# Patient Record
Sex: Female | Born: 1935
Health system: Southern US, Community
[De-identification: ages and names within clinical notes are randomized; demographics above are authoritative.]

## PROBLEM LIST (undated history)

## (undated) DIAGNOSIS — E785 Hyperlipidemia, unspecified: Secondary | ICD-10-CM

## (undated) DIAGNOSIS — M1990 Unspecified osteoarthritis, unspecified site: Secondary | ICD-10-CM

## (undated) DIAGNOSIS — K5790 Diverticulosis of intestine, part unspecified, without perforation or abscess without bleeding: Secondary | ICD-10-CM

## (undated) DIAGNOSIS — K219 Gastro-esophageal reflux disease without esophagitis: Secondary | ICD-10-CM

## (undated) DIAGNOSIS — I10 Essential (primary) hypertension: Secondary | ICD-10-CM

## (undated) DIAGNOSIS — I2510 Atherosclerotic heart disease of native coronary artery without angina pectoris: Secondary | ICD-10-CM

## (undated) DIAGNOSIS — M549 Dorsalgia, unspecified: Secondary | ICD-10-CM

## (undated) DIAGNOSIS — K429 Umbilical hernia without obstruction or gangrene: Secondary | ICD-10-CM

## (undated) HISTORY — PX: APPENDECTOMY: SHX54

## (undated) HISTORY — PX: RHINOPLASTY: SUR1284

## (undated) HISTORY — PX: TONSILLECTOMY: SHX5217

## (undated) HISTORY — PX: TONSILLECTOMY AND ADENOIDECTOMY: SUR1326

## (undated) HISTORY — PX: ABDOMINOPLASTY: SUR9

## (undated) HISTORY — PX: DECOMPRESSIVE LUMBAR LAMINECTOMY LEVEL 1: SHX5791

## (undated) HISTORY — PX: REPLACEMENT TOTAL KNEE BILATERAL: SUR1225

## (undated) HISTORY — DX: Gastro-esophageal reflux disease without esophagitis: K21.9

## (undated) HISTORY — PX: ABDOMINAL HYSTERECTOMY: SHX81

## (undated) HISTORY — PX: JOINT REPLACEMENT: SHX530

## (undated) HISTORY — PX: DECOMPRESSIVE LUMBAR LAMINECTOMY LEVEL 4: SHX5794

## (undated) HISTORY — PX: HERNIA REPAIR: SHX51

---

## 1999-11-12 ENCOUNTER — Other Ambulatory Visit: Admission: RE | Admit: 1999-11-12 | Discharge: 1999-11-30 | Payer: Self-pay | Admitting: *Deleted

## 2000-12-13 ENCOUNTER — Encounter: Payer: Self-pay | Admitting: Internal Medicine

## 2000-12-13 ENCOUNTER — Emergency Department (HOSPITAL_COMMUNITY): Admission: EM | Admit: 2000-12-13 | Discharge: 2000-12-13 | Payer: Self-pay | Admitting: Internal Medicine

## 2002-09-09 ENCOUNTER — Encounter: Payer: Self-pay | Admitting: Internal Medicine

## 2002-09-09 ENCOUNTER — Encounter: Admission: RE | Admit: 2002-09-09 | Discharge: 2002-09-09 | Payer: Self-pay | Admitting: Internal Medicine

## 2004-03-24 ENCOUNTER — Ambulatory Visit (HOSPITAL_COMMUNITY): Admission: RE | Admit: 2004-03-24 | Discharge: 2004-03-24 | Payer: Self-pay | Admitting: Orthopedic Surgery

## 2004-08-25 ENCOUNTER — Inpatient Hospital Stay (HOSPITAL_COMMUNITY): Admission: RE | Admit: 2004-08-25 | Discharge: 2004-08-28 | Payer: Self-pay | Admitting: Orthopedic Surgery

## 2004-08-25 ENCOUNTER — Ambulatory Visit: Payer: Self-pay | Admitting: Physical Medicine & Rehabilitation

## 2004-08-28 ENCOUNTER — Inpatient Hospital Stay
Admission: RE | Admit: 2004-08-28 | Discharge: 2004-09-02 | Payer: Self-pay | Admitting: Physical Medicine & Rehabilitation

## 2006-09-26 ENCOUNTER — Other Ambulatory Visit: Admission: RE | Admit: 2006-09-26 | Discharge: 2006-09-26 | Payer: Self-pay | Admitting: Cardiology

## 2006-10-25 ENCOUNTER — Encounter: Admission: RE | Admit: 2006-10-25 | Discharge: 2006-10-25 | Payer: Self-pay | Admitting: Internal Medicine

## 2006-11-08 ENCOUNTER — Encounter: Admission: RE | Admit: 2006-11-08 | Discharge: 2006-11-08 | Payer: Self-pay | Admitting: Internal Medicine

## 2007-01-09 ENCOUNTER — Inpatient Hospital Stay (HOSPITAL_COMMUNITY): Admission: RE | Admit: 2007-01-09 | Discharge: 2007-01-11 | Payer: Self-pay | Admitting: Obstetrics and Gynecology

## 2007-01-09 ENCOUNTER — Encounter (INDEPENDENT_AMBULATORY_CARE_PROVIDER_SITE_OTHER): Payer: Self-pay | Admitting: Specialist

## 2009-02-18 ENCOUNTER — Encounter: Admission: RE | Admit: 2009-02-18 | Discharge: 2009-02-18 | Payer: Self-pay | Admitting: Internal Medicine

## 2010-12-02 ENCOUNTER — Other Ambulatory Visit: Payer: Self-pay | Admitting: Orthopedic Surgery

## 2010-12-02 ENCOUNTER — Ambulatory Visit (HOSPITAL_COMMUNITY)
Admission: RE | Admit: 2010-12-02 | Discharge: 2010-12-02 | Disposition: A | Discharge status: Home / Self Care | Payer: Medicare Other | Source: Ambulatory Visit | Attending: Orthopedic Surgery | Admitting: Orthopedic Surgery

## 2010-12-02 ENCOUNTER — Other Ambulatory Visit (HOSPITAL_COMMUNITY): Payer: Self-pay | Admitting: Orthopedic Surgery

## 2010-12-02 ENCOUNTER — Encounter (HOSPITAL_COMMUNITY): Payer: Medicare Other

## 2010-12-02 DIAGNOSIS — Z01818 Encounter for other preprocedural examination: Secondary | ICD-10-CM | POA: Insufficient documentation

## 2010-12-02 DIAGNOSIS — I10 Essential (primary) hypertension: Secondary | ICD-10-CM | POA: Insufficient documentation

## 2010-12-02 DIAGNOSIS — Z01812 Encounter for preprocedural laboratory examination: Secondary | ICD-10-CM | POA: Insufficient documentation

## 2010-12-02 DIAGNOSIS — Z87891 Personal history of nicotine dependence: Secondary | ICD-10-CM | POA: Insufficient documentation

## 2010-12-02 LAB — CBC
Hemoglobin: 12.8 g/dL (ref 12.0–15.0)
MCH: 30.3 pg (ref 26.0–34.0)
Platelets: 307 10*3/uL (ref 150–400)
RBC: 4.23 MIL/uL (ref 3.87–5.11)
WBC: 6 10*3/uL (ref 4.0–10.5)

## 2010-12-02 LAB — URINE MICROSCOPIC-ADD ON

## 2010-12-02 LAB — COMPREHENSIVE METABOLIC PANEL
ALT: 13 U/L (ref 0–35)
AST: 16 U/L (ref 0–37)
Albumin: 4.3 g/dL (ref 3.5–5.2)
CO2: 27 mEq/L (ref 19–32)
Calcium: 10.4 mg/dL (ref 8.4–10.5)
Creatinine, Ser: 0.96 mg/dL (ref 0.4–1.2)
GFR calc Af Amer: >60 mL/min (ref >60)
Sodium: 136 mEq/L (ref 135–145)
Total Protein: 7.3 g/dL (ref 6.0–8.3)

## 2010-12-02 LAB — APTT: aPTT: 28 seconds (ref 24–37)

## 2010-12-02 LAB — PROTIME-INR
INR: 0.95 (ref 0.00–1.49)
Prothrombin Time: 12.9 seconds (ref 11.6–15.2)

## 2010-12-02 LAB — URINALYSIS, ROUTINE W REFLEX MICROSCOPIC
Glucose, UA: NEGATIVE mg/dL
Hgb urine dipstick: NEGATIVE
Protein, ur: NEGATIVE mg/dL
pH: 5 (ref 5.0–8.0)

## 2010-12-13 ENCOUNTER — Inpatient Hospital Stay (HOSPITAL_COMMUNITY)
Admission: RE | Admit: 2010-12-13 | Discharge: 2010-12-16 | DRG: 470 | Disposition: A | Discharge status: Skilled Nursing Facility | Payer: Medicare Other | Source: Ambulatory Visit | Attending: Orthopedic Surgery | Admitting: Orthopedic Surgery

## 2010-12-13 DIAGNOSIS — E785 Hyperlipidemia, unspecified: Secondary | ICD-10-CM | POA: Diagnosis present

## 2010-12-13 DIAGNOSIS — Z01812 Encounter for preprocedural laboratory examination: Secondary | ICD-10-CM

## 2010-12-13 DIAGNOSIS — K219 Gastro-esophageal reflux disease without esophagitis: Secondary | ICD-10-CM | POA: Diagnosis present

## 2010-12-13 DIAGNOSIS — E876 Hypokalemia: Secondary | ICD-10-CM | POA: Diagnosis not present

## 2010-12-13 DIAGNOSIS — D62 Acute posthemorrhagic anemia: Secondary | ICD-10-CM | POA: Diagnosis not present

## 2010-12-13 DIAGNOSIS — E669 Obesity, unspecified: Secondary | ICD-10-CM | POA: Diagnosis present

## 2010-12-13 DIAGNOSIS — Z96659 Presence of unspecified artificial knee joint: Secondary | ICD-10-CM

## 2010-12-13 DIAGNOSIS — I10 Essential (primary) hypertension: Secondary | ICD-10-CM | POA: Diagnosis present

## 2010-12-13 DIAGNOSIS — M1710 Unilateral primary osteoarthritis, unspecified knee: Principal | ICD-10-CM | POA: Diagnosis present

## 2010-12-13 DIAGNOSIS — E871 Hypo-osmolality and hyponatremia: Secondary | ICD-10-CM | POA: Diagnosis not present

## 2010-12-13 LAB — ABO/RH: ABO/RH(D): O POS

## 2010-12-13 LAB — TYPE AND SCREEN
ABO/RH(D): O POS
Antibody Screen: NEGATIVE

## 2010-12-14 LAB — CBC
MCV: 90 fL (ref 78.0–100.0)
Platelets: 235 10*3/uL (ref 150–400)
RBC: 3.41 MIL/uL — ABNORMAL LOW (ref 3.87–5.11)
RDW: 13.1 % (ref 11.5–15.5)
WBC: 6.1 10*3/uL (ref 4.0–10.5)

## 2010-12-14 LAB — BASIC METABOLIC PANEL
BUN: 12 mg/dL (ref 6–23)
Chloride: 100 mEq/L (ref 96–112)
Creatinine, Ser: 0.71 mg/dL (ref 0.4–1.2)
GFR calc Af Amer: >60 mL/min (ref >60)
GFR calc non Af Amer: >60 mL/min (ref >60)
Potassium: 4.1 mEq/L (ref 3.5–5.1)

## 2010-12-15 DIAGNOSIS — M1710 Unilateral primary osteoarthritis, unspecified knee: Secondary | ICD-10-CM

## 2010-12-15 DIAGNOSIS — Z96659 Presence of unspecified artificial knee joint: Secondary | ICD-10-CM

## 2010-12-15 LAB — BASIC METABOLIC PANEL
Chloride: 96 mEq/L (ref 96–112)
GFR calc non Af Amer: >60 mL/min (ref >60)
Potassium: 3.3 mEq/L — ABNORMAL LOW (ref 3.5–5.1)
Sodium: 132 mEq/L — ABNORMAL LOW (ref 135–145)

## 2010-12-15 LAB — CBC
HCT: 27.3 % — ABNORMAL LOW (ref 36.0–46.0)
MCV: 89.5 fL (ref 78.0–100.0)
Platelets: 216 10*3/uL (ref 150–400)
RBC: 3.05 MIL/uL — ABNORMAL LOW (ref 3.87–5.11)
RDW: 12.8 % (ref 11.5–15.5)
WBC: 6.5 10*3/uL (ref 4.0–10.5)

## 2010-12-15 NOTE — H&P (Addendum)
[name], [name]               ACCOUNT NO.:  [id number]  MEDICAL RECORD NO.:  [id number]           PATIENT TYPE:  I  LOCATION:  1604                         FACILITY:  [facility]  PHYSICIAN:  Ollen Gross, M.D.    DATE OF BIRTH:  March 01, 1935  DATE OF ADMISSION:  12/13/2010 DATE OF DISCHARGE:                             HISTORY & PHYSICAL   CHIEF COMPLAINT:  Left knee pain.  BRIEF HISTORY:  Cindy Patterson has been followed by Dr. Lequita Halt for worsening pain in her left knee.  Cindy Patterson has previously had a right total knee arthroplasty several years ago and Cindy Patterson is doing great with that. Cindy Patterson has had multiple injections in the left knee by Dr. Penni Bombard.  They helped initially, but unfortunately they started to wear off and Cindy Patterson is no longer getting any relief from injections.  Cindy Patterson is having increased pain and this is limiting what Cindy Patterson is able to do.  Cindy Patterson now presents for a left total knee arthroplasty.  ALLERGIES:  CODEINE.  Cindy Patterson has sensitivity, this causes nausea and vomiting.  PRIMARY CARE PHYSICIAN:  Georgianne Fick, MD.  CURRENT MEDICATIONS: 1. Lisinopril hydrochlorothiazide. 2. Lipitor. 3. Vitamin D. 4. Calcium. 5. Aleve. 6. Advil. 7. Tramadol. 8. Omeprazole.  Please note Cindy Patterson only takes Aleve or Advil, does not     take them together.  PAST MEDICAL HISTORY: 1. End-stage arthritis of the left knee. 2. Hypertension. 3. Hyperlipidemia. 4. Reflux disease. 5. Arthritis.  PAST SURGICAL HISTORY: 1. Tonsillectomy and adenoidectomy. 2. Appendectomy. 3. Rhinoplasty. 4. Abdominoplasty. 5. Right knee arthroscopy x2. 6. Right total knee arthroplasty. 7. Bladder tack. 8. Lumbar decompression.  FAMILY HISTORY:  Father passed at the age of 64, he had a myocardial infarction.  Mother passed at the age of 13, Cindy Patterson had COPD.  SOCIAL HISTORY:  The patient is widowed.  Cindy Patterson works as a Engineer, civil (consulting).  Cindy Patterson admits to past use of tobacco products.  Cindy Patterson has 1-2 glasses of wine daily.  Cindy Patterson has 3  children, 2 living.  Cindy Patterson plans to go to [facility] following her hospital stay.  REVIEW OF SYSTEMS:  GENERAL:  Negative for fevers, chills or weight change.  HEENT:  NEURO:  Negative for headache, blackout spells or insomnia.  DERMATOLOGIC:  Negative for rash or lesion.  RESPIRATORY: Negative for shortness of breath at rest or with exertion. CARDIOVASCULAR:  Negative for chest pain or palpitations.  GI:  Negative for nausea, vomiting or diarrhea.  GU:  Negative hematuria or dysuria. MUSCULOSKELETAL:  Positive for joint pain and joint swelling.  Ms. Selke has been cleared for surgery by Dr. Nicholos Johns.  PHYSICAL EXAMINATION:  VITAL SIGNS:  Pulse 80, respirations 18, blood pressure 138/82 in the left arm. GENERAL:  [name] is alert and oriented x3.  Cindy Patterson is well developed and well nourished, in no apparent distress.  Cindy Patterson is a pleasant 76 year old female.  Cindy Patterson has a stated height of 5 feet 3-1/2 inches and a stated weight of 219 pounds. HEENT:  Normocephalic, atraumatic.  Extraocular movements intact. NECK:  Supple.  Full range of motion without lymphadenopathy. CHEST:  Lungs are clear to  auscultation bilaterally. HEART:  Regular rate and rhythm without murmur. ABDOMEN:  Bowel sounds present in all 4 quadrants. EXTREMITIES:  Left knee negative for effusion, varus deformity.  Range is 5-110 degrees.  There is marked crepitus throughout the range.  No instability is noted. SKIN:  Unremarkable. NEUROLOGIC:  Intact. PERIPHERAL VASCULAR:  Carotid pulses 2+ bilaterally without bruit.  RADIOGRAPHY:  AP and lateral views of the patient's left knee reveal advanced end-stage arthritis tricompartmentally.  IMPRESSION:  Advanced end-stage arthritis tricompartmentally.  PLAN:  Left total knee arthroplasty to be performed by Dr. Lequita Halt.     Rozell Searing, PAC   ______________________________ Ollen Gross, M.D.   LD/MEDQ  D:  12/15/2010  T:  12/15/2010  Job:   [id number]  Electronically Signed by Ollen Gross M.D. on 12/15/2010 09:53:40 AM Electronically Signed by Rozell Searing  on 12/16/2010 08:37:41 AM

## 2010-12-15 NOTE — Op Note (Signed)
Cindy Patterson, Cindy Patterson               ACCOUNT NO.:  [id number]  MEDICAL RECORD NO.:  [id number]           PATIENT TYPE:  I  LOCATION:  1604                         FACILITY:  [facility]  PHYSICIAN:  Ollen Gross, M.D.    DATE OF BIRTH:  1935/01/26  DATE OF PROCEDURE:  12/13/2010 DATE OF DISCHARGE:                              OPERATIVE REPORT   PREOPERATIVE DIAGNOSIS:  Osteoarthritis, left knee.  POSTOPERATIVE DIAGNOSIS:  Osteoarthritis, left knee.  PROCEDURE:  Left total knee arthroplasty.  SURGEON:  Ollen Gross, MD  ASSISTANT:  Alexzandrew L. Perkins, PA-C  ANESTHESIA:  Spinal.  ESTIMATED BLOOD LOSS:  Minimal.  DRAIN:  Hemovac x1.  TOURNIQUET TIME:  35 minutes at 300 mmHg.  COMPLICATIONS:  None.  CONDITION:  Stable to Recovery.  BRIEF CLINICAL NOTE:  Cindy Patterson is a 76 year old female with advanced end- stage arthritis of the left knee with progressively worsening pain and dysfunction.  She has had a previous successful right total knee arthroplasty and she presents now for left total knee arthroplasty.  PROCEDURE IN DETAIL:  After successful administration of spinal anesthetic, a tourniquet was placed high on her left thigh and her left lower extremity was prepped and draped in usual sterile fashion. Extremity was wrapped in an Esmarch, knee flexed, tourniquet inflated to 300 mmHg.  Midline incision was made with a 10 blade through a subcutaneous tissue to the level of the extensor mechanism.  A fresh blade was used to make a medial parapatellar arthrotomy.  Soft tissue on the proximal medial tibia was subperiosteally elevated to the joint line with a knife and into the semimembranosus bursa with a Cobb elevator. Soft tissue laterally was elevated with attention being paid to avoid any patellar tendon on tibial tubercle.  The patella was everted, knee flexed to 90 degrees, and ACL and PCL removed.  Drill was used to create a starting hole in the distal femur and the  canal was thoroughly irrigated.  The 5-degree left valgus alignment guide was placed.  Distal femoral cutting block was then pinned to remove 11 mm of the distal femur.  Resection was made with an oscillating saw.  Tibia subluxed forward and menisci removed.  Extramedullary tibial alignment guide was placed referencing proximally at the medial aspect of the tibial tubercle and distally along the second metatarsal axis and tibial crest.  Block was pinned to remove 2 mm off the more deficient medial side.  Tibial resection was made with an oscillating saw.  Size 3 was the most appropriate tibial component and proximal tibia prepared with a modular drill and keel punched for the size 3.  Femoral sizing guide was placed, size 4 narrow was most appropriate. The cutting block was placed with the rotation marked in the epicondylar axis and also by creating rectangular flexion gap at 90 degrees.  The block was pinned and the anterior and posterior chamfer cuts were made. Intercondylar block was placed and that cut was made.  Trial size 4 narrow posterior stabilized femur was placed.  A 10-mm posterior stabilized rotating platform insert trial was placed.  There was a tiny bit of varus-valgus play,  so I went to a 12.5 which was allowed for full extension with excellent varus-valgus and anterior-posterior stability, throughout full range of motion.  The patella was then everted, thickness measured to be 22 mm.  Freehand resection taken to 12 mm, 38 template was placed, lug holes were drilled, trial patella was placed and it tracked normally.  Osteophytes were removed off the posterior femur with the trial in place.  All trials were removed and the cut bone surfaces were prepared with pulsatile lavage.  Cement was mixed and once ready for implantation, the size 3 mobile-bearing tibial tray, size 4 narrow posterior stabilized femur, and 38 patella were cemented into place and patella was held with  a clamp.  Trial 12.5-mm insert was placed, knee held in full extension, all extruded cement removed.  When the cement was fully hardened, then the permanent 12.5-mm posterior stabilized rotating platform insert was placed into the tibial tray. Wound was copiously irrigated with saline solution and then the arthrotomy closed over Hemovac drain with interrupted #1 PDS.  Flexion against gravity was 135 degrees.  Patella tracked normally. Tourniquet was released at total time of 35 minutes.  Subcutaneous was closed with interrupted 2-0 Vicryl and subcuticular with running 4-0 Monocryl. Catheter for the Marcaine pain pump was placed and the pump was initiated.  Incision was cleaned and dried and Steri-Strips and a bulky sterile dressing were applied.  She was then placed into a knee immobilizer, awakened, and transported to Recovery in stable condition.     Ollen Gross, M.D.     FA/MEDQ  D:  12/13/2010  T:  12/14/2010  Job:  [id number]  Electronically Signed by Ollen Gross M.D. on 12/15/2010 09:53:37 AM

## 2010-12-16 LAB — CBC
HCT: 26.4 % — ABNORMAL LOW (ref 36.0–46.0)
Hemoglobin: 8.8 g/dL — ABNORMAL LOW (ref 12.0–15.0)
MCV: 89.5 fL (ref 78.0–100.0)
WBC: 6.3 10*3/uL (ref 4.0–10.5)

## 2010-12-16 LAB — BASIC METABOLIC PANEL
BUN: 7 mg/dL (ref 6–23)
CO2: 28 mEq/L (ref 19–32)
Chloride: 100 mEq/L (ref 96–112)
Glucose, Bld: 104 mg/dL — ABNORMAL HIGH (ref 70–99)
Potassium: 3.8 mEq/L (ref 3.5–5.1)
Sodium: 132 mEq/L — ABNORMAL LOW (ref 135–145)

## 2010-12-27 NOTE — Discharge Summary (Signed)
Cindy Patterson, Cindy Patterson               ACCOUNT NO.:  [id number]  MEDICAL RECORD NO.:  [id number]           PATIENT TYPE:  I  LOCATION:  1604                         FACILITY:  [facility]  PHYSICIAN:  Ollen Gross, M.D.    DATE OF BIRTH:  [birth year]  DATE OF ADMISSION:  12/13/2010 DATE OF DISCHARGE:  12/16/2010                        DISCHARGE SUMMARY - REFERRING   ADMITTING DIAGNOSES: 1. Osteoarthritis, left knee. 2. Hypertension. 3. Hyperlipidemia. 4. Reflux. 5. Osteoarthritis.  DISCHARGE DIAGNOSES: 1. Osteoarthritis, left knee status post left total knee replacement     arthroplasty. 2. Postop acute blood loss anemia did not require transfusion. 3. Mild postop hyponatremia. 4. Mild postop hypokalemia. 5. Osteoarthritis, left knee. 6. Hypertension. 7. Hyperlipidemia. 8. Reflux. 9. Osteoarthritis.  PROCEDURE:  On December 13, 2010, left total knee.  SURGEON:  Dr. Lequita Halt.  ASSISTANT:  Alexzandrew L. Perkins, P.A.C.  ANESTHESIA:  Spinal anesthesia.  CONSULTATIONS:  [facility].  BRIEF HISTORY:  Cindy Patterson is a 76 year old female with advanced end-stage arthritis of left knee, progressive worsening pain dysfunction, and successful right total knee now presents for a left total knee.  LABORATORY DATA:  Preop CBC showed a hemoglobin of 12.8, hematocrit of 38.7, white cell count of 6.0, and platelets of 307.  PT/INR 12.9 and 0.95 with a PTT of 28.  Chem panel on admission minimally elevated BUN of 28.  Remaining Chem panel all within normal limits.  Preop UA small leukocytes, rare squamous, and 0 to 2 white cells.  Blood group type O+. Nasal swabs were positive for Staphylococcus aureus, but negative for MRSA.  Serial CBCs were followed throughout the hospital course. Hemoglobin dropped down to 10.1 to 9.  Last noted hemoglobin and hematocrit was 8.8 and 26.4.  Serial BMET were followed.  Sodium dropped from 136 to 132 were stabilized that was last noted at  132, potassium dropped from 4.2 to 3.3 was back up to 3.8 with potassium supplements. Remaining electrolytes remained within normal limits.  EKG on the chart dated 2012, cannot make out the month, sinus rhythm, poor R wave progression probably normal variant, no major changes.  HOSPITAL COURSE:  The patient admitted to [facility] and taken to OR, underwent above-stated procedure without complication.  The patient tolerated the procedure well, later transferred to the recovery room on 5th floor, started on p.o. and IV analgesics, pain control following surgery, and doing fairly well on the morning of day one, had a good urinary output.  Sodium was a little low, felt to be more of a dilutional component.  She was on a fluid pill for blood pressure control also and her initial sodium was only 136 on the lower level. She wanted to look into a skilled facility versus a [facility] so we got a [facility].  The patient was seen on postop day #2 by rehab services and felt that she did not meet the medical necessity for the inpatient rehab and felt she would be a good skilled facility patient.  We had the social work also involved to assist with placement of the patient by day  2.  Her hemoglobin was down a little bit.  She was asymptomatic with this hemoglobin of 9.  She was started on iron supplementation.  It was also noted that the potassium was a little low and so we put her on some potassium supplements. Dressing was changed.  Incision looked good.  She did well, progressing, and by day 3 potassium was back up.  Her sodium was stable.  It was noted that a bed should be available over at [facility] on 12/16/2010. Arrangement should be made.  She will transfer by that time.  DISCHARGE PLAN: 1. The patient is transferred over to [facility] on 12/16/2010. 2. Discharge diagnoses please see above. 3. Discharge medications and current medications  at time of transfer     include:  MEDICATIONS: 1. Nu-Iron 150 mg p.o. daily for 3 weeks and discontinue the Nu-Iron. 2. Lipitor 10 mg at bedtime. 3. Hydrochlorothiazide 25 mg every morning. 4. Xarelto 10 mg daily for 2 weeks then discontinue the Xarelto. 5. Colace 100 mg p.o. b.i.d. 6. Omeprazole 20 mg every morning. 7. Tylenol 325 one or two every 4 to 6 hours as needed for mild pain,     temperature, or headache. 8. Laxative of choice. 9. Enema of choice. 10.Robaxin 500 mg p.o. q.6-8 h. p.r.n. spasm. 11.Restoril 15 mg 1 or 2 every hour or p.r.n., sleep insomnia. 12.Percocet 5 mg 1 or 2 every 4-6 hours as needed for moderate pain.  DIET:  Heart-healthy diet.  ACTIVITY:  She is a left total knee arthroplasty.  She is weightbearing as tolerated for total knee protocol.  PT and OT for gait training, ambulation, ADLs, range of motion, and strengthening exercises.  Please note the patient may start showering, however, do not submerge the incision under water, daily dressing changed to the knee.  FOLLOWUP:  She needs to follow up with Dr. Lequita Halt in the office approximately 2 weeks from the date of surgery either on May 1st on Tuesday or May 3rd on Thursday, please contact the office at [phone] and help arrange appointment followup care of this patient.  DISPOSITION:  [facility].  CONDITION ON DISCHARGE:  Improved.     Alexzandrew L. Julien Girt, P.A.C.   ______________________________ Ollen Gross, M.D.    ALP/MEDQ  D:  12/16/2010  T:  12/16/2010  Job:  [id number]  cc:   Georgianne Fick, M.D. Fax: [phone]  Electronically Signed by Patrica Duel P.A.C. on 12/16/2010 11:22:12 AM Electronically Signed by Ollen Gross M.D. on 12/27/2010 07:11:05 AM

## 2011-01-14 NOTE — Discharge Summary (Signed)
NAMEMARELLY, WEHRMAN NO.:  [id number]   MEDICAL RECORD NO.:  [id number]          PATIENT TYPE:  ORB   LOCATION:  4529                         FACILITY:  [facility]   PHYSICIAN:  Ranelle Oyster, M.D.DATE OF BIRTH:  [birth year]   DATE OF ADMISSION:  08/28/2004  DATE OF DISCHARGE:  09/02/2004                                 DISCHARGE SUMMARY   DISCHARGE DIAGNOSES:  1.  Right total knee replacement.  2.  Postoperative anemia.  3.  Hypertension.   HISTORY OF PRESENT ILLNESS:  Ms. Kriegel is a 76 year old female with history  of hypertension, GERD, OA of right knee with end-stage changes.  She elected  to undergo right total knee replacement on December 28, but Dr. Lequita Halt with  weightbearing as tolerated and was started on Coumadin for DVT prophylaxis.  Therapies initiated and the patient has been progressing along.  She is  currently moderate assist for bed mobility and transfer minimal assist  ambulating 3 feet with a rolling walker.  She requires assist with upper  body care, moderate to maximum assist for lower body care.  SACU was  consulted of progress.   PAST MEDICAL HISTORY:  1.  Hypertension.  2.  Right knee scope in 1966, and July 2005.  3.  Rhinoplasty.  4.  Appendectomy.  5.  Gastroesophageal reflux disease.  6.  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   ALLERGIES:  CODEINE.   SOCIAL HISTORY:  The patient lives with family in one-level home with two  steps at entry.  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  She does not have tobacco  abuse and alcohol occasionally.  She continues to work on a part-time basis.   HOSPITAL COURSE:  Ms. Gullickson was admitted to subacute on August 28, 2004,  for SACU level therapies to consist of PT/OT daily.  Past admission, subcu  Lovenox was added and INR was therapeutic at 1.3.  Blood pressures were  monitored as the patient was on hydrochlorothiazide and Prinivil.  BPs were  noted to be running on the low side with systolics 100-110  and her  lisinopril was cut down to 10 mg a day.  Labs done past admission revealed  sodium 131, potassium 3.9, chloride 97, CO2 26, BUN 19, creatinine 0.9,  glucose 123.  Check of CBC with hemoglobin 10.9, hematocrit 31.6, white  count 6.6, platelets 355.   The patient's right knee incision has been monitored along.  It has been  healing well without any signs or symptoms of infection.  It is intact  without any drainage.  The patient has tolerated in her therapies and has  progressed along well.  At the time of discharge, the patient is at modified  independent levels for transfers, modified independent levels for ambulating  200 feet with rolling walker.  She has supervision car transfers.  She is  modified independent for ADLs.  Further followup therapies to include home  health, PT/OT by [facility].  Home health R.N. has been  arranged for next protime draw on January 9, with Coumadin to continue to  September 25, 2004.  On September 02, 2004, the patient is discharged to home.   DISCHARGE MEDICATIONS:  1.  Coumadin 2.5 mg alternating with 5 mg every other day.  2.  Prinivil 10 mg a day.  3.  Lipitor 10 mg q.h.s.  4.  Hydrochlorothiazide 25 mg a day.  5.  Robaxin 500 mg q.i.d. p.r.n. spasms.  6.  Oxycodone 5-10 mg q.4-6h. p.r.n. pain.   ACTIVITY:  Use walker.   DIET:  Regular.   WOUND CARE:  Wash with soap and water.  Keep clean and dry.   SPECIAL INSTRUCTIONS:  No alcohol, no smoking and no driving.   FOLLOW UP:  The patient is to follow up with Dr. Lequita Halt in 2 weeks.  Follow  up with Dr. Nicholos Johns.  Follow up with Dr. Riley Kill as needed.      Pame   PP/MEDQ  D:  09/02/2004  T:  09/02/2004  Job:  [id number]   cc:   Ollen Gross, M.D.  [facility]  [address]  [address]  [place]  [place] [address]  Fax: [phone]   Georgianne Fick, M.D.  [address]  [place]  [place] [address]  Fax: [phone]

## 2011-01-14 NOTE — Op Note (Signed)
Cindy Patterson, Cindy Patterson NO.:  [id number]   MEDICAL RECORD NO.:  [id number]          PATIENT TYPE:  INP   LOCATION:  0005                         FACILITY:  [facility]   PHYSICIAN:  Ollen Gross, M.D.    DATE OF BIRTH:  August 10, 1935   DATE OF PROCEDURE:  08/25/2004  DATE OF DISCHARGE:                                 OPERATIVE REPORT   PREOPERATIVE DIAGNOSIS:  Osteoarthritis, right knee.   POSTOPERATIVE DIAGNOSIS:  Osteoarthritis, right knee.   PROCEDURE:  Right total knee arthroplasty.   SURGEON:  Ollen Gross, M.D.   ASSISTANT:  Ranee Gosselin, MD.   ANESTHESIA:  Spinal.   ESTIMATED BLOOD LOSS:  Minimal.   DRAINS:  Hemovac x1.   TOURNIQUET TIME:  Sixty minutes at 300 mmHg.   COMPLICATIONS:  None.   CONDITION:  Stable to the recovery room.   CLINICAL NOTE:  Cindy Patterson is a 76 year old female who has severe end-stage  arthritis of the right knee with intractable pain.  She presents now for  right total knee arthroplasty.   PROCEDURE IN DETAIL:  After successful administration of spinal anesthetic,  a tourniquet is placed high on her right thigh and her right lower extremity  prepped and draped in the usual sterile fashion.  The extremity is wrapped  in esmarch, the knee flexed, and the tourniquet inflated to 300 mmHg.  A  standard midline incision was made with a 10 blade through the subcutaneous  tissue to the level of the extensor mechanism.  A fresh blade is used to  make a medial parapatellar arthrotomy, and the soft tissue over the proximal  medial tibia is subperiosteally elevated to the joint line with a knife and  into the semimembranosus bursa with a Cobb elevator.  The soft tissue over  the proximal lateral tibia is also elevated with attention being paid to  avoid the patellar tendon on the tibial tubercle.  The patella is everted,  and the knee flexed to 90 degrees.  ACL and PCL were removed.  The drill is  used to create a starting hole, and the  distal femoral canal is irrigated.  A 5 degree right valgus alignment guide is placed and referencing off the  posterior condyle, rotation is marked and the knee blocked, pinned to remove  10 mm off the distal femur.  Distal femoral resection is made with an  oscillating saw.  A sizing block is placed, and size 4 is most appropriate  for the femur.  Rotation is marked off the epicondylar axis.  A size 4  cutting block is placed.  The anterior and posterior chamfer cuts are made.   The tibia is subluxed forward, and the menisci are removed.  An  extramedullary tibial alignment guide is placed, referencing proximally at  the medial aspect of the tibial tubercle and distally along the second  metatarsal axis and tibial crest.  A block is pinned to remove 10 mg off the  nondeficient lateral side.  Tibial resection is made with an oscillating  saw.  The size 3 is the most appropriate tibial component, and the  proximal  tibia is prepared with a modular drill and keel punch for a size 3.  Femoral  preparation is completed with the intercondylar cut and chamfer cuts.   A size 4 posterior stabilized femoral trial with a size 3 mobile-bearing  tibial trial and a 10 mm posterior stabilized rotating platform insert trial  was placed.  With the 10, full extension is achieved with excellent varus  and valgus balance throughout full range of motion.  The patella is then  everted, and thickness measured to be 23 mm.  Free-hand resection is taken  to 14 mm.  A 38 template is placed.  Lug holes are drilled.  The trial  patella is placed, and it tracks normally.  The osteophytes are then removed  off the posterior femur with a trial in place.  All trials were removed,  then the cut-bone surfaces are prepared with pulsatile lavage.  Cement is  mixed, and once ready for implantation, a size 3 mobile-bearing tibial  trial, size 4 posterior stabilized femur, and 38 patella are cemented into  place.  The patella  is held with a clamp.  A trial 10 mm insert is placed.  The knee held in full extension, and all extruded cement is removed.  Once  the cement is fully hardened, then the permanent 10 mm posterior stabilized  rotating platform insert is placed into the tibial tray.  The wound is  copiously irrigated with saline solution.  The extensor mechanism closed  over a Hemovac drain with interrupted #1 PDS.  The tourniquet is released  for a total time of 60 minutes.  Flexion against gravity is 125 degrees.  The subcu is closed with interrupted 2-0 Vicryl and the subcuticular with a  running 4-0 Monocryl.  The incision is clean and dry.  Steri-Strips and a  bulky sterile dressing applied.  The drain is hooked to suction.  She is  placed into a knee immobilizer, awakened and transported to recovery in  stable condition.     Drenda Freeze   FA/MEDQ  D:  08/25/2004  T:  08/25/2004  Job:  [id number]

## 2011-01-14 NOTE — H&P (Signed)
NAME:  Cindy Patterson, Cindy Patterson NO.:  [id number]   MEDICAL RECORD NO.:  [id number]          PATIENT TYPE:  AMB   LOCATION:  SDC                           FACILITY:  [facility]   PHYSICIAN:  Randye Lobo, M.D.   DATE OF BIRTH:  March 21, 1935   DATE OF ADMISSION:  DATE OF DISCHARGE:                              HISTORY & PHYSICAL   CHIEF COMPLAINT:  Bladder and uterine prolapse and urinary incontinence.   HISTORY OF PRESENT ILLNESS:  The patient is a 76 year old, gravida 36,  para 4-0-2-3 Caucasian female who presents with bladder and uterine  prolapse and leakage of urination.  The patient has noted progressive  worsening of uterine and bladder prolapse.  She is experiencing leakage  of urine with stressful maneuvers such as coughing or standing up. She  does have a history of some urinary urgency.  The patient has been  treated in the past with anticholinergic therapy.  Recent urodynamic  testing on December 04, 2006, documented the presence of genuine stress  incontinence with a leak point pressure of 91 cm of water.  The  cystometric studies showed no evidence of detrusor instability at that  time.  The patient's maximum detrusor pressure with her pressure flow  study was 27 cm of water.   The patient would like surgical treatment of her prolapse and  incontinence.   PAST OBSTETRIC AND GYNECOLOGIC HISTORY:  The patient is status post  spontaneous vaginal delivery x4.  The largest child weighed 7 pounds 8  ounces.  She has a history of two spontaneous abortions.  The patient is  not currently using hormone replacement therapy.  The last Pap smear was  performed in January 2008 and was  within normal limits.  Her last  mammogram was performed recently where she was noted to have a benign  finding on the left breast with plan for followup in 6 months.   PAST MEDICAL HISTORY:  1. Hypertension.  2. Gastroesophageal reflux disease.  3. Hyperlipidemia.   PAST SURGICAL HISTORY:  1.  Status post tonsillectomy.  2. Status post rhinoplasty in 1964.  3. Status post appendectomy in 1954.  4. Status post abdominoplasty in 1984  5. Status post blepharoplasty in 1985.  6. Status post right knee replacement in 2005.   MEDICATIONS:  1. Lisinopril 20 mg p.o. daily.  2. Hydrochlorothiazide 25 mg p.o. daily.  3. Lipitor 10 mg p.o. daily.  4. Prilosec over-the-counter 20 mg p.o. daily.  5. Zoloft 50 mg p.o. daily.  6. Ativan 1 mg p.o. nightly p.r.n.  7. Multivitamins p.o. daily.  8. Aleve 1 capsule p.o. q. 12 h.  9. Melatonin 3 mg p.o. nightly.   ALLERGIES:  The patient has an intolerance to CODEINE which causes  nausea and vomiting.   FAMILY HISTORY:  There is no family history of any breast, uterine,  colon, or ovarian cancer.   PHYSICAL EXAMINATION:  VITAL SIGNS: Height 5 feet 4 inches.  Blood  pressure 131/80.  LUNGS: Clear to auscultation bilaterally.  HEART:  S1, S2 with regular rate and rhythm.  ABDOMEN:  Soft  and nontender.  There is evidence of a periumbilical  circumferential incision and a low Pfannenstiel type incision which  extends from one iliac crest to the other.  There is no evidence of  hepatosplenomegaly or organomegaly.  PELVIC:  Normal external genitalia and urethra.  The vagina and cervix  demonstrate no lesions.  There is evidence of first-degree uterine  prolapse.  There is a second to third-degree cystocele and a first-  degree rectocele.  The uterus is small and nontender, and there are no  adnexal masses or tenderness appreciated.  There are no anal lesions.   IMPRESSION:  1. The patient is a 76 year old female with incomplete uterovaginal      prolapse and evidence of genuine stress incontinence with      urodynamic testing.  2. The patient is status post recent knee replacement in 2005.   PLAN:  The patient will undergo a total vaginal hysterectomy with  anterior and posterior colporrhaphy, tension-free vaginal tape,  suburethral  sling, and cystoscopy at the [facility].  Risks, benefits, and alternatives have been discussed with the patient  who wishes to proceed. The patient will be treated with ampicillin and  gentamycin IV for preoperative antibiotics.      Randye Lobo, M.D.  Electronically Signed     BES/MEDQ  D:  01/08/2007  T:  01/08/2007  Job:  [id number]  NAME:  Cindy Patterson, Cindy Patterson               ACCOUNT NO.:  [id number]   MEDICAL RECORD NO.:  [id number]          PATIENT TYPE:  AMB   LOCATION:  SDC                           [facility]   PHYSICIAN:  Randye Lobo, M.D.   DATE OF BIRTH:  [birth year]   DATE OF ADMISSION:  DATE OF DISCHARGE:                              HISTORY & PHYSICAL   CHIEF COMPLAINT:  Bladder and uterine prolapse and urinary incontinence.   HISTORY OF PRESENT ILLNESS:  The patient is a 76 year old, gravida 54,  para 4-0-2-3 Caucasian female who presents with bladder and uterine  prolapse and leakage of urination.  The patient has noted progressive  worsening of uterine and bladder prolapse.  She is experiencing leakage  of urine with stressful maneuvers such as coughing or standing up. She  does have a history of some urinary urgency.  The patient has been  treated in the past with anticholinergic therapy.  Recent urodynamic  testing on December 04, 2006, documented the presence of genuine stress  incontinence with a leak point pressure of 91 cm of water.  The  cystometric studies showed no evidence of detrusor instability at that  time.  The patient's maximum detrusor pressure with her pressure closed  study was 27 cm of water.   The patient would like surgical treatment of her prolapse and  incontinence.   PAST OBSTETRIC AND GYNECOLOGIC HISTORY:  The patient is status post  spontaneous vaginal delivery x4.  The largest child weighed 7 pounds 8  ounces.  She has a history of two spontaneous abortions.  The patient is  not currently using hormone replacement  therapy.  The last Pap smear was  performed in January  2008 and was  within normal limits.  Her last  mammogram was performed recently where she was noted to have a benign  finding on the left breast with plan for followup in 6 months.   PAST MEDICAL HISTORY:  1. Hypertension.  2. Gastroesophageal reflux disease.  3. Hyperlipidemia.   PAST SURGICAL HISTORY:  1. Status post tonsillectomy.  2. Status post rhinoplasty in 1964.  3. Status post appendectomy in 1954.  4. Status post abdominoplasty in 1984  5. Status post blepharoplasty in 1985.  6. Status post right knee replacement in 2005.   MEDICATIONS:  1. Lisinopril 20 mg p.o. daily.  2. Hydrochlorothiazide 25 mg p.o. daily.  3. Lipitor 10 mg p.o. daily.  4. Prilosec over-the-counter 20 mg p.o. daily.  5. Zoloft 50 mg p.o. daily.  6. Ativan 1 mg p.o. nightly p.r.n.  7. Multivitamins p.o. daily.

## 2011-01-14 NOTE — Op Note (Signed)
Cindy Patterson, HACKERT NO.:  [id number]   MEDICAL RECORD NO.:  [id number]          PATIENT TYPE:  AMB   LOCATION:  SDC                           FACILITY:  [facility]   PHYSICIAN:  Randye Lobo, M.D.   DATE OF BIRTH:  March 15, 1935   DATE OF PROCEDURE:  01/09/2007  DATE OF DISCHARGE:                               OPERATIVE REPORT   PREOPERATIVE DIAGNOSES:  1. Incomplete uterovaginal prolapse.  2. Genuine stress incontinence.   POSTOPERATIVE DIAGNOSES:  1. Incomplete uterovaginal prolapse.  2. Genuine stress incontinence.   PROCEDURE:  Total vaginal hysterectomy, McCall culdoplasty, anterior and  posterior colporrhaphy, tension-free vaginal tape suburethral sling,  cystoscopy.   SURGEON:  Conley Simmonds, M.D.   ASSISTANT:  Lodema Hong, M.D.   ANESTHESIA:  General endotracheal, local with 0.5% lidocaine with  epinephrine 1:200,000.   IV FLUIDS:  Ringer's Lactate 1700 cc.   ESTIMATED BLOOD LOSS:  150 cc.   URINE OUTPUT:  Quantity sufficient.   COMPLICATIONS:  None.   INDICATIONS FOR PROCEDURE:  The patient is a 76 year old gravida 69, para  4-0-2-3 Caucasian female who presents with bladder and uterine prolapse  along with urinary incontinence.  The patient has had progression of her  symptoms and seeks surgical treatment.  The patient reports urinary  leakage with stressful maneuvers such as coughing and standing up.  The  patient has been treated in the past with anticholinergic for her  urinary urgency.  Urodynamic testing performed on December 04, 2006  documented the presence of genuine stress incontinence with a leak point  pressure of 91 cm of water.  On pelvic examination, the patient is noted  to have first-degree uterine prolapse, a second to third degree  cystocele and a first-degree rectocele.   The plan is made now to proceed with a total vaginal hysterectomy with a  vaginal vault suspension, anterior and posterior colporrhaphy and  tension-free  vaginal tape suburethral sling along with cystoscopy.  Risks, benefits, and alternatives have been reviewed with the patient  who wishes to proceed.   FINDINGS:  Exam under anesthesia revealed a second-degree cystocele and  first degree uterine prolapse.  There was a first-degree rectocele.  The  cervix demonstrated no lesions.  The uterus was noted to be small with a  long cervix.  The fallopian tubes and the ovaries were normal.   Cystoscopy during placement of the sling demonstrated the absence of a  foreign body in the urethra or the bladder.  The bladder was visualized  throughout 360 degrees and had a normal bladder dome and trigone.  The  ureters were noted to be patent bilaterally after the injection of  indigo carmine dye IV.   PROCEDURE:  The patient was reidentified in the preoperative holding  area.  She did receive ampicillin and gentamicin antibiotic prophylaxis  due to her recent knee replacement.  The patient received both TED hose  and PAS stockings for DVT prophylaxis.   In the operating room, the patient was placed in the supine position and  underwent general endotracheal anesthesia.  She was then placed in  the  dorsal lithotomy position.  The abdomen and vagina were then sterilely  prepped and draped.  A Foley catheter was left to gravity drainage.   The procedure began by placing a weighted speculum in the vagina and  placing a single-tooth tenaculum on the anterior and posterior cervical  lips.  The cervix was circumferentially injected with 0.5% lidocaine  with 1:200,000 of epinephrine.  The cervix was then circumscribed with a  scalpel.  An initial attempt was made to enter into the posterior cul-de-  sac although the cervix was quite long and this was not possible  initially.  The uterosacral ligaments were then sequentially clamped,  sharply divided, and suture ligated with transfixing sutures of 0  Vicryl.  A second clamp was placed along the uterosacral  ligaments on  either side.  Each of them was further divided and then suture ligated  with transfixing sutures of 0 Vicryl which were held for the culdoplasty  procedure.   The posterior cul-de-sac was entered sharply at this time, and digital  exam confirmed proper entry into this location.  A long weighted  speculum was then placed in the posterior cul-de-sac.  The bladder was  dissected off of the cervix in the midline anteriorly.  Each of the  bladder pillars were then clamped, sharply divided, and suture ligated  with 0 Vicryl.  The inferior aspects of the cardinal ligaments were then  clamped, sharply divided, and suture ligated with 0 Vicryl.  The bladder  was further dissected off of the cervix so that the remainder of the  cardinal ligaments could be clamped, sharply divided, and suture ligated  with 0 Vicryl.  There was good hemostasis.   The anterior cul-de-sac was then entered sharply with Metzenbaum  scissors and again digital exam confirmed proper entry into this  location.  The round ligaments were then clamped, sharply divided, and  suture ligated with 0 Vicryl bilaterally.  A tenaculum was then placed  on the uterine fundus and the uterus was inverted.  The upper pedicles  were then clamped, and sharply divided.  Each of the upper pedicles were  tied first with a free tie of 0 Vicryl followed by suture ligature of  the same.  Hemostasis was good.   The uterine specimen was sent to pathology.   The posterior vaginal cuff was then whip stitched with a running locked  suture of 0 Vicryl to provide good hemostasis.  There was a small amount  of bleeding along the peritoneum just above the cuff closure bilaterally  medial to the uterosacral ligaments.  Monopolar cautery created good  hemostasis.   The McCall culdoplasty was performed next.  The suture of 0 Vicryl was  brought through the vaginal cuff and into the posterior cul-de-sac at the 6 o'clock position, through  the distal left uterosacral ligament,  across the posterior cul-de-sac in a pursestring fashion, and then down  through the distal right uterosacral ligament before coming out of the  vagina again at the 6 o'clock position.  The suture was held until the  end of the case at which time it was tied to provide excellent support  and elevation of the vaginal cuff.   The pedicle sites from the hysterectomy were noted to be hemostatic at  this time.   The anterior colporrhaphy and sling were performed next.  Allis clamps  were used to mark the midline of the anterior vaginal mucosa.  The  mucosa was then injected with 0.5%  lidocaine with 1:200,000 of  epinephrine.  The vaginal mucosa was incised sharply in the midline  using Metzenbaum scissors.  The subvaginal tissue was dissected off of  the vaginal mucosa bilaterally.  The dissection was carried back to the  pubic rami with a combination of sharp and blunt dissection.  Hemostasis  was created with monopolar cautery.   The sling was performed next.  1 cm suprapubic incisions were created 3  cm to the right and the left of the midline.  The sling was performed in  a top-down fashion.  The abdominal needle passer was placed through the  right suprapubic incision and out through the vagina at the level of the  mid urethra and lateral to this.  The same procedure that was performed  on the right-hand side was then repeated on the left-hand side.  The  Foley catheter was removed and cystoscopy was performed at this time.  There was no evidence of a foreign body in either the urethra or the  bladder.  The abdominal needle passer on the patient's left-hand side  was noted to be slightly more medial than desirable although there was  no evidence of any entry of the abdominal needle passer into the bladder  or urethra.  The abdominal needle passer was therefore removed after the  cystoscopy fluid was drained from the bladder and the Foley  catheter  replaced.  The abdominal needle passer on the patient's left-hand side  was once again placed in a top-down fashion.  The Foley catheter was  removed and repeat cystoscopy was performed and the findings are as  noted above.  The sling was noted to be in satisfactory position.  The  bladder was once again drained of all cystoscopy fluid and the Foley  catheter was replaced.  The plastic sheaths were separated from the  surrounding mesh and a Kelly clamp was placed between the sling and the  urethra.  The plastic sheaths were then removed and the sling was noted  to be in good final position.  Excess sling was then trimmed  suprapubically.   The anterior colporrhaphy was performed with vertical mattress sutures  of 2-0 Vicryl for excellent reduction of the cystocele.  A simple  through-and-through suture of 2-0 Vicryl was placed at the exit site of  the patient's sling on the right-hand side along the vaginal wall.  This provided good hemostasis.  Excess anterior vaginal wall mucosa was then  excised and the anterior vaginal wall and the vaginal cuff were closed  with a running locked suture of 2-0 Vicryl.   The posterior colporrhaphy was performed finally.  Allis clamps were  used to mark the posterior vaginal wall in the midline.  The perineal  body and the posterior vaginal mucosa were then injected with 0.5%  lidocaine with 1:200,000 of epinephrine.  A triangular wedge of  epithelium was removed from the perineal body and the posterior vaginal  wall was incised vertically in the midline with a Metzenbaum scissors.  The perirectal fascia was dissected off of the vaginal mucosa  bilaterally.  The dissection was carried up to the top of the small  rectocele.  The posterior colporrhaphy was then performed with vertical  mattress sutures of a combination of 2-0 Vicryl and 0 Vicryl closer to  the perineal body.  Excess vaginal mucosa was then trimmed and the  posterior vaginal  wall was closed with a running lock suture of 2-0  Vicryl which continued along the  perineal body in a subcuticular fashion  as for an episiotomy.  Rectal exam was performed at this time and there  was no evidence of any sutures in the rectum.   The McCall culdoplasty suture was tied.   The suprapubic incisions were closed with subcuticular sutures of 3-0  Vicryl.  Dermabond was placed over these incisions.   A vaginal packing with Estrace cream was placed inside the vagina.   This concluded the patient's procedure.  There were no complications.  All needle, instrument, sponge counts were correct.  The patient is  escorted to the recovery room in stable and awake condition.      Randye Lobo, M.D.  Electronically Signed     BES/MEDQ  D:  01/09/2007  T:  01/09/2007  Job:  [id number]

## 2011-01-14 NOTE — Discharge Summary (Signed)
Cindy Patterson, Cindy Patterson NO.:  [id number]   MEDICAL RECORD NO.:  [id number]          PATIENT TYPE:  INP   LOCATION:  0467                         FACILITY:  [facility]   PHYSICIAN:  Ollen Gross, M.D.    DATE OF BIRTH:  06-30-1935   DATE OF ADMISSION:  08/25/2004  DATE OF DISCHARGE:  08/28/2004                                 DISCHARGE SUMMARY   ADMISSION DIAGNOSES:  1.  Osteoarthritis, right knee.  2.  Hypertension.  3.  Gastroesophageal reflux disease.  4.  Hyperlipidemia.   DISCHARGE DIAGNOSES:  1.  Osteoarthritis, right knee, status post right total knee arthroplasty.  2.  Hypertension.  3.  Gastroesophageal reflux disease.  4.  Hyperlipidemia.  5.  Mild postoperative blood-loss anemia.  Did not require transfusion.   PROCEDURE:  On August 17, 2004:  Right total knee arthroplasty.   SURGEON:  Ollen Gross, M.D.   ASSISTANT:  Georges Lynch. Darrelyn Hillock, M.D.   ANESTHESIA:  Spinal.   BLOOD LOSS:  Minimal.   DRAINS:  Hemovac x1.   TOURNIQUET TIME:  Sixty minutes at 300 mmHg.   CONSULTS:  Rehab services.   BRIEF HISTORY:  Cindy Patterson is a 76 year old female well known to Dr. Despina Hick.  She has known severe end-stage arthritis of the right knee.  The pain has  been refractory to nonoperative management.  Now presents for a total knee  arthroplasty.   LABORATORY DATA:  CBC preop:  Hemoglobin 13.2, hematocrit 38.7, white cell  count 5, differential within normal limits.  Postop hemoglobin 10.1.  Last  noted H&H 10.5 and 31.5.  PT/PTT preop:  12.7 and 24, respectively with an  INR of 0.9.  Serial pro times followed.  PT/INR 15.7 and 1.4.  Chem panel on  admission:  Elevated BUN of 32.  Remaining chem panel within normal limits.  Serial BMETs are followed.  Sodium did drop from 138 to 134.  Glucose went  up from 97 to 135, back down to 129.  BUN came down to within normal limits,  down to 9.  Last noted at 5.  Urinalysis:  Large leukocyte esterase, many  epithelial  cells, only 0-2 white and 0-2 red cells.  A few bacteria.  Blood  group type O+.   HOSPITAL COURSE:  Patient was admitted to [facility] , taken to  the OR, and underwent the above procedure without difficulty.  Tolerated the  procedure well.  Later was transferred to the floor for postop care.  She  actually did very good on the morning of day #1.  Had a little bit of pain  but was tolerating quite well.  Hemovac drain was pulled.  Blood pressure  was a little bit on the lower side; however, she was asymptomatic with this.  Started to get up with physical therapy.  Rehab consult was called.  There  was some question of whether she may or may not need inpatient rehab but  possibly would need [facility] rehab stay.  Therefore, they followed along.   By day #2, her pain was under excellent control.  She started  ambulating  more with physical therapy.  Dressing was changed.  IV PCA fluids were  discontinued.  She had a little bit of temp on day #2, and it went up on the  evening of day #2 to 101.6, but it was back down to afebrile by the morning  of day #3.  She was actually starting to get up and ambulate much better  with physical therapy.  She was ambulating 40 and 40 for a total of 80 feet  during the day.  Incision was healing well.  Was noted later that afternoon  that a bed had become available in the [facility] unit.  Patient was in agreement.  Patient was transferred over to [facility] for continued total knee protocol.   DISCHARGE PLAN:  1.  Patient was discharged to [facility] on August 28, 2004.  2.  Discharge diagnoses:  Please see above.  3.  Discharge meds:  Patient is to continue current medications as per the      [facility] that will be sent over with patient.  4.  Diet:  Continue previous home diet.  5.  Activity:  Total knee protocol.  Continue with gait-training ambulation      and ADLs, as per PT/OT.  May start showering.  6.  Follow up in two weeks from surgery following  discharge from the [facility]      unit.   DISPOSITION:  [facility].   CONDITION ON DISCHARGE:  Improved.      ALP/MEDQ  D:  10/20/2004  T:  10/21/2004  Job:  [id number]   cc:   Georgianne Fick, M.D.  [address]  [place]  [place] [address]  Fax: [phone]

## 2011-01-14 NOTE — H&P (Signed)
Cindy Patterson, ARTIST               ACCOUNT NO.:  [id number]   MEDICAL RECORD NO.:  [id number]          PATIENT TYPE:  INP   LOCATION:  NA                           FACILITY:  [facility]   PHYSICIAN:  Ollen Gross, M.D.    DATE OF BIRTH:  1935-02-14   DATE OF ADMISSION:  08/25/2004  DATE OF DISCHARGE:                                HISTORY & PHYSICAL   CHIEF COMPLAINT:  Right knee pain.   HISTORY OF PRESENT ILLNESS:  Patient is a 76 year old female seen by Dr.  Lequita Halt for ongoing right knee pain.  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  She has originally been followed by Dr. Worthy Rancher for ongoing  right knee arthritis.  She was referred over to Dr. Ollen Gross for  consideration of a rotating platform prosthesis.  She has been hurting quite  some time now.  She did have an arthroscopy over the summer because she had  several loose bodies.  He noted severe arthritis at that time.  She has  undergone a few cortisone injections that would only help temporarily.  X-  rays are reviewed when she came in the office and she is found to have end-  stage arthritis in the right knee, bone on bone medial, with a varus  deformity and large lateral marginal osteophyte as well as significant  patellofemoral disease bone on bone.  It is felt she would benefit from  undergoing a total knee replacement, risks and benefits discussed, patient  subsequently admitted to the hospital.   ALLERGIES:  No known drug allergies.   INTOLERANCES:  CODEINE causes nausea and vomiting.   CURRENT MEDICATIONS:  1.  Lisinopril 20 mg daily.  2.  Hydrochlorothiazide 25 mg daily.  3.  Lipitor 10 mg daily.  4.  Prilosec 20 mg daily.  5.  Darvocet one p.o. every 6 hours p.r.n.  6.  Multivitamin daily.  7.  Omega-3 fish oil daily.  8.  Ester-C 500 mg daily.  9.  Cosamin DS two capsules daily.  10. Aspirin 81 mg daily.  11. Folic acid one tablet daily.  12. Vitamin E one tablet daily.  13. Aleve two tabs  two to three times a day.  14. Calcium supplement.   PAST MEDICAL HISTORY:  1.  Hypertension.  2.  Gastroesophageal reflux disease.  3.  Osteoarthritis.  4.  Hyperlipidemia.   PAST SURGICAL HISTORY:  1.  Tonsillectomy/adenoidectomy in 1943.  2.  Appendectomy in 1954.  3.  Rhinoplasty in 1962.  4.  Abdominal lipectomy 1983.   SOCIAL HISTORY:  Widowed.  Nurse.  Nonsmoker.  Occasional intake of alcohol.  Has three children.  Mother and daughter will be assisting with care after  surgery.   FAMILY HISTORY:  Mother living age 93 with history of osteoporosis and  hypertension.  Father deceased age 23 with a history of melanoma.  Brother  with a history of diabetes.  She also has three other brothers with history  of hypertension.   REVIEW OF SYSTEMS:  GENERAL:  No fevers, chills, night sweats.  NEURO:  No  seizure, syncope, paralysis.  RESPIRATORY:  No shortness of breath,  productive cough, or hemoptysis.  CARDIOVASCULAR:  No chest pain, orthopnea.  GI:  No nausea, vomiting, diarrhea, constipation.  GU:  No dysuria,  hematuria, frequency.  MUSCULOSKELETAL:  Pertinent to the right knee found  in history of present illness.   PHYSICAL EXAMINATION:  VITAL SIGNS:  Pulse 68, respirations 12, blood  pressure 148/78.  GENERAL:  [age]-year-old white female well nourished, well developed,  no acute distress.  She is alert, oriented, cooperative, very pleasant at  time of exam.  She appears to be an excellent historian.  HEENT:  Normocephalic, atraumatic.  Pupils round and reactive.  EOMs are  intact.  NECK:  Supple.  No carotid bruits are appreciated.  CHEST:  Clear anterior-posterior chest walls.  No rhonchi, rales, or  wheezing.  HEART:  Regular rate and rhythm.  No murmurs.  S1, S2 noted.  ABDOMEN:  Soft, nontender, bowel sounds are present.  RECTAL/BREASTS/GENITALIA:  Not done not pertinent to present illness.  EXTREMITIES SIGNIFICANT TO THAT OF THE RIGHT KNEE:  Right lower  extremity  right knee does not show any effusion, range of motion of 5 to 105 degrees  with passive range of motion, she does have a varus malalignment deformity  noted, no instability.   IMPRESSION:  1.  Osteoarthritis right knee.  2.  Hypertension.  3.  Gastroesophageal reflux disease.  4.  Hyperlipidemia.   PLAN:  Patient admitted to [facility] to undergo a right total  knee arthroplasty.  Surgery will be performed by Dr. Ollen Gross.  Patient's medical doctor is Dr. Nicholos Johns.  Dr. Nicholos Johns will be  notified of the room number on admission and be consulted if needed for any  medical assistance with the patient throughout the hospital course.     Alex   ALP/MEDQ  D:  08/24/2004  T:  08/24/2004  Job:  [id number]   cc:   Georgianne Fick, M.D.  [address]  [place]  [place] [address]  Fax: [phone]

## 2011-01-14 NOTE — Op Note (Signed)
NAME:  Cindy Patterson, Cindy Patterson                         ACCOUNT NO.:  [id number]   MEDICAL RECORD NO.:  [id number]                   PATIENT TYPE:  AMB   LOCATION:  DAY                                  FACILITY:  [facility]   PHYSICIAN:  Georges Lynch. Gioffre, M.D.             DATE OF BIRTH:  25-Apr-1935   DATE OF PROCEDURE:  03/24/2004  DATE OF DISCHARGE:                                 OPERATIVE REPORT   PREOPERATIVE DIAGNOSES:  1. Severe degenerative arthritis, right knee.  2. Severe tear of the medial meniscus, right knee.   POSTOPERATIVE DIAGNOSES:  1. Severe degenerative arthritis, right knee.  2. Severe tear of the medial meniscus, right knee.   SURGEON:  Georges Lynch. Darrelyn Hillock, M.D.   ASSISTANT:  Nurse.   OPERATION:  1. Diagnostic arthroscopy,  right knee.  2. Abrasion chondroplasty of the medial femoral condyle, right knee.  3. Medial meniscectomy, right knee.  4. Synovectomy, right knee.   DESCRIPTION OF PROCEDURE:  Under general anesthesia, routine orthopedic prep  and draping of the right lower extremity was carried out.  The patient was  given 1 g of IV Ancef. A small punctate incision made in the suprapatellar  pouch, inflow cannula was inserted and her knee was distended with saline.  Following this, another small punctate incision was made in the lateral  joint space. The arthroscope was entered and complete diagnostic arthroscopy  was carried out. She had severe degenerative arthritis in her knee, moderate  on the lateral side with some mild fraying of the periphery of the lateral  meniscus but no meniscectomy necessary.  I went over to the medial joint,  the cruciate's were intact. She had a complete degenerated tear of the  medial meniscus. I introduced the shaver suction device, the medial approach  to the medial meniscectomy. She had severe degenerative arthritis, the  cartilage was absent from the distal femur and proximal tibial plateau. I  introduced the shaver suction device  and did abrasion chondroplasty of the  medial femoral condyle. She had large pieces just literally hanging off of  the condyle like orange peel. I thoroughly cleaned out all the loose  fragments, irrigated the knee out, closed all three punctate incisions with  3-0 nylon suture. I then injected with 20 mL of 0.5% Marcaine with  epinephrine into the knee joint. I dressed all the wounds with Neosporin  dressings and a sterile bundle dressing then was applied. The patient left  the operating room in satisfactory condition.                                               Ronald A. Darrelyn Hillock, M.D.    RAG/MEDQ  D:  03/24/2004  T:  03/24/2004  Job:  [id number]

## 2011-01-14 NOTE — Discharge Summary (Signed)
Cindy Patterson, Cindy Patterson               ACCOUNT NO.:  [id number]   MEDICAL RECORD NO.:  [id number]          PATIENT TYPE:  INP   LOCATION:  9308                          FACILITY:  [facility]   PHYSICIAN:  Randye Lobo, M.D.   DATE OF BIRTH:  09/23/1934   DATE OF ADMISSION:  01/09/2007  DATE OF DISCHARGE:  01/11/2007                               DISCHARGE SUMMARY   ADMISSION DIAGNOSES:  1. Incomplete uterovaginal prolapse.  2. Genuine stress incontinence.   DISCHARGE DIAGNOSES:  1. Incomplete uterovaginal prolapse.  2. Genuine stress incontinence.  3. Status post total vaginal hysterectomy with McCall culdoplasty,      anterior and posterior colporrhaphy, tension-free vaginal tape      sling and cystoscopy.   SIGNIFICANT OPERATIONS AND PROCEDURES:  The patient underwent a total  vaginal hysterectomy with Rogelio Seen culdoplasty, anterior and posterior  colporrhaphy, tension-free vaginal tape sling, and cystoscopy on Jan 09, 2007 at the [facility] under the direction of Dr. Conley Simmonds and  with the assistance of Dr. Lodema Hong.   ADMISSION HISTORY AND PHYSICAL EXAMINATION:  The patient is a 76-year-  old, gravida 64, para 4-0-2-3, Caucasian female who presented with  bladder and uterine prolapse and urinary incontinence.  The patient's  incontinence occurred with stressful maneuvers such as coughing and  standing.  Urodynamic testing did confirm the presence of genuine stress  incontinence.   The patient's past surgical history included a recent right knee  replacement in 2005.  The patient's medical history is significant for  gastroesophageal reflux disease, hyperlipidemia, and hypertension.   On physical examination, blood pressure 131/80.  The abdomen is soft and nontender.  There is evidence of a periumbilical  circumferential incision and a low Pfannenstiel type incision consistent  with a prior abdominoplasty procedure.  There was no evidence of any  hepatosplenomegaly or  organomegaly.  On pelvic examination there was first-degree uterine prolapse and a  second- to third-degree cystocele along with a first-degree rectocele.  The uterus was small and nontender, and there were no adnexal masses  appreciated.   The plan is made for the patient to undergo a total vaginal hysterectomy  with anterior and posterior colporrhaphy and tension-free vaginal tape  along with suburethral sling and cystoscopy.   The patient was admitted on Jan 09, 2007 at which time she underwent her  planned surgical procedure.  The surgery was uncomplicated and had an  estimated blood loss of 150 mL.  The patient was treated with ampicillin  and gentamicin IV for preoperative antibiotics due to her fairly recent  knee replacement procedure.   The patient's postoperative course was unremarkable.  She did have some  mild atelectasis on examination, which improved with incentive  spirometry and ambulation.  She did have good O2 saturation of 98% on  room air on postoperative day #1.   The patient had good control of her pain with morphine PCA and Toradol.  She was converted over to oral Percocet and ibuprofen which controlled  her pain well by postoperative day #1.  Her diet was slowly advanced to  normal,  and she was tolerating this at the time of her discharge.  The  patient was able to ambulate independently during the hospitalization,  and she did receive both PAS stockings and TED hose for DVT prophylaxis.   The patient's Foley catheter was removed on postoperative day #1, and  she was able to void well with residuals between 10 and 20 mL.   Her suprapubic incisions did demonstrate some ecchymotic areas without  evidence of induration or hematoma.   The patient's discharge hemoglobin was 10.4, and she was tolerating this  well.   The patient did have a sodium level of 130 on postoperative day #1.  This did resolve when the patient began taking a regular diet, and her   sodium level was noted to be 135 at the time of her discharge.  The  patient's final pathology report is pending at the time of her  discharge.   The patient was noted to be in good condition and ready for discharge on  postoperative day #2.   DISCHARGE INSTRUCTIONS:  1. Discharged to home.  2. The patient will take the following medications:  Percocet 5 mg/325      mg 1 to 2 p.o. q.4-6h. p.r.n. pain, ibuprofen 600 mg p.o. q.6h.      p.r.n. pain.  The patient will resume her usual medications and her      usual dosages with the exception of the Aleve and the Darvocet, and      she will not take these 2 medications while she is taking ibuprofen      and Percocet.  3. The patient will follow a regular diet.  4. The patient will have decreased activity for the next 6 weeks.  She      will not lift anything heavier than 10 pounds for the next 3      months.  5. The patient will follow up in the office for a recheck in 10 days.  6. The patient will call if she experiences problems with fever,      nausea and vomiting, pain uncontrolled by medication, active      vaginal bleeding, difficulty voiding, or any other concern.      Randye Lobo, M.D.  Electronically Signed     BES/MEDQ  D:  02/24/2007  T:  02/25/2007  Job:  [id number]

## 2011-09-05 DIAGNOSIS — M5380 Other specified dorsopathies, site unspecified: Secondary | ICD-10-CM | POA: Diagnosis not present

## 2011-09-05 DIAGNOSIS — IMO0002 Reserved for concepts with insufficient information to code with codable children: Secondary | ICD-10-CM | POA: Diagnosis not present

## 2011-09-06 DIAGNOSIS — I10 Essential (primary) hypertension: Secondary | ICD-10-CM | POA: Diagnosis not present

## 2011-09-08 DIAGNOSIS — E782 Mixed hyperlipidemia: Secondary | ICD-10-CM | POA: Diagnosis not present

## 2011-09-08 DIAGNOSIS — R5383 Other fatigue: Secondary | ICD-10-CM | POA: Diagnosis not present

## 2011-09-08 DIAGNOSIS — I10 Essential (primary) hypertension: Secondary | ICD-10-CM | POA: Diagnosis not present

## 2011-09-08 DIAGNOSIS — M159 Polyosteoarthritis, unspecified: Secondary | ICD-10-CM | POA: Diagnosis not present

## 2011-09-08 DIAGNOSIS — R5381 Other malaise: Secondary | ICD-10-CM | POA: Diagnosis not present

## 2011-10-06 DIAGNOSIS — M519 Unspecified thoracic, thoracolumbar and lumbosacral intervertebral disc disorder: Secondary | ICD-10-CM | POA: Diagnosis not present

## 2011-10-06 DIAGNOSIS — M4120 Other idiopathic scoliosis, site unspecified: Secondary | ICD-10-CM | POA: Diagnosis not present

## 2011-10-06 DIAGNOSIS — M4180 Other forms of scoliosis, site unspecified: Secondary | ICD-10-CM | POA: Diagnosis not present

## 2012-01-05 DIAGNOSIS — M47817 Spondylosis without myelopathy or radiculopathy, lumbosacral region: Secondary | ICD-10-CM | POA: Diagnosis not present

## 2012-01-05 DIAGNOSIS — M4120 Other idiopathic scoliosis, site unspecified: Secondary | ICD-10-CM | POA: Diagnosis not present

## 2012-01-05 DIAGNOSIS — IMO0002 Reserved for concepts with insufficient information to code with codable children: Secondary | ICD-10-CM | POA: Diagnosis not present

## 2012-01-05 DIAGNOSIS — Z8269 Family history of other diseases of the musculoskeletal system and connective tissue: Secondary | ICD-10-CM | POA: Diagnosis not present

## 2012-01-05 DIAGNOSIS — M19019 Primary osteoarthritis, unspecified shoulder: Secondary | ICD-10-CM | POA: Diagnosis not present

## 2012-01-05 DIAGNOSIS — M5137 Other intervertebral disc degeneration, lumbosacral region: Secondary | ICD-10-CM | POA: Diagnosis not present

## 2012-02-06 DIAGNOSIS — E782 Mixed hyperlipidemia: Secondary | ICD-10-CM | POA: Diagnosis not present

## 2012-02-06 DIAGNOSIS — I10 Essential (primary) hypertension: Secondary | ICD-10-CM | POA: Diagnosis not present

## 2012-02-06 DIAGNOSIS — B029 Zoster without complications: Secondary | ICD-10-CM | POA: Diagnosis not present

## 2012-02-13 DIAGNOSIS — B029 Zoster without complications: Secondary | ICD-10-CM | POA: Diagnosis not present

## 2012-03-15 DIAGNOSIS — I10 Essential (primary) hypertension: Secondary | ICD-10-CM | POA: Diagnosis not present

## 2012-03-15 DIAGNOSIS — R5381 Other malaise: Secondary | ICD-10-CM | POA: Diagnosis not present

## 2012-03-15 DIAGNOSIS — N390 Urinary tract infection, site not specified: Secondary | ICD-10-CM | POA: Diagnosis not present

## 2012-03-15 DIAGNOSIS — R5383 Other fatigue: Secondary | ICD-10-CM | POA: Diagnosis not present

## 2012-03-15 DIAGNOSIS — M159 Polyosteoarthritis, unspecified: Secondary | ICD-10-CM | POA: Diagnosis not present

## 2012-03-15 DIAGNOSIS — Z79899 Other long term (current) drug therapy: Secondary | ICD-10-CM | POA: Diagnosis not present

## 2012-03-15 DIAGNOSIS — E782 Mixed hyperlipidemia: Secondary | ICD-10-CM | POA: Diagnosis not present

## 2012-03-22 DIAGNOSIS — E2839 Other primary ovarian failure: Secondary | ICD-10-CM | POA: Diagnosis not present

## 2012-03-22 DIAGNOSIS — M159 Polyosteoarthritis, unspecified: Secondary | ICD-10-CM | POA: Diagnosis not present

## 2012-03-22 DIAGNOSIS — E782 Mixed hyperlipidemia: Secondary | ICD-10-CM | POA: Diagnosis not present

## 2012-03-22 DIAGNOSIS — K2100 Gastro-esophageal reflux disease with esophagitis, without bleeding: Secondary | ICD-10-CM | POA: Diagnosis not present

## 2012-03-22 DIAGNOSIS — I10 Essential (primary) hypertension: Secondary | ICD-10-CM | POA: Diagnosis not present

## 2012-03-30 DIAGNOSIS — G5600 Carpal tunnel syndrome, unspecified upper limb: Secondary | ICD-10-CM | POA: Diagnosis not present

## 2012-04-26 DIAGNOSIS — E7800 Pure hypercholesterolemia, unspecified: Secondary | ICD-10-CM | POA: Diagnosis not present

## 2012-04-26 DIAGNOSIS — M545 Low back pain, unspecified: Secondary | ICD-10-CM | POA: Diagnosis not present

## 2012-04-26 DIAGNOSIS — I10 Essential (primary) hypertension: Secondary | ICD-10-CM | POA: Diagnosis not present

## 2012-04-26 DIAGNOSIS — M47817 Spondylosis without myelopathy or radiculopathy, lumbosacral region: Secondary | ICD-10-CM | POA: Diagnosis not present

## 2012-04-26 DIAGNOSIS — M5137 Other intervertebral disc degeneration, lumbosacral region: Secondary | ICD-10-CM | POA: Diagnosis not present

## 2012-04-26 DIAGNOSIS — G894 Chronic pain syndrome: Secondary | ICD-10-CM | POA: Diagnosis not present

## 2012-04-26 DIAGNOSIS — IMO0002 Reserved for concepts with insufficient information to code with codable children: Secondary | ICD-10-CM | POA: Diagnosis not present

## 2012-05-01 DIAGNOSIS — M1710 Unilateral primary osteoarthritis, unspecified knee: Secondary | ICD-10-CM | POA: Diagnosis not present

## 2012-05-01 DIAGNOSIS — IMO0002 Reserved for concepts with insufficient information to code with codable children: Secondary | ICD-10-CM | POA: Diagnosis not present

## 2012-06-28 DIAGNOSIS — G5600 Carpal tunnel syndrome, unspecified upper limb: Secondary | ICD-10-CM | POA: Diagnosis not present

## 2012-06-28 DIAGNOSIS — R209 Unspecified disturbances of skin sensation: Secondary | ICD-10-CM | POA: Diagnosis not present

## 2012-07-05 DIAGNOSIS — Z23 Encounter for immunization: Secondary | ICD-10-CM | POA: Diagnosis not present

## 2012-07-12 DIAGNOSIS — M47812 Spondylosis without myelopathy or radiculopathy, cervical region: Secondary | ICD-10-CM | POA: Diagnosis not present

## 2012-07-12 DIAGNOSIS — M4310 Spondylolisthesis, site unspecified: Secondary | ICD-10-CM | POA: Diagnosis not present

## 2012-07-12 DIAGNOSIS — M4120 Other idiopathic scoliosis, site unspecified: Secondary | ICD-10-CM | POA: Diagnosis not present

## 2012-07-12 DIAGNOSIS — I708 Atherosclerosis of other arteries: Secondary | ICD-10-CM | POA: Diagnosis not present

## 2012-07-12 DIAGNOSIS — I700 Atherosclerosis of aorta: Secondary | ICD-10-CM | POA: Diagnosis not present

## 2012-07-24 DIAGNOSIS — G5600 Carpal tunnel syndrome, unspecified upper limb: Secondary | ICD-10-CM | POA: Diagnosis not present

## 2012-08-07 DIAGNOSIS — Z4789 Encounter for other orthopedic aftercare: Secondary | ICD-10-CM | POA: Diagnosis not present

## 2012-08-07 DIAGNOSIS — G5600 Carpal tunnel syndrome, unspecified upper limb: Secondary | ICD-10-CM | POA: Diagnosis not present

## 2012-08-23 DIAGNOSIS — Z4789 Encounter for other orthopedic aftercare: Secondary | ICD-10-CM | POA: Diagnosis not present

## 2012-08-23 DIAGNOSIS — G5600 Carpal tunnel syndrome, unspecified upper limb: Secondary | ICD-10-CM | POA: Diagnosis not present

## 2012-09-13 DIAGNOSIS — M159 Polyosteoarthritis, unspecified: Secondary | ICD-10-CM | POA: Diagnosis not present

## 2012-09-13 DIAGNOSIS — K2100 Gastro-esophageal reflux disease with esophagitis, without bleeding: Secondary | ICD-10-CM | POA: Diagnosis not present

## 2012-09-13 DIAGNOSIS — E782 Mixed hyperlipidemia: Secondary | ICD-10-CM | POA: Diagnosis not present

## 2012-09-13 DIAGNOSIS — I10 Essential (primary) hypertension: Secondary | ICD-10-CM | POA: Diagnosis not present

## 2012-09-18 DIAGNOSIS — N390 Urinary tract infection, site not specified: Secondary | ICD-10-CM | POA: Diagnosis not present

## 2012-09-18 DIAGNOSIS — R300 Dysuria: Secondary | ICD-10-CM | POA: Diagnosis not present

## 2012-09-20 DIAGNOSIS — E782 Mixed hyperlipidemia: Secondary | ICD-10-CM | POA: Diagnosis not present

## 2012-09-20 DIAGNOSIS — I10 Essential (primary) hypertension: Secondary | ICD-10-CM | POA: Diagnosis not present

## 2012-09-20 DIAGNOSIS — M159 Polyosteoarthritis, unspecified: Secondary | ICD-10-CM | POA: Diagnosis not present

## 2012-09-20 DIAGNOSIS — N390 Urinary tract infection, site not specified: Secondary | ICD-10-CM | POA: Diagnosis not present

## 2013-01-02 DIAGNOSIS — Z9889 Other specified postprocedural states: Secondary | ICD-10-CM | POA: Diagnosis not present

## 2013-01-02 DIAGNOSIS — M48061 Spinal stenosis, lumbar region without neurogenic claudication: Secondary | ICD-10-CM | POA: Diagnosis not present

## 2013-01-02 DIAGNOSIS — M5137 Other intervertebral disc degeneration, lumbosacral region: Secondary | ICD-10-CM | POA: Diagnosis not present

## 2013-01-02 DIAGNOSIS — M47817 Spondylosis without myelopathy or radiculopathy, lumbosacral region: Secondary | ICD-10-CM | POA: Diagnosis not present

## 2013-01-02 DIAGNOSIS — M4120 Other idiopathic scoliosis, site unspecified: Secondary | ICD-10-CM | POA: Diagnosis not present

## 2013-01-02 DIAGNOSIS — I700 Atherosclerosis of aorta: Secondary | ICD-10-CM | POA: Diagnosis not present

## 2013-02-22 DIAGNOSIS — G5600 Carpal tunnel syndrome, unspecified upper limb: Secondary | ICD-10-CM | POA: Diagnosis not present

## 2013-02-28 DIAGNOSIS — G5600 Carpal tunnel syndrome, unspecified upper limb: Secondary | ICD-10-CM | POA: Diagnosis not present

## 2013-03-07 DIAGNOSIS — G5600 Carpal tunnel syndrome, unspecified upper limb: Secondary | ICD-10-CM | POA: Diagnosis not present

## 2013-03-21 DIAGNOSIS — G5600 Carpal tunnel syndrome, unspecified upper limb: Secondary | ICD-10-CM | POA: Diagnosis not present

## 2013-03-27 DIAGNOSIS — H43819 Vitreous degeneration, unspecified eye: Secondary | ICD-10-CM | POA: Diagnosis not present

## 2013-03-27 DIAGNOSIS — H524 Presbyopia: Secondary | ICD-10-CM | POA: Diagnosis not present

## 2013-03-27 DIAGNOSIS — H35039 Hypertensive retinopathy, unspecified eye: Secondary | ICD-10-CM | POA: Diagnosis not present

## 2013-03-27 DIAGNOSIS — H353190 Nonexudative age-related macular degeneration, unspecified eye, stage unspecified: Secondary | ICD-10-CM | POA: Diagnosis not present

## 2013-03-27 DIAGNOSIS — H2510 Age-related nuclear cataract, unspecified eye: Secondary | ICD-10-CM | POA: Diagnosis not present

## 2013-03-27 DIAGNOSIS — H538 Other visual disturbances: Secondary | ICD-10-CM | POA: Diagnosis not present

## 2013-03-27 DIAGNOSIS — H35369 Drusen (degenerative) of macula, unspecified eye: Secondary | ICD-10-CM | POA: Diagnosis not present

## 2013-03-28 DIAGNOSIS — E782 Mixed hyperlipidemia: Secondary | ICD-10-CM | POA: Diagnosis not present

## 2013-03-28 DIAGNOSIS — I10 Essential (primary) hypertension: Secondary | ICD-10-CM | POA: Diagnosis not present

## 2013-03-28 DIAGNOSIS — Z0000 Encounter for general adult medical examination without abnormal findings: Secondary | ICD-10-CM | POA: Diagnosis not present

## 2013-03-28 DIAGNOSIS — Z1331 Encounter for screening for depression: Secondary | ICD-10-CM | POA: Diagnosis not present

## 2013-03-28 DIAGNOSIS — N390 Urinary tract infection, site not specified: Secondary | ICD-10-CM | POA: Diagnosis not present

## 2013-03-28 DIAGNOSIS — M159 Polyosteoarthritis, unspecified: Secondary | ICD-10-CM | POA: Diagnosis not present

## 2013-04-04 DIAGNOSIS — K2100 Gastro-esophageal reflux disease with esophagitis, without bleeding: Secondary | ICD-10-CM | POA: Diagnosis not present

## 2013-04-04 DIAGNOSIS — E782 Mixed hyperlipidemia: Secondary | ICD-10-CM | POA: Diagnosis not present

## 2013-04-04 DIAGNOSIS — M159 Polyosteoarthritis, unspecified: Secondary | ICD-10-CM | POA: Diagnosis not present

## 2013-04-04 DIAGNOSIS — I10 Essential (primary) hypertension: Secondary | ICD-10-CM | POA: Diagnosis not present

## 2013-04-04 DIAGNOSIS — Z23 Encounter for immunization: Secondary | ICD-10-CM | POA: Diagnosis not present

## 2013-05-09 DIAGNOSIS — Z23 Encounter for immunization: Secondary | ICD-10-CM | POA: Diagnosis not present

## 2013-07-11 DIAGNOSIS — M4120 Other idiopathic scoliosis, site unspecified: Secondary | ICD-10-CM | POA: Diagnosis not present

## 2013-10-17 DIAGNOSIS — I10 Essential (primary) hypertension: Secondary | ICD-10-CM | POA: Diagnosis not present

## 2013-10-29 DIAGNOSIS — M159 Polyosteoarthritis, unspecified: Secondary | ICD-10-CM | POA: Diagnosis not present

## 2013-10-29 DIAGNOSIS — I10 Essential (primary) hypertension: Secondary | ICD-10-CM | POA: Diagnosis not present

## 2013-10-29 DIAGNOSIS — H6120 Impacted cerumen, unspecified ear: Secondary | ICD-10-CM | POA: Diagnosis not present

## 2013-10-29 DIAGNOSIS — E782 Mixed hyperlipidemia: Secondary | ICD-10-CM | POA: Diagnosis not present

## 2013-11-05 DIAGNOSIS — K5289 Other specified noninfective gastroenteritis and colitis: Secondary | ICD-10-CM | POA: Diagnosis not present

## 2013-11-05 DIAGNOSIS — R112 Nausea with vomiting, unspecified: Secondary | ICD-10-CM | POA: Diagnosis not present

## 2013-11-05 DIAGNOSIS — R197 Diarrhea, unspecified: Secondary | ICD-10-CM | POA: Diagnosis not present

## 2013-11-05 DIAGNOSIS — H6120 Impacted cerumen, unspecified ear: Secondary | ICD-10-CM | POA: Diagnosis not present

## 2014-02-24 ENCOUNTER — Encounter (HOSPITAL_COMMUNITY): Payer: Self-pay | Admitting: Emergency Medicine

## 2014-02-24 ENCOUNTER — Ambulatory Visit
Admission: RE | Admit: 2014-02-24 | Discharge: 2014-02-24 | Disposition: A | Discharge status: Home / Self Care | Payer: Medicare Other | Source: Ambulatory Visit | Attending: Internal Medicine | Admitting: Internal Medicine

## 2014-02-24 ENCOUNTER — Other Ambulatory Visit: Payer: Self-pay | Admitting: Internal Medicine

## 2014-02-24 ENCOUNTER — Emergency Department (HOSPITAL_COMMUNITY)
Admission: EM | Admit: 2014-02-24 | Discharge: 2014-02-24 | Disposition: A | Discharge status: Home / Self Care | Payer: Medicare Other | Attending: Emergency Medicine | Admitting: Emergency Medicine

## 2014-02-24 DIAGNOSIS — I10 Essential (primary) hypertension: Secondary | ICD-10-CM | POA: Diagnosis not present

## 2014-02-24 DIAGNOSIS — K420 Umbilical hernia with obstruction, without gangrene: Secondary | ICD-10-CM | POA: Diagnosis not present

## 2014-02-24 DIAGNOSIS — R52 Pain, unspecified: Secondary | ICD-10-CM

## 2014-02-24 DIAGNOSIS — K429 Umbilical hernia without obstruction or gangrene: Secondary | ICD-10-CM | POA: Insufficient documentation

## 2014-02-24 DIAGNOSIS — I2510 Atherosclerotic heart disease of native coronary artery without angina pectoris: Secondary | ICD-10-CM | POA: Diagnosis not present

## 2014-02-24 DIAGNOSIS — R109 Unspecified abdominal pain: Secondary | ICD-10-CM | POA: Diagnosis not present

## 2014-02-24 DIAGNOSIS — R110 Nausea: Secondary | ICD-10-CM

## 2014-02-24 HISTORY — DX: Atherosclerotic heart disease of native coronary artery without angina pectoris: I25.10

## 2014-02-24 HISTORY — DX: Essential (primary) hypertension: I10

## 2014-02-24 HISTORY — DX: Dorsalgia, unspecified: M54.9

## 2014-02-24 HISTORY — DX: Umbilical hernia without obstruction or gangrene: K42.9

## 2014-02-24 LAB — CBC WITH DIFFERENTIAL/PLATELET
BASOS PCT: 0 % (ref 0–1)
Basophils Absolute: 0 10*3/uL (ref 0.0–0.1)
EOS ABS: 0.1 10*3/uL (ref 0.0–0.7)
EOS PCT: 1 % (ref 0–5)
HCT: 39.5 % (ref 36.0–46.0)
Hemoglobin: 13.5 g/dL (ref 12.0–15.0)
Lymphocytes Relative: 21 % (ref 12–46)
Lymphs Abs: 1.8 10*3/uL (ref 0.7–4.0)
MCH: 30.8 pg (ref 26.0–34.0)
MCHC: 34.2 g/dL (ref 30.0–36.0)
MCV: 90 fL (ref 78.0–100.0)
MONO ABS: 0.6 10*3/uL (ref 0.1–1.0)
MONOS PCT: 7 % (ref 3–12)
NEUTROS ABS: 6 10*3/uL (ref 1.7–7.7)
Neutrophils Relative %: 71 % (ref 43–77)
Platelets: 349 10*3/uL (ref 150–400)
RBC: 4.39 MIL/uL (ref 3.87–5.11)
RDW: 13.1 % (ref 11.5–15.5)
WBC: 8.4 10*3/uL (ref 4.0–10.5)

## 2014-02-24 LAB — COMPREHENSIVE METABOLIC PANEL
ALBUMIN: 4.4 g/dL (ref 3.5–5.2)
ALT: 12 U/L (ref 0–35)
AST: 20 U/L (ref 0–37)
Alkaline Phosphatase: 70 U/L (ref 39–117)
BILIRUBIN TOTAL: 0.6 mg/dL (ref 0.3–1.2)
BUN: 22 mg/dL (ref 6–23)
CALCIUM: 10.1 mg/dL (ref 8.4–10.5)
CHLORIDE: 91 meq/L — AB (ref 96–112)
CO2: 24 mEq/L (ref 19–32)
CREATININE: 0.87 mg/dL (ref 0.50–1.10)
GFR calc Af Amer: 73 mL/min — ABNORMAL LOW (ref >90)
GFR calc non Af Amer: 63 mL/min — ABNORMAL LOW (ref >90)
Glucose, Bld: 112 mg/dL — ABNORMAL HIGH (ref 70–99)
Potassium: 4.1 mEq/L (ref 3.7–5.3)
Sodium: 129 mEq/L — ABNORMAL LOW (ref 137–147)
Total Protein: 7.5 g/dL (ref 6.0–8.3)

## 2014-02-24 LAB — I-STAT CG4 LACTIC ACID, ED: LACTIC ACID, VENOUS: 0.92 mmol/L (ref 0.5–2.2)

## 2014-02-24 MED ORDER — IOHEXOL 300 MG/ML  SOLN
125.0000 mL | Freq: Once | INTRAMUSCULAR | Status: AC | PRN
  Administered 2014-02-24: 125 mL via INTRAVENOUS

## 2014-02-24 MED ORDER — SODIUM CHLORIDE 0.9 % IV BOLUS (SEPSIS)
1000.0000 mL | Freq: Once | INTRAVENOUS | Status: AC
  Administered 2014-02-24: 1000 mL via INTRAVENOUS

## 2014-02-24 MED ORDER — FENTANYL CITRATE 0.05 MG/ML IJ SOLN
50.0000 ug | Freq: Once | INTRAMUSCULAR | Status: AC
  Administered 2014-02-24: 50 ug via INTRAVENOUS
  Filled 2014-02-24: qty 2

## 2014-02-24 NOTE — Discharge Instructions (Signed)
Today your CT scan shows an incarcerated hernia. We were able to reduce the hernia successfully here.  Your blood work is reassuring. If your hernia comes out and cannot be easily pushed back in please return to the ED immediately.   Hernia A hernia occurs when an internal organ pushes out through a weak spot in the abdominal wall. Hernias most commonly occur in the groin and around the navel. Hernias often can be pushed back into place (reduced). Most hernias tend to get worse over time. Some abdominal hernias can get stuck in the opening (irreducible or incarcerated hernia) and cannot be reduced. An irreducible abdominal hernia which is tightly squeezed into the opening is at risk for impaired blood supply (strangulated hernia). A strangulated hernia is a medical emergency. Because of the risk for an irreducible or strangulated hernia, surgery may be recommended to repair a hernia. CAUSES   Heavy lifting.  Prolonged coughing.  Straining to have a bowel movement.  A cut (incision) made during an abdominal surgery. HOME CARE INSTRUCTIONS   Bed rest is not required. You may continue your normal activities.  Avoid lifting more than 10 pounds (4.5 kg) or straining.  Cough gently. If you are a smoker it is best to stop. Even the best hernia repair can break down with the continual strain of coughing. Even if you do not have your hernia repaired, a cough will continue to aggravate the problem.  Do not wear anything tight over your hernia. Do not try to keep it in with an outside bandage or truss. These can damage abdominal contents if they are trapped within the hernia sac.  Eat a normal diet.  Avoid constipation. Straining over long periods of time will increase hernia size and encourage breakdown of repairs. If you cannot do this with diet alone, stool softeners may be used. SEEK IMMEDIATE MEDICAL CARE IF:   You have a fever.  You develop increasing abdominal pain.  You feel nauseous or  vomit.  Your hernia is stuck outside the abdomen, looks discolored, feels hard, or is tender.  You have any changes in your bowel habits or in the hernia that are unusual for you.  You have increased pain or swelling around the hernia.  You cannot push the hernia back in place by applying gentle pressure while lying down. MAKE SURE YOU:   Understand these instructions.  Will watch your condition.  Will get help right away if you are not doing well or get worse. Document Released: 08/15/2005 Document Revised: 11/07/2011 Document Reviewed: 04/03/2008 [facility] Patient Information 2015 [place], [place]. This information is not intended to replace advice given to you by your health care provider. Make sure you discuss any questions you have with your health care provider.

## 2014-02-24 NOTE — ED Provider Notes (Signed)
CSN: [id number]     Arrival date & time 02/24/14  1544 History   First MD Initiated Contact with Patient 02/24/14 1700     Chief Complaint  Patient presents with  . Hernia     (Consider location/radiation/quality/duration/timing/severity/associated sxs/prior Treatment) HPI Comments: Patient is a 79 year old female with history of hypertension, coronary artery disease who presents today with incarcerated umbilical hernia as seen on CT scan. She has noticed the hernia for the past 5 months, but has always been able to reduce the area. She is having pain at the site of her hernia. She went to see her primary care physician who ordered a CT abdomen which shows incarcerated loop of small bowel at the level of an umbilical hernia causing a degree of obstruction. She has associated dry heaving. Last BM was today which she feels was softer than normal, but otherwise unremarkable. She had some apple juice and nabs after the CT scan, but nothing else to eat or drink today. No fevers, chills, shortness of breath, chest pain. She takes a daily aspirin.   The history is provided by the patient. No language interpreter was used.    Past Medical History  Diagnosis Date  . Umbilical hernia   . Hypertension   . Coronary artery disease   . Back pain    Past Surgical History  Procedure Laterality Date  . Joint replacement     History reviewed. No pertinent family history. History  Substance Use Topics  . Smoking status: Never Smoker   . Smokeless tobacco: Not on file  . Alcohol Use: Not on file   OB History   Grav Para Term Preterm Abortions TAB SAB Ect Mult Living                 Review of Systems  Constitutional: Negative for fever and chills.  Respiratory: Negative for shortness of breath.   Cardiovascular: Negative for chest pain.  Gastrointestinal: Positive for nausea, vomiting and abdominal pain.  All other systems reviewed and are negative.     Allergies  Codeine  Home  Medications   Prior to Admission medications   Not on File   BP 148/99  Pulse 78  Temp(Src) 98.3 F (36.8 C) (Oral)  Resp 15  SpO2 99% Physical Exam  Nursing note and vitals reviewed. Constitutional: She is oriented to person, place, and time. She appears well-developed and well-nourished. No distress.  HENT:  Head: Normocephalic and atraumatic.  Right Ear: External ear normal.  Left Ear: External ear normal.  Nose: Nose normal.  Mouth/Throat: Oropharynx is clear and moist.  Eyes: Conjunctivae are normal.  Neck: Normal range of motion.  Cardiovascular: Normal rate, regular rhythm and normal heart sounds.   Pulmonary/Chest: Effort normal and breath sounds normal. No stridor. No respiratory distress. She has no wheezes. She has no rales.  Abdominal: Soft. She exhibits no distension. There is tenderness in the periumbilical area. There is no rigidity and no guarding.    umbilical hernia which was reduced. No surrounding erythema.   Musculoskeletal: Normal range of motion.  Neurological: She is alert and oriented to person, place, and time. She has normal strength.  Skin: Skin is warm and dry. She is not diaphoretic. No erythema.  Psychiatric: She has a normal mood and affect. Her behavior is normal.    ED Course  Procedures (including critical care time) Labs Review Labs Reviewed  COMPREHENSIVE METABOLIC PANEL - Abnormal; Notable for the following:    Sodium 129 (*)  Chloride 91 (*)    Glucose, Bld 112 (*)    GFR calc non Af Amer 63 (*)    GFR calc Af Amer 73 (*)    All other components within normal limits  CBC WITH DIFFERENTIAL  I-STAT CG4 LACTIC ACID, ED    Imaging Review Ct Abdomen Pelvis W Contrast  02/24/2014   CLINICAL DATA:  Abdominal pain with nausea  EXAM: CT ABDOMEN AND PELVIS WITH CONTRAST  TECHNIQUE: Multidetector CT imaging of the abdomen and pelvis was performed using the standard protocol following bolus administration of intravenous contrast. Oral  contrast was also administered.  CONTRAST:  170mL OMNIPAQUE IOHEXOL 300 MG/ML  SOLN  COMPARISON:  None.  FINDINGS: There is mild atelectatic change in the left lung base.  No focal liver lesions are identified. There is no appreciable biliary duct dilatation. Gallbladder wall is not thickened.  Spleen, pancreas, and adrenals appear normal. Kidneys bilaterally show no mass or hydronephrosis on either side. There is no renal or ureteral calculus on either side.  In the pelvis, the urinary bladder is midline with normal wall thickness. There is fat in each inguinal ring. There are sigmoid diverticula without diverticulitis. There is no pelvic mass or fluid collection. Uterus is absent.  There is an umbilical hernia which contains a loop of small bowel which appears incarcerated. There is bowel obstruction at the level of this apparently incarcerated loop of small bowel. There is no free air or portal venous air. Periappendiceal region appears normal ; patient is status post appendectomy.  There is no ascites, adenopathy, or abscess in the abdomen or pelvis. There is atherosclerotic change in the aorta but no aneurysm. There is extensive arthropathy in the lumbar spine. There is mild spondylolisthesis at L4-5 due to spondylosis. There are no blastic or lytic bone lesions.  IMPRESSION: There is an incarcerated loop of small bowel at the level of an umbilical hernia causing a degree of bowel obstruction focally in this area. No other bowel obstruction seen. No abscess. No free air.  Critical Value/emergent results were called by telephone at the time of interpretation on 02/24/2014 at 3:33 PM to Dr. Merrilee Seashore , who verbally acknowledged these results.  There is fat in both inguinal rings, more pronounced on the right than on the left.   Electronically Signed   By: Lowella Grip M.D.   On: 02/24/2014 15:33     EKG Interpretation   Date/Time:  Monday February 24 2014 17:14:34 EDT Ventricular Rate:  86 PR  Interval:  166 QRS Duration: 82 QT Interval:  352 QTC Calculation: 421 R Axis:   -3 Text Interpretation:  Sinus rhythm with marked sinus arrhythmia Cannot  rule out Anterior infarct , age undetermined Abnormal ECG No old tracing  to compare Confirmed by WARD,  DO, KRISTEN ([id number]) on 02/24/2014 6:16:54 PM      MDM   Final diagnoses:  Umbilical hernia without obstruction and without gangrene    Patient presents to ED with CT finding of incarcerated loop of small bowel at the level of an umbilical hernia. Hernia was easily reduced in ED by Dr. Leonides Schanz. No surrounding erythema. Normal lactic acid. Patient without leukocytosis. Patient was hyponatremic and hypochloremic likely due to being unable to tolerate fluids today. She was given fluid in the ED and feels improved. Patient tolerated ginger ale in ED. Patient's abdomen is soft and non surgical prior to discharge. Patient was given strict return instructions and outpatient surgery follow up. Vital signs stable  for discharge. Dr. Leonides Schanz evaluated patient and agrees with plan. Patient / Family / Caregiver informed of clinical course, understand medical decision-making process, and agree with plan.   Elwyn Lade, PA-C 02/25/14 1144

## 2014-02-24 NOTE — ED Notes (Signed)
Pt in stating she was told to come to ED due to strangulated umbilical hernia, pt has already had a CT scan completed which confirmed this dx, pt c/o abd pain at this time, c/o nausea with this- states she has had the hernia for months but today was unable to reduce it at home.

## 2014-02-24 NOTE — ED Provider Notes (Signed)
Medical screening examination/treatment/procedure(s) were conducted as a shared visit with non-physician practitioner(s) and myself.  I personally evaluated the patient during the encounter.   EKG Interpretation   Date/Time:  Monday February 24 2014 17:14:34 EDT Ventricular Rate:  86 PR Interval:  166 QRS Duration: 82 QT Interval:  352 QTC Calculation: 421 R Axis:   -3 Text Interpretation:  Sinus rhythm with marked sinus arrhythmia Cannot  rule out Anterior infarct , age undetermined Abnormal ECG No old tracing  to compare Confirmed by WARD,  DO, KRISTEN ([id number]) on 02/24/2014 6:16:54 PM      Pt is a 79 y.o. F with history of hypertension, coronary artery disease an umbilical hernia who presents to the emergency department with concerns for a stranger at umbilical hernia. Patient states that she normally is able to reduce her hernia but could not today. She states it has been out since this morning. She had a CT scan ordered by her primary care physician today which showed stranding in hernia and small bowel instruction secondary to this hernia. She denies any fevers but has had dry heaving. She has had some soft stool. On exam, patient is hemodynamically stable, nontoxic appearing, no apparent distress. I am able to reduce her hernia when putting the patient in Trendelenburg. Her repeat exam is unremarkable. Labs including lactate are negative. She's been able to tolerate by mouth. We'll discharge her with surgery outpatient followup.  Tajique, DO 02/24/14 1846

## 2014-02-24 NOTE — ED Notes (Signed)
Ward, MD at bedside. 

## 2014-02-26 ENCOUNTER — Ambulatory Visit (INDEPENDENT_AMBULATORY_CARE_PROVIDER_SITE_OTHER): Payer: Medicare Other | Admitting: General Surgery

## 2014-02-26 ENCOUNTER — Encounter (INDEPENDENT_AMBULATORY_CARE_PROVIDER_SITE_OTHER): Payer: Self-pay | Admitting: General Surgery

## 2014-02-26 VITALS — BP 128/78 | HR 90 | Temp 97.5°F | Ht 62.0 in | Wt 210.0 lb

## 2014-02-26 DIAGNOSIS — K420 Umbilical hernia with obstruction, without gangrene: Secondary | ICD-10-CM | POA: Diagnosis not present

## 2014-02-26 NOTE — Progress Notes (Signed)
Patient ID: Cindy Patterson, female   DOB: 09/15/1934, 79 y.o.   MRN: [id number]  Chief Complaint  Patient presents with  . eval hernia    HPI Cindy Patterson is a 79 y.o. female.  The patient is a 79 year old female who is referred secondary to an incarcerated umbilical hernia. Patient was recently seen in the ER and was seen to have an incarcerated the hernial CT scan this was reduced manually and on physical exam. Patient was also referred to surgery for operative repair.  Discussed with the patient it sounds as she's had several episodes in the past with incarcerated umbilical hernia. She managed to reduce these in the past by herself.  HPI  Past Medical History  Diagnosis Date  . Umbilical hernia   . Hypertension   . Coronary artery disease   . Back pain   . GERD (gastroesophageal reflux disease)     Past Surgical History  Procedure Laterality Date  . Joint replacement      Family History  Problem Relation Age of Onset  . COPD Mother   . Heart disease Father     Social History History  Substance Use Topics  . Smoking status: Former Smoker    Quit date: 02/26/1989  . Smokeless tobacco: Not on file  . Alcohol Use: Yes    Allergies  Allergen Reactions  . Codeine     Nausea/vomiting     Current Outpatient Prescriptions  Medication Sig Dispense Refill  . aspirin 325 MG EC tablet Take 325 mg by mouth daily.      Marland Kitchen atorvastatin (LIPITOR) 10 MG tablet Take 10 mg by mouth daily.      . Cholecalciferol (VITAMIN D PO) Take 1 tablet by mouth daily.      . hydrochlorothiazide (HYDRODIURIL) 25 MG tablet Take 25 mg by mouth daily.      Marland Kitchen ibuprofen (ADVIL,MOTRIN) 200 MG tablet Take 200 mg by mouth every 6 (six) hours as needed for moderate pain.      Marland Kitchen lisinopril (PRINIVIL,ZESTRIL) 40 MG tablet Take 40 mg by mouth daily.      . methocarbamol (ROBAXIN) 500 MG tablet Take 500 mg by mouth daily.      . Multiple Vitamin (MULTIVITAMIN WITH MINERALS) TABS tablet Take 1 tablet by  mouth daily.      . naproxen sodium (ANAPROX) 220 MG tablet Take 220 mg by mouth 2 (two) times daily with a meal.      . omeprazole (PRILOSEC OTC) 20 MG tablet Take 20 mg by mouth daily.      . vitamin C (ASCORBIC ACID) 500 MG tablet Take 500 mg by mouth daily.       No current facility-administered medications for this visit.    Review of Systems Review of Systems  Constitutional: Negative.   HENT: Negative.   Respiratory: Negative.   Cardiovascular: Negative.   Gastrointestinal: Negative.   Neurological: Negative.   All other systems reviewed and are negative.   Blood pressure 128/78, pulse 90, temperature 97.5 F (36.4 C), height 5\' 2"  (1.575 m), weight 210 lb (95.255 kg).  Physical Exam Physical Exam  Constitutional: She is oriented to person, place, and time. She appears well-developed and well-nourished.  HENT:  Head: Normocephalic and atraumatic.  Eyes: Conjunctivae and EOM are normal. Pupils are equal, round, and reactive to light.  Neck: Normal range of motion. Neck supple.  Cardiovascular: Normal rate, regular rhythm and normal heart sounds.   Pulmonary/Chest: Effort normal and breath  sounds normal.  Abdominal: A hernia is present. Hernia confirmed positive in the ventral area.    Musculoskeletal: Normal range of motion.  Neurological: She is alert and oriented to person, place, and time.  Skin: Skin is warm and dry.  Psychiatric: She has a normal mood and affect.    Data Reviewed CT scan reveals an incarcerated umbilical hernia her last ED visit  Assessment    79 year old female with an umbilical hernia, and rectus diastases     Plan    1 we'll schedule the patient for urgent laparoscopic umbilical hernia repair with Mesh 2.All risks and benefits were discussed with the patient, to generally include infection, bleeding, damage to surrounding structures, acute and chronic nerve pain, and recurrence. Alternatives were offered and described.  All questions  were answered and the patient voiced understanding of the procedure and wishes to proceed at this point.         Rosario Jacks., Kaye Luoma 02/26/2014, 4:12 PM

## 2014-03-03 ENCOUNTER — Other Ambulatory Visit (INDEPENDENT_AMBULATORY_CARE_PROVIDER_SITE_OTHER): Payer: Self-pay

## 2014-03-03 DIAGNOSIS — K420 Umbilical hernia with obstruction, without gangrene: Secondary | ICD-10-CM | POA: Diagnosis not present

## 2014-03-03 MED ORDER — OXYCODONE-ACETAMINOPHEN 5-325 MG PO TABS: 1.0000 | ORAL_TABLET | ORAL | Status: DC | PRN

## 2014-03-14 ENCOUNTER — Ambulatory Visit (INDEPENDENT_AMBULATORY_CARE_PROVIDER_SITE_OTHER): Payer: Medicare Other | Admitting: General Surgery

## 2014-03-14 ENCOUNTER — Encounter (INDEPENDENT_AMBULATORY_CARE_PROVIDER_SITE_OTHER): Payer: Self-pay | Admitting: General Surgery

## 2014-03-14 VITALS — BP 180/100 | HR 72 | Temp 98.4°F | Resp 14 | Ht 61.5 in | Wt 209.0 lb

## 2014-03-14 DIAGNOSIS — Z9889 Other specified postprocedural states: Secondary | ICD-10-CM

## 2014-03-14 NOTE — Progress Notes (Signed)
Patient ID: YEE GANGI, female   DOB: 11-15-1934, 79 y.o.   MRN: [id number] Post op course The patient is a 79 year old female status post laparoscopic umbilical hernia repair with mesh. Patient has been doing well postoperatively. She had minimal pain.  On Exam: Wounds are clean dry and intact, and there is no hernia on palpation   Assessment and Plan 79 year old female status post laparoscopic umbilical hernia repair the mesh 1. We discussed no heavy lifting for 4 weeks 2. Patient follow up as needed   Ralene Ok, MD [facility] General & Minimally Invasive Surgery Trauma & Emergency Surgery

## 2014-05-13 DIAGNOSIS — Z0000 Encounter for general adult medical examination without abnormal findings: Secondary | ICD-10-CM | POA: Diagnosis not present

## 2014-05-13 DIAGNOSIS — N390 Urinary tract infection, site not specified: Secondary | ICD-10-CM | POA: Diagnosis not present

## 2014-05-13 DIAGNOSIS — Z1331 Encounter for screening for depression: Secondary | ICD-10-CM | POA: Diagnosis not present

## 2014-05-13 DIAGNOSIS — E782 Mixed hyperlipidemia: Secondary | ICD-10-CM | POA: Diagnosis not present

## 2014-05-13 DIAGNOSIS — I10 Essential (primary) hypertension: Secondary | ICD-10-CM | POA: Diagnosis not present

## 2014-05-13 DIAGNOSIS — R5381 Other malaise: Secondary | ICD-10-CM | POA: Diagnosis not present

## 2014-05-20 DIAGNOSIS — I10 Essential (primary) hypertension: Secondary | ICD-10-CM | POA: Diagnosis not present

## 2014-05-20 DIAGNOSIS — M19019 Primary osteoarthritis, unspecified shoulder: Secondary | ICD-10-CM | POA: Diagnosis not present

## 2014-05-20 DIAGNOSIS — E782 Mixed hyperlipidemia: Secondary | ICD-10-CM | POA: Diagnosis not present

## 2014-05-20 DIAGNOSIS — M159 Polyosteoarthritis, unspecified: Secondary | ICD-10-CM | POA: Diagnosis not present

## 2014-05-20 DIAGNOSIS — M25519 Pain in unspecified shoulder: Secondary | ICD-10-CM | POA: Diagnosis not present

## 2014-05-21 DIAGNOSIS — M810 Age-related osteoporosis without current pathological fracture: Secondary | ICD-10-CM | POA: Diagnosis not present

## 2014-05-27 DIAGNOSIS — I10 Essential (primary) hypertension: Secondary | ICD-10-CM | POA: Diagnosis not present

## 2014-05-27 DIAGNOSIS — H6120 Impacted cerumen, unspecified ear: Secondary | ICD-10-CM | POA: Diagnosis not present

## 2014-05-27 DIAGNOSIS — M25519 Pain in unspecified shoulder: Secondary | ICD-10-CM | POA: Diagnosis not present

## 2014-05-27 DIAGNOSIS — M159 Polyosteoarthritis, unspecified: Secondary | ICD-10-CM | POA: Diagnosis not present

## 2014-05-27 DIAGNOSIS — E782 Mixed hyperlipidemia: Secondary | ICD-10-CM | POA: Diagnosis not present

## 2014-05-30 DIAGNOSIS — M19012 Primary osteoarthritis, left shoulder: Secondary | ICD-10-CM | POA: Diagnosis not present

## 2014-05-30 DIAGNOSIS — M19011 Primary osteoarthritis, right shoulder: Secondary | ICD-10-CM | POA: Diagnosis not present

## 2014-06-03 DIAGNOSIS — M19012 Primary osteoarthritis, left shoulder: Secondary | ICD-10-CM | POA: Diagnosis not present

## 2014-06-03 DIAGNOSIS — M19011 Primary osteoarthritis, right shoulder: Secondary | ICD-10-CM | POA: Diagnosis not present

## 2014-06-04 DIAGNOSIS — M25511 Pain in right shoulder: Secondary | ICD-10-CM | POA: Diagnosis not present

## 2014-06-04 DIAGNOSIS — M25512 Pain in left shoulder: Secondary | ICD-10-CM | POA: Diagnosis not present

## 2014-06-04 DIAGNOSIS — M19012 Primary osteoarthritis, left shoulder: Secondary | ICD-10-CM | POA: Diagnosis not present

## 2014-06-04 DIAGNOSIS — M7531 Calcific tendinitis of right shoulder: Secondary | ICD-10-CM | POA: Diagnosis not present

## 2014-06-05 DIAGNOSIS — M19012 Primary osteoarthritis, left shoulder: Secondary | ICD-10-CM | POA: Diagnosis not present

## 2014-06-05 DIAGNOSIS — M19011 Primary osteoarthritis, right shoulder: Secondary | ICD-10-CM | POA: Diagnosis not present

## 2014-06-12 DIAGNOSIS — M19011 Primary osteoarthritis, right shoulder: Secondary | ICD-10-CM | POA: Diagnosis not present

## 2014-06-12 DIAGNOSIS — M19012 Primary osteoarthritis, left shoulder: Secondary | ICD-10-CM | POA: Diagnosis not present

## 2014-06-17 DIAGNOSIS — M19011 Primary osteoarthritis, right shoulder: Secondary | ICD-10-CM | POA: Diagnosis not present

## 2014-06-17 DIAGNOSIS — M19012 Primary osteoarthritis, left shoulder: Secondary | ICD-10-CM | POA: Diagnosis not present

## 2014-06-19 DIAGNOSIS — M19012 Primary osteoarthritis, left shoulder: Secondary | ICD-10-CM | POA: Diagnosis not present

## 2014-06-19 DIAGNOSIS — M19011 Primary osteoarthritis, right shoulder: Secondary | ICD-10-CM | POA: Diagnosis not present

## 2014-06-20 DIAGNOSIS — M25512 Pain in left shoulder: Secondary | ICD-10-CM | POA: Diagnosis not present

## 2014-06-20 DIAGNOSIS — M25511 Pain in right shoulder: Secondary | ICD-10-CM | POA: Diagnosis not present

## 2014-06-20 DIAGNOSIS — M7531 Calcific tendinitis of right shoulder: Secondary | ICD-10-CM | POA: Diagnosis not present

## 2014-06-20 DIAGNOSIS — M19012 Primary osteoarthritis, left shoulder: Secondary | ICD-10-CM | POA: Diagnosis not present

## 2014-09-12 IMAGING — CT CT ABD-PELV W/ CM
2 of 5 series · 16 of 46 positions shown, 18 images · IV contrast (READICAT/WATER & [ID] OMNI 300)
Comparison: None.

CLINICAL DATA: Abdominal pain with nausea

EXAM:
CT ABDOMEN AND PELVIS WITH CONTRAST
TECHNIQUE: Multidetector CT imaging of the abdomen and pelvis was performed
using the standard protocol following bolus administration of
intravenous contrast. Oral contrast was also administered.
CONTRAST:  125mL OMNIPAQUE IOHEXOL 300 MG/ML  SOLN

[Series 2: abd/pelvis with · axial · 0.86mm/px · z∈[-432,-22]mm · 13 of 94 slices shown, 15 images]
[im 6/94  soft-tissue]
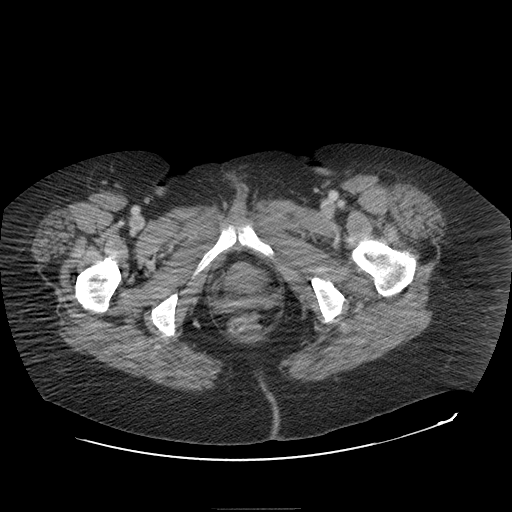
[im 6/94  bone]
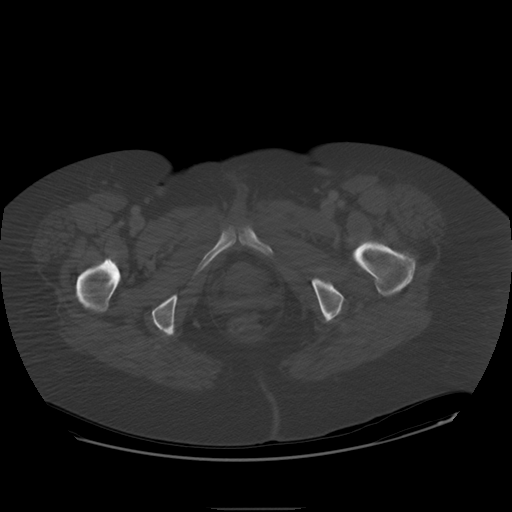
[im 11/94  soft-tissue]
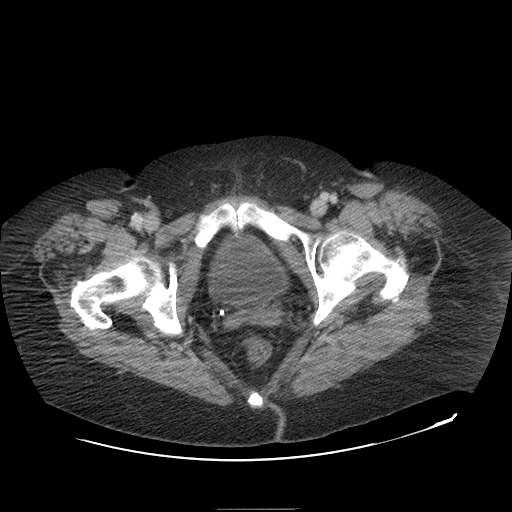
[im 22/94  soft-tissue]
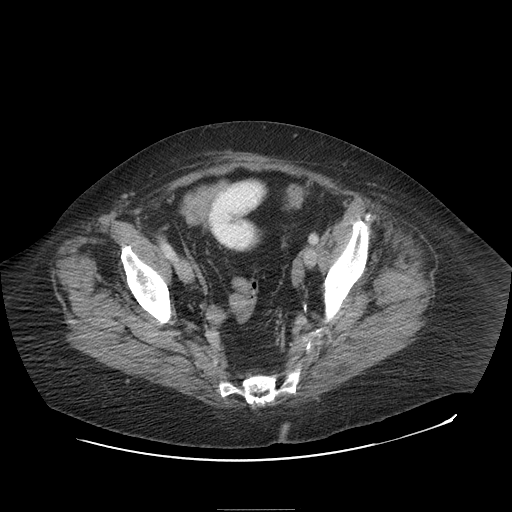
[im 28/94  soft-tissue]
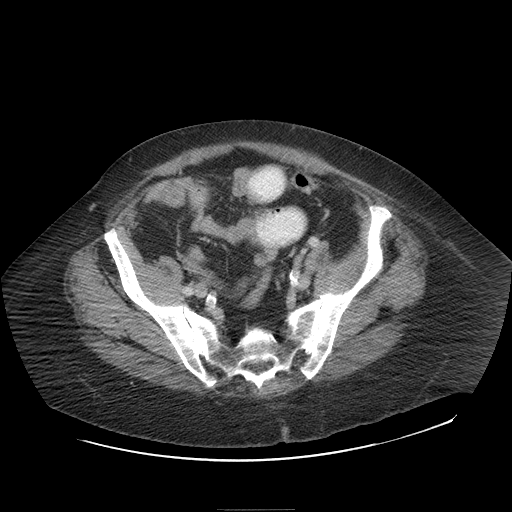
[im 33/94  soft-tissue]
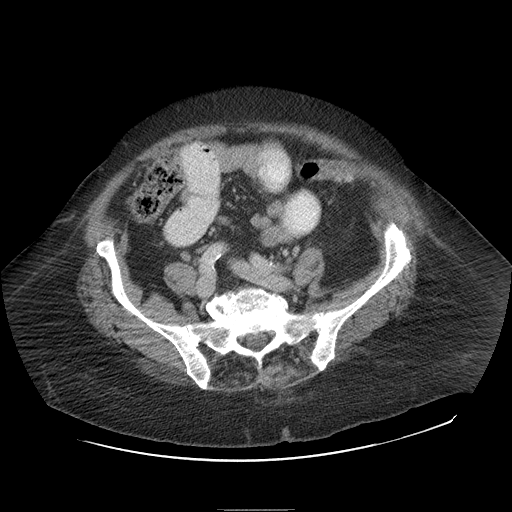
[im 39/94  soft-tissue]
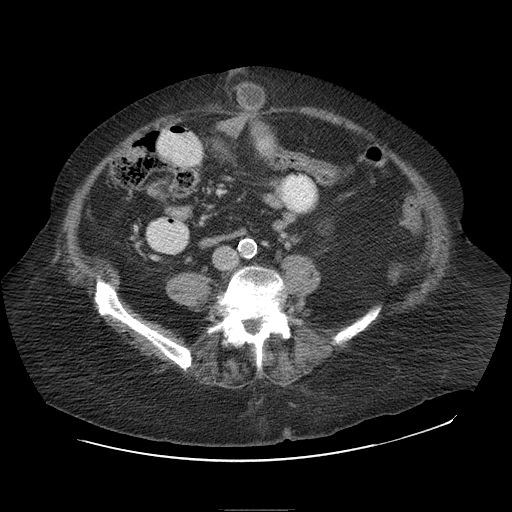
[im 50/94  soft-tissue]
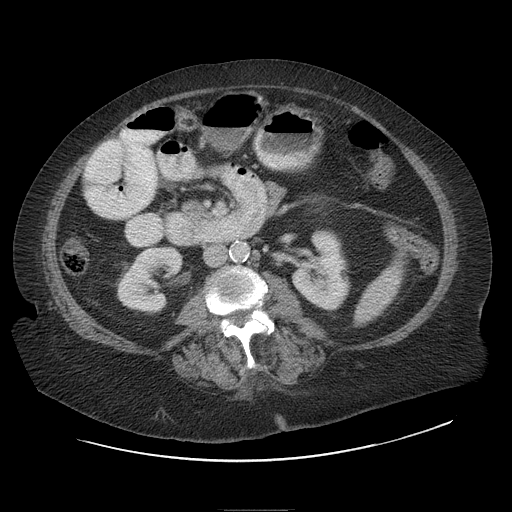
[im 55/94  soft-tissue]
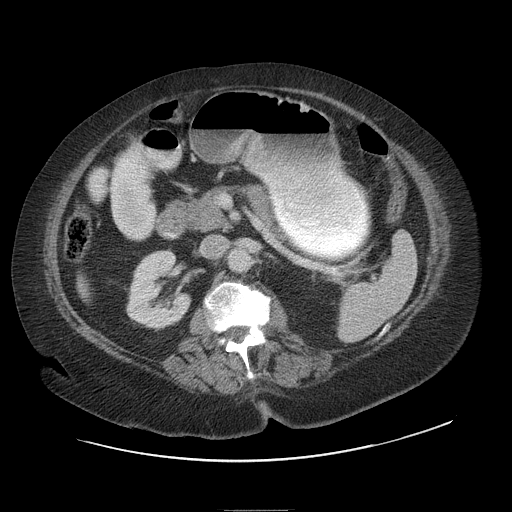
[im 61/94  soft-tissue]
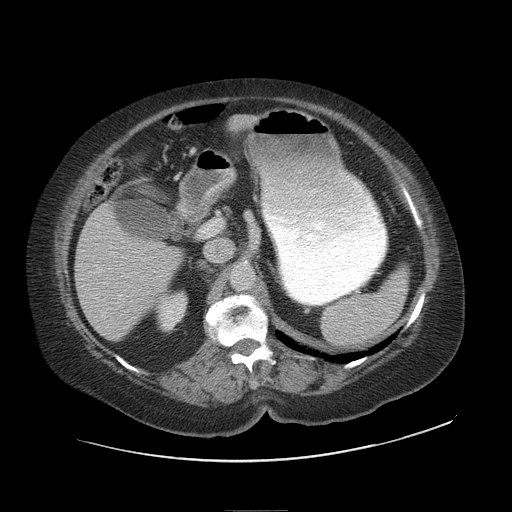
[im 61/94  bone]
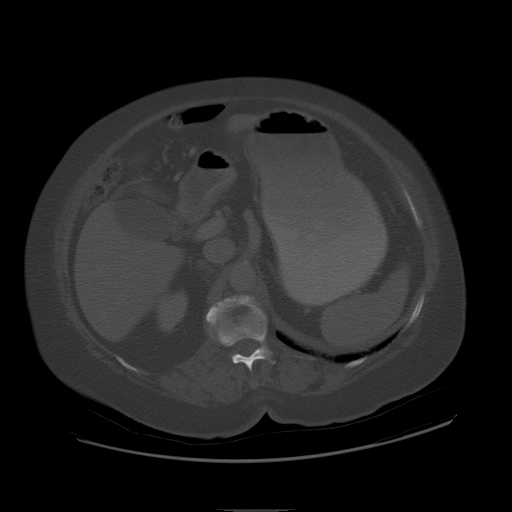
[im 66/94  soft-tissue]
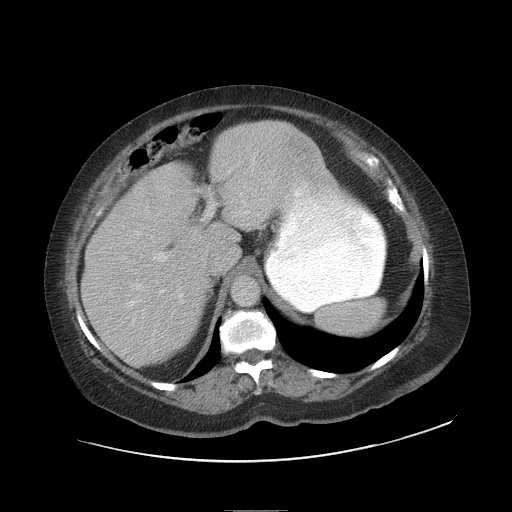
[im 72/94  soft-tissue]
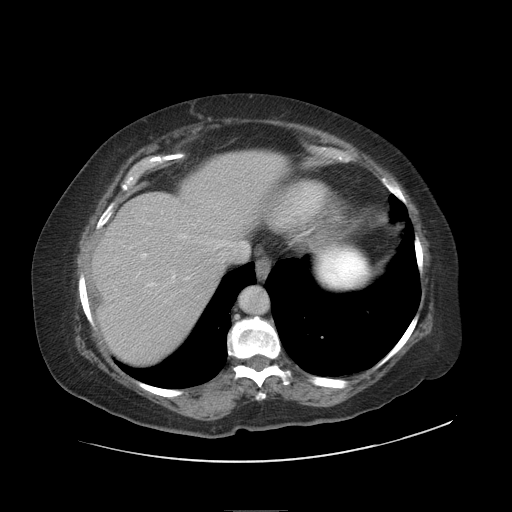
[im 83/94  soft-tissue]
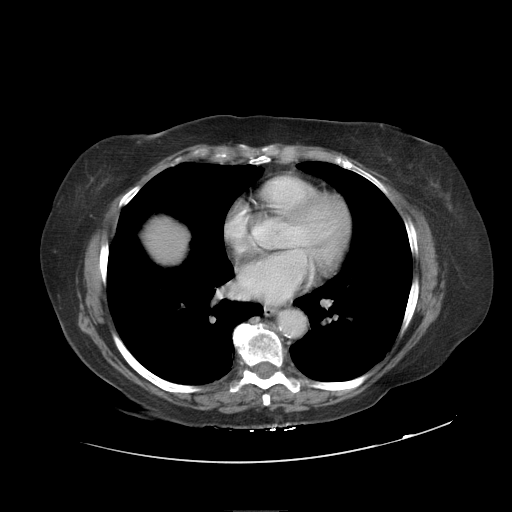
[im 88/94  soft-tissue]
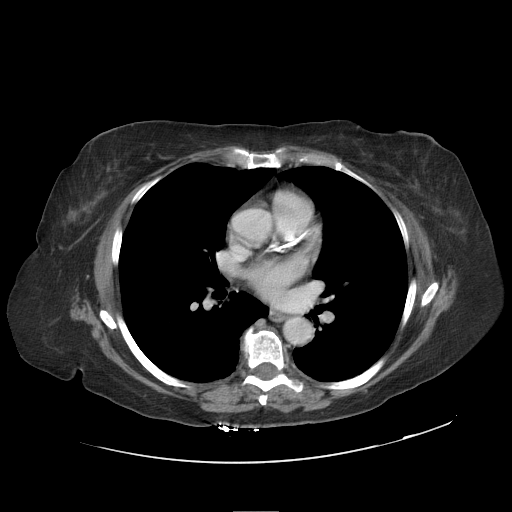

[Series 400: cor · coronal · 0.97mm/px · 3 of 157 slices shown]
[im 53/157  soft-tissue]
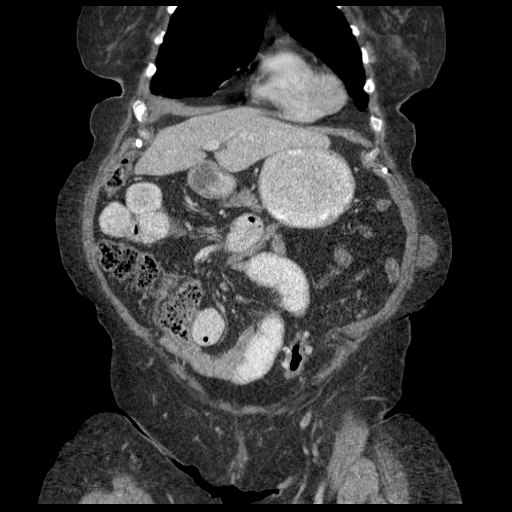
[im 70/157  soft-tissue]
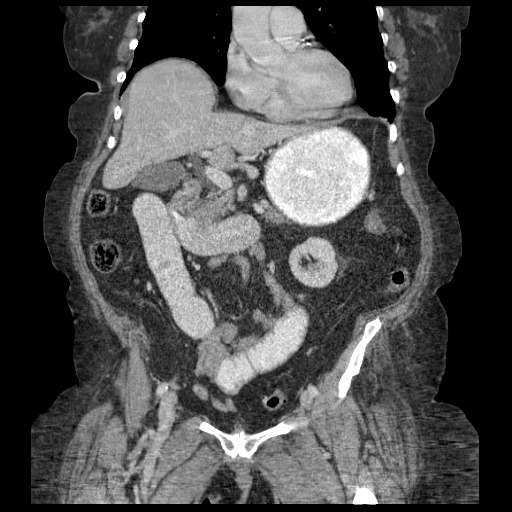
[im 87/157  soft-tissue]
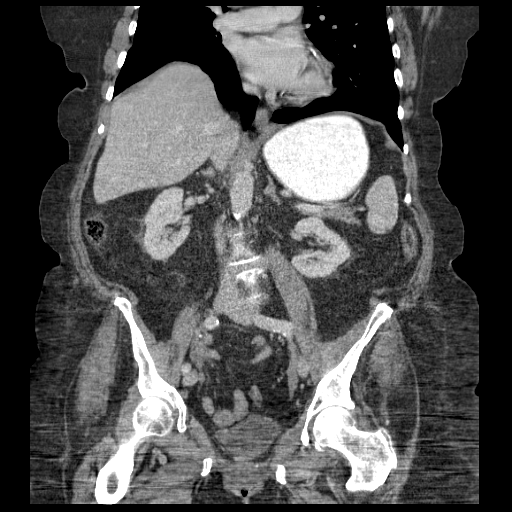

[16 of 46 positions shown; findings below may reference images not displayed]

FINDINGS: There is mild atelectatic change in the left lung base.

No focal liver lesions are identified. There is no appreciable
biliary duct dilatation. Gallbladder wall is not thickened.

Spleen, pancreas, and adrenals appear normal. Kidneys bilaterally
show no mass or hydronephrosis on either side. There is no renal or
ureteral calculus on either side.

In the pelvis, the urinary bladder is midline with normal wall
thickness. There is fat in each inguinal ring. There are sigmoid
diverticula without diverticulitis. There is no pelvic mass or fluid
collection. Uterus is absent.

There is an umbilical hernia which contains a loop of small bowel
which appears incarcerated. There is bowel obstruction at the level
of this apparently incarcerated loop of small bowel. There is no
free air or portal venous air. Periappendiceal region appears normal
; patient is status post appendectomy.

There is no ascites, adenopathy, or abscess in the abdomen or
pelvis. There is atherosclerotic change in the aorta but no
aneurysm. There is extensive arthropathy in the lumbar spine. There
is mild spondylolisthesis at L4-5 due to spondylosis. There are no
blastic or lytic bone lesions.
IMPRESSION: There is an incarcerated loop of small bowel at the level of an
umbilical hernia causing a degree of bowel obstruction focally in
this area. No other bowel obstruction seen. No abscess. No free air.

Critical Value/emergent results were called by telephone at the time
of interpretation on 02/24/2014 at [DATE] to Dr. HERMINIO HAYWARD
, who verbally acknowledged these results.

There is fat in both inguinal rings, more pronounced on the right
than on the left.

## 2014-10-07 DIAGNOSIS — E782 Mixed hyperlipidemia: Secondary | ICD-10-CM | POA: Diagnosis not present

## 2014-10-07 DIAGNOSIS — I10 Essential (primary) hypertension: Secondary | ICD-10-CM | POA: Diagnosis not present

## 2014-10-14 DIAGNOSIS — M19012 Primary osteoarthritis, left shoulder: Secondary | ICD-10-CM | POA: Diagnosis not present

## 2014-10-14 DIAGNOSIS — E782 Mixed hyperlipidemia: Secondary | ICD-10-CM | POA: Diagnosis not present

## 2014-10-14 DIAGNOSIS — I10 Essential (primary) hypertension: Secondary | ICD-10-CM | POA: Diagnosis not present

## 2014-10-14 DIAGNOSIS — Z23 Encounter for immunization: Secondary | ICD-10-CM | POA: Diagnosis not present

## 2015-03-19 DIAGNOSIS — M549 Dorsalgia, unspecified: Secondary | ICD-10-CM | POA: Diagnosis not present

## 2015-03-19 DIAGNOSIS — M419 Scoliosis, unspecified: Secondary | ICD-10-CM | POA: Diagnosis not present

## 2015-03-25 DIAGNOSIS — M419 Scoliosis, unspecified: Secondary | ICD-10-CM | POA: Diagnosis not present

## 2015-03-25 DIAGNOSIS — M545 Low back pain: Secondary | ICD-10-CM | POA: Diagnosis not present

## 2015-03-25 DIAGNOSIS — R29898 Other symptoms and signs involving the musculoskeletal system: Secondary | ICD-10-CM | POA: Diagnosis not present

## 2015-04-08 DIAGNOSIS — M5127 Other intervertebral disc displacement, lumbosacral region: Secondary | ICD-10-CM | POA: Diagnosis not present

## 2015-04-08 DIAGNOSIS — E049 Nontoxic goiter, unspecified: Secondary | ICD-10-CM | POA: Diagnosis not present

## 2015-04-08 DIAGNOSIS — M419 Scoliosis, unspecified: Secondary | ICD-10-CM | POA: Diagnosis not present

## 2015-04-08 DIAGNOSIS — M545 Low back pain: Secondary | ICD-10-CM | POA: Diagnosis not present

## 2015-04-08 DIAGNOSIS — M6281 Muscle weakness (generalized): Secondary | ICD-10-CM | POA: Diagnosis not present

## 2015-04-08 DIAGNOSIS — M2578 Osteophyte, vertebrae: Secondary | ICD-10-CM | POA: Diagnosis not present

## 2015-04-08 DIAGNOSIS — M5124 Other intervertebral disc displacement, thoracic region: Secondary | ICD-10-CM | POA: Diagnosis not present

## 2015-04-08 DIAGNOSIS — M4802 Spinal stenosis, cervical region: Secondary | ICD-10-CM | POA: Diagnosis not present

## 2015-04-08 DIAGNOSIS — M47816 Spondylosis without myelopathy or radiculopathy, lumbar region: Secondary | ICD-10-CM | POA: Diagnosis not present

## 2015-04-08 DIAGNOSIS — M4806 Spinal stenosis, lumbar region: Secondary | ICD-10-CM | POA: Diagnosis not present

## 2015-04-30 DIAGNOSIS — N390 Urinary tract infection, site not specified: Secondary | ICD-10-CM | POA: Diagnosis not present

## 2015-04-30 DIAGNOSIS — M25511 Pain in right shoulder: Secondary | ICD-10-CM | POA: Diagnosis not present

## 2015-05-05 DIAGNOSIS — H2511 Age-related nuclear cataract, right eye: Secondary | ICD-10-CM | POA: Diagnosis not present

## 2015-05-05 DIAGNOSIS — H2512 Age-related nuclear cataract, left eye: Secondary | ICD-10-CM | POA: Diagnosis not present

## 2015-05-05 DIAGNOSIS — H3531 Nonexudative age-related macular degeneration: Secondary | ICD-10-CM | POA: Diagnosis not present

## 2015-05-05 DIAGNOSIS — H25012 Cortical age-related cataract, left eye: Secondary | ICD-10-CM | POA: Diagnosis not present

## 2015-05-05 DIAGNOSIS — H25011 Cortical age-related cataract, right eye: Secondary | ICD-10-CM | POA: Diagnosis not present

## 2015-05-18 DIAGNOSIS — M19012 Primary osteoarthritis, left shoulder: Secondary | ICD-10-CM | POA: Diagnosis not present

## 2015-05-18 DIAGNOSIS — M19011 Primary osteoarthritis, right shoulder: Secondary | ICD-10-CM | POA: Diagnosis not present

## 2015-05-26 DIAGNOSIS — M4712 Other spondylosis with myelopathy, cervical region: Secondary | ICD-10-CM | POA: Diagnosis not present

## 2015-06-02 DIAGNOSIS — I10 Essential (primary) hypertension: Secondary | ICD-10-CM | POA: Diagnosis not present

## 2015-06-02 DIAGNOSIS — Z780 Asymptomatic menopausal state: Secondary | ICD-10-CM | POA: Diagnosis not present

## 2015-06-02 DIAGNOSIS — E782 Mixed hyperlipidemia: Secondary | ICD-10-CM | POA: Diagnosis not present

## 2015-06-02 DIAGNOSIS — Z1389 Encounter for screening for other disorder: Secondary | ICD-10-CM | POA: Diagnosis not present

## 2015-06-02 DIAGNOSIS — E559 Vitamin D deficiency, unspecified: Secondary | ICD-10-CM | POA: Diagnosis not present

## 2015-06-02 DIAGNOSIS — M19012 Primary osteoarthritis, left shoulder: Secondary | ICD-10-CM | POA: Diagnosis not present

## 2015-06-02 DIAGNOSIS — Z23 Encounter for immunization: Secondary | ICD-10-CM | POA: Diagnosis not present

## 2015-06-02 DIAGNOSIS — N390 Urinary tract infection, site not specified: Secondary | ICD-10-CM | POA: Diagnosis not present

## 2015-06-02 DIAGNOSIS — M5136 Other intervertebral disc degeneration, lumbar region: Secondary | ICD-10-CM | POA: Diagnosis not present

## 2015-06-02 DIAGNOSIS — M5030 Other cervical disc degeneration, unspecified cervical region: Secondary | ICD-10-CM | POA: Diagnosis not present

## 2015-06-04 ENCOUNTER — Other Ambulatory Visit: Payer: Self-pay | Admitting: Internal Medicine

## 2015-06-04 DIAGNOSIS — E041 Nontoxic single thyroid nodule: Secondary | ICD-10-CM

## 2015-06-09 DIAGNOSIS — I10 Essential (primary) hypertension: Secondary | ICD-10-CM | POA: Diagnosis not present

## 2015-06-09 DIAGNOSIS — H6123 Impacted cerumen, bilateral: Secondary | ICD-10-CM | POA: Diagnosis not present

## 2015-06-09 DIAGNOSIS — M5136 Other intervertebral disc degeneration, lumbar region: Secondary | ICD-10-CM | POA: Diagnosis not present

## 2015-06-09 DIAGNOSIS — E782 Mixed hyperlipidemia: Secondary | ICD-10-CM | POA: Diagnosis not present

## 2015-06-09 DIAGNOSIS — M5030 Other cervical disc degeneration, unspecified cervical region: Secondary | ICD-10-CM | POA: Diagnosis not present

## 2015-06-11 ENCOUNTER — Ambulatory Visit
Admission: RE | Admit: 2015-06-11 | Discharge: 2015-06-11 | Disposition: A | Discharge status: Home / Self Care | Payer: Medicare Other | Source: Ambulatory Visit | Attending: Internal Medicine | Admitting: Internal Medicine

## 2015-06-11 DIAGNOSIS — E041 Nontoxic single thyroid nodule: Secondary | ICD-10-CM

## 2015-06-11 DIAGNOSIS — E042 Nontoxic multinodular goiter: Secondary | ICD-10-CM | POA: Diagnosis not present

## 2015-06-15 DIAGNOSIS — E041 Nontoxic single thyroid nodule: Secondary | ICD-10-CM | POA: Diagnosis not present

## 2015-06-18 ENCOUNTER — Other Ambulatory Visit: Payer: Self-pay | Admitting: Internal Medicine

## 2015-06-18 DIAGNOSIS — E041 Nontoxic single thyroid nodule: Secondary | ICD-10-CM

## 2015-06-23 ENCOUNTER — Other Ambulatory Visit (HOSPITAL_COMMUNITY)
Admission: RE | Admit: 2015-06-23 | Discharge: 2015-06-23 | Disposition: A | Discharge status: Home / Self Care | Payer: Medicare Other | Source: Ambulatory Visit | Attending: Physician Assistant | Admitting: Physician Assistant

## 2015-06-23 ENCOUNTER — Ambulatory Visit
Admission: RE | Admit: 2015-06-23 | Discharge: 2015-06-23 | Disposition: A | Discharge status: Home / Self Care | Payer: Medicare Other | Source: Ambulatory Visit | Attending: Internal Medicine | Admitting: Internal Medicine

## 2015-06-23 DIAGNOSIS — E041 Nontoxic single thyroid nodule: Secondary | ICD-10-CM | POA: Diagnosis not present

## 2015-06-23 NOTE — Procedures (Signed)
Using direct ultrasound guidance, 4 passes were made using needles into the nodule within the left lobe of the thyroid.   Ultrasound was used to confirm needle placements on all occasions.   Specimens were sent to Pathology for analysis.   Smita Lesh S Santiana Glidden PA-C 06/23/2015 1:43 PM   

## 2015-08-04 DIAGNOSIS — E049 Nontoxic goiter, unspecified: Secondary | ICD-10-CM | POA: Diagnosis not present

## 2015-08-07 ENCOUNTER — Other Ambulatory Visit (HOSPITAL_COMMUNITY): Payer: Self-pay | Admitting: Endocrinology

## 2015-08-07 DIAGNOSIS — E041 Nontoxic single thyroid nodule: Secondary | ICD-10-CM

## 2015-08-14 ENCOUNTER — Encounter (HOSPITAL_COMMUNITY)
Admission: RE | Admit: 2015-08-14 | Discharge: 2015-08-14 | Disposition: A | Discharge status: Home / Self Care | Payer: Medicare Other | Source: Ambulatory Visit | Attending: Endocrinology | Admitting: Endocrinology

## 2015-08-14 DIAGNOSIS — R946 Abnormal results of thyroid function studies: Secondary | ICD-10-CM | POA: Diagnosis not present

## 2015-08-14 DIAGNOSIS — E041 Nontoxic single thyroid nodule: Secondary | ICD-10-CM | POA: Diagnosis not present

## 2015-08-14 MED ORDER — SODIUM PERTECHNETATE TC 99M INJECTION
10.0000 | Freq: Once | INTRAVENOUS | Status: AC | PRN
  Administered 2015-08-14: 10 via INTRAVENOUS

## 2015-08-17 ENCOUNTER — Other Ambulatory Visit: Payer: Self-pay | Admitting: Internal Medicine

## 2015-08-17 DIAGNOSIS — E049 Nontoxic goiter, unspecified: Secondary | ICD-10-CM

## 2015-12-28 IMAGING — US US SOFT TISSUE HEAD/NECK
1 series · 14 of 25 positions shown · non-contrast
Comparison: None.

CLINICAL DATA: Thyroid nodule

EXAM:
THYROID ULTRASOUND
TECHNIQUE: Ultrasound examination of the thyroid gland and adjacent soft
tissues was performed.

[Series 1: us soft tissue head/neck · 0.08mm/px · 14 of 47 slices shown]
[im 1/47]
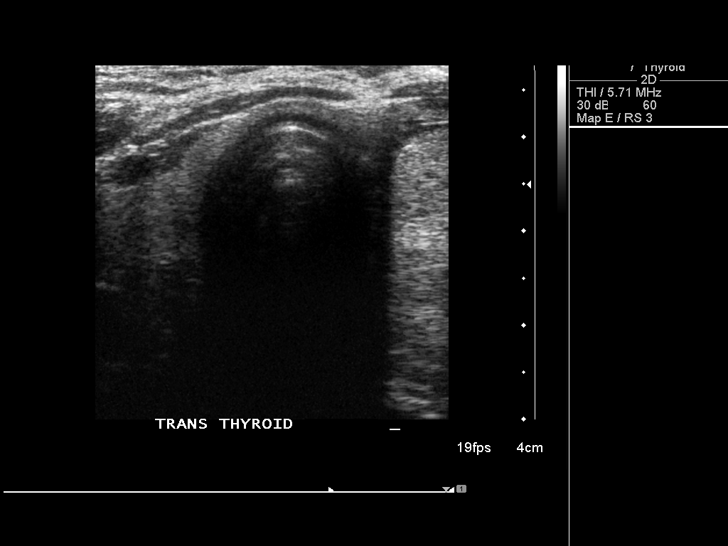
[im 4/47]
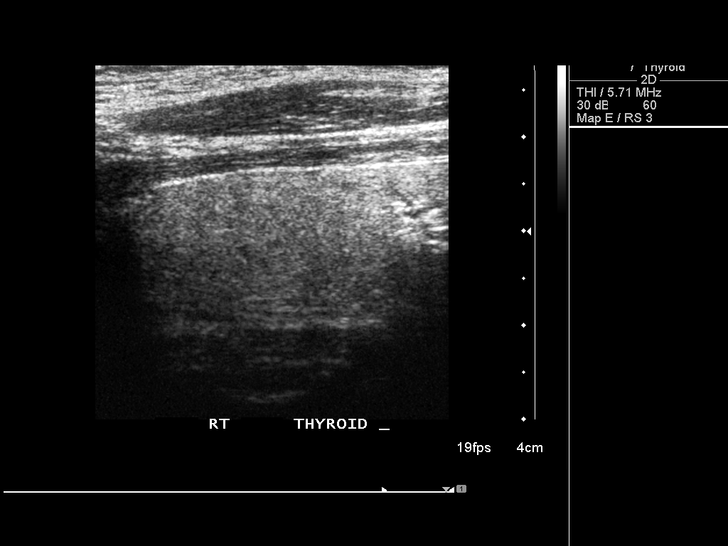
[im 8/47]
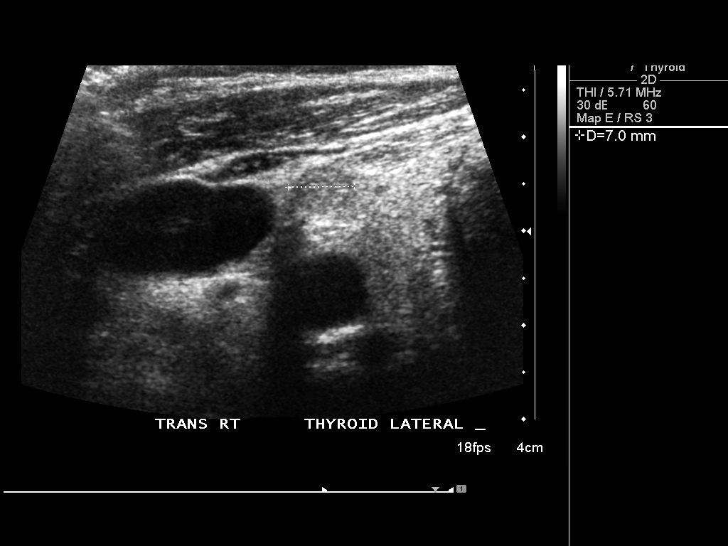
[im 12/47]
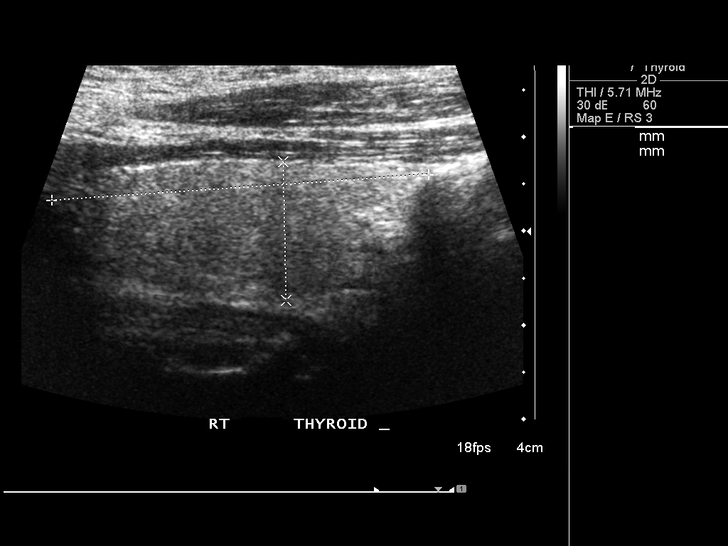
[im 16/47]
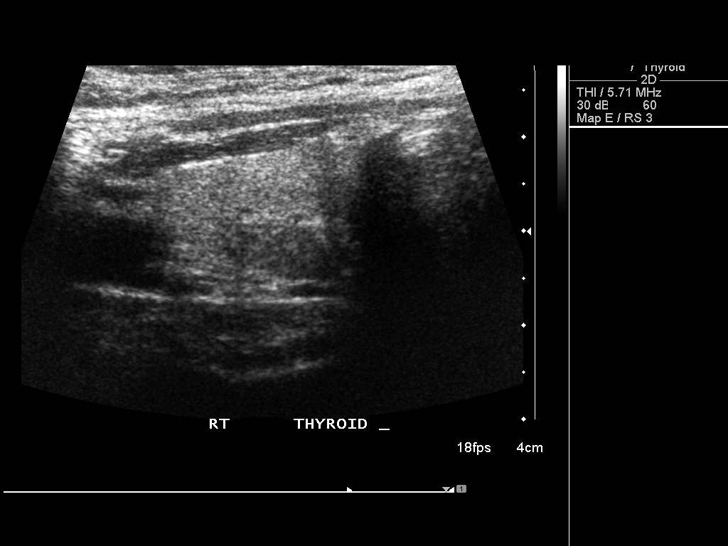
[im 18/47]
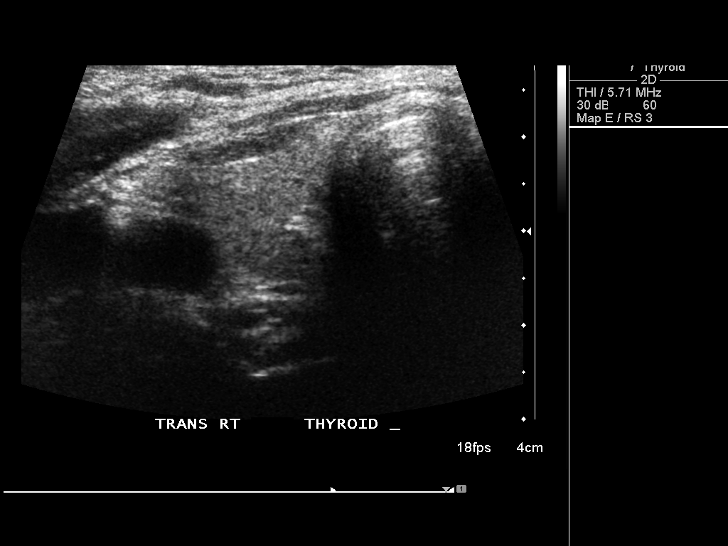
[im 22/47]
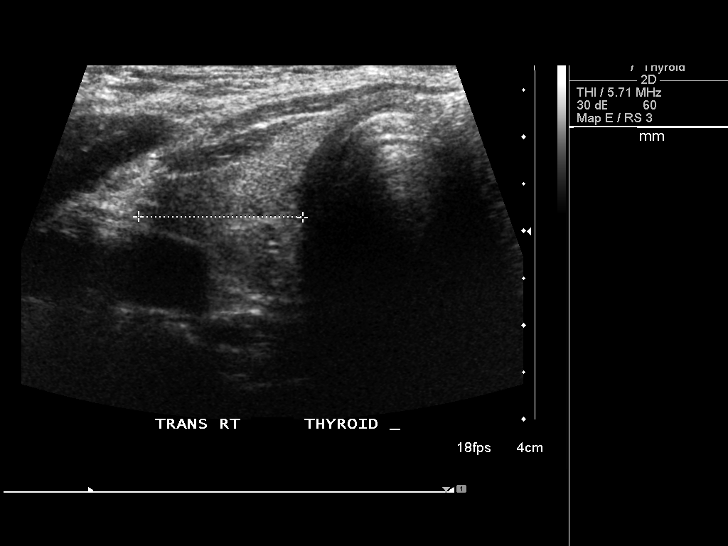
[im 25/47]
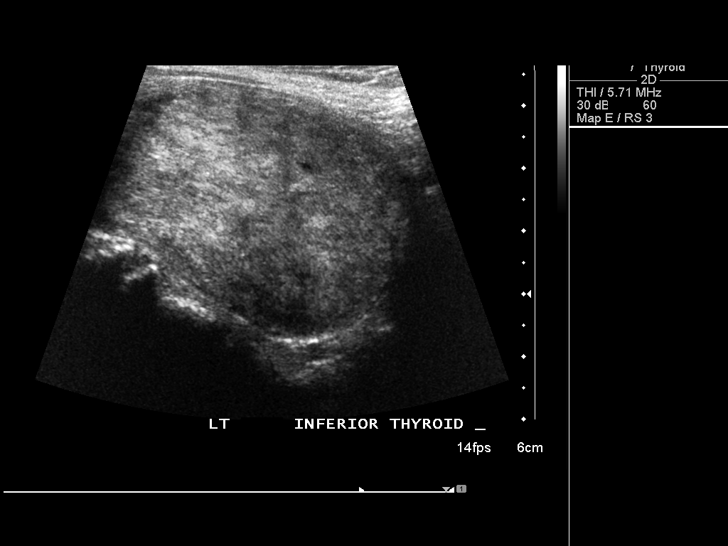
[im 29/47]
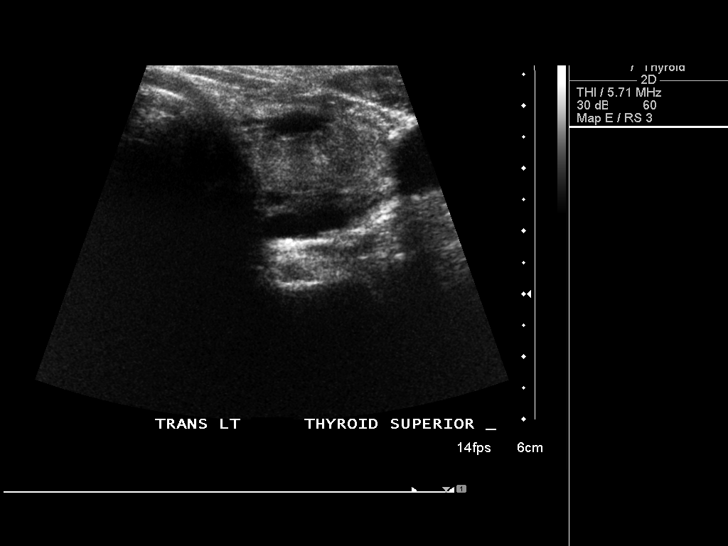
[im 31/47]
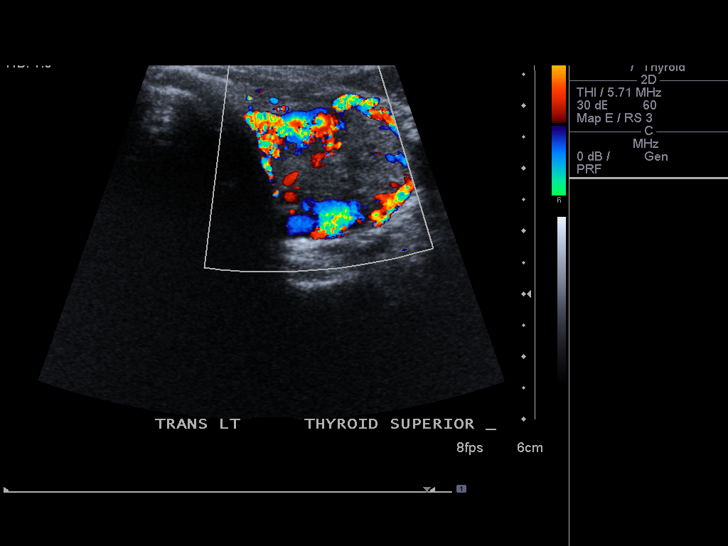
[im 35/47]
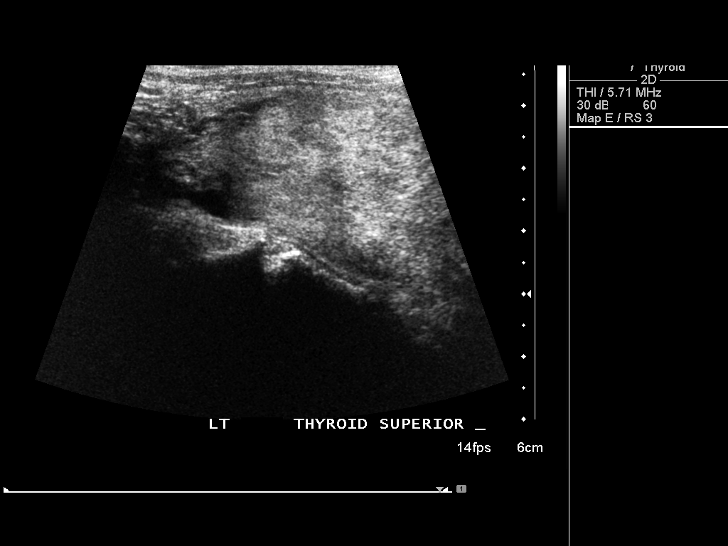
[im 39/47]
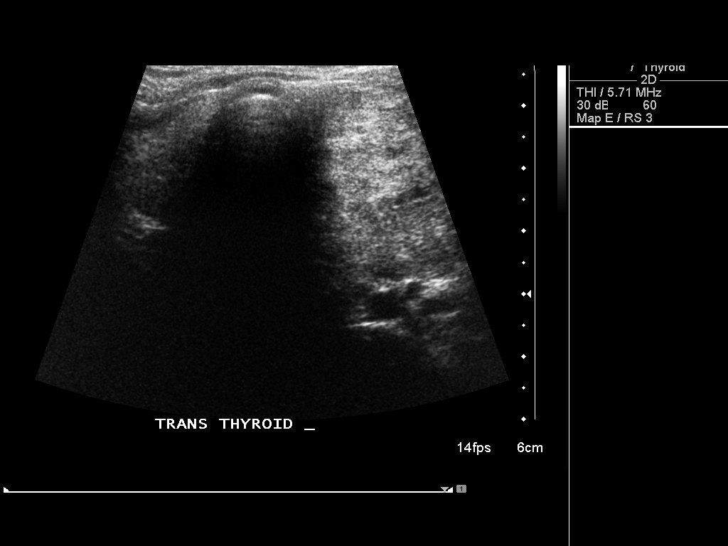
[im 43/47]
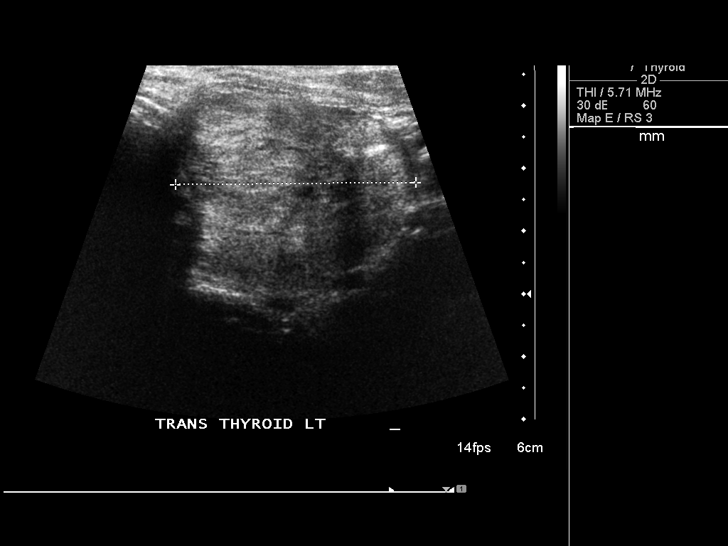
[im 47/47]
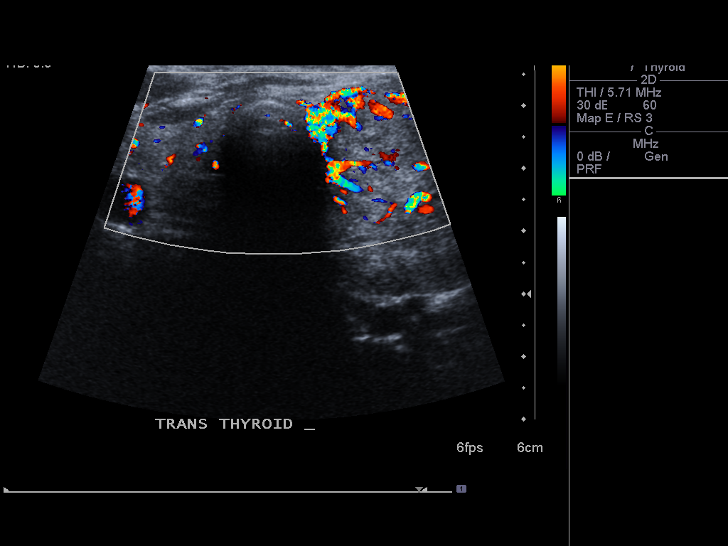

[14 of 25 positions shown; findings below may reference images not displayed]

FINDINGS: Right thyroid lobe

Measurements: 4.0 x 1.5 x 1.7 cm. 11 x 8 x 7 mm upper pole nodule
containing calcifications.

Left thyroid lobe

Measurements: 6.8 x 4.2 x 3.8 cm. Large nodule occupies most of the
left lobe and measures 6.1 x 3.7 x 3.8 cm. There are central
calcifications. There is increased vascularity on color flow
Doppler.

Isthmus

Thickness: 3 mm.  No nodules visualized.

Lymphadenopathy

None visualized.
IMPRESSION: Bilateral nodules. Dominant left lobe nodule measures 6.1 cm.
Findings meet consensus criteria for biopsy. Ultrasound-guided fine
needle aspiration should be considered, as per the consensus
statement: Management of Thyroid Nodules Detected at US: Society of
Radiologists in Ultrasound Consensus Conference Statement. Radiology

## 2016-01-09 IMAGING — US US THYROID BIOPSY
1 series · 13 of 17 positions shown · non-contrast
Comparison: Ultrasound done 06/11/2015.

CLINICAL DATA: Large thyroid nodule which occupies most of the left
lobe and measures 6.1 x 3.7 x 3.8 cm containing central
calcifications. Also there is increased vascularity on color flow
Doppler. Request for ultrasound guided fine needle aspirate biopsy

EXAM:
ULTRASOUND GUIDED NEEDLE ASPIRATE BIOPSY OF THE THYROID GLAND

[Series 1: us thyroid biopsy · 0.09mm/px · 17 acquisitions, 13 frames shown]
[im 1/17]
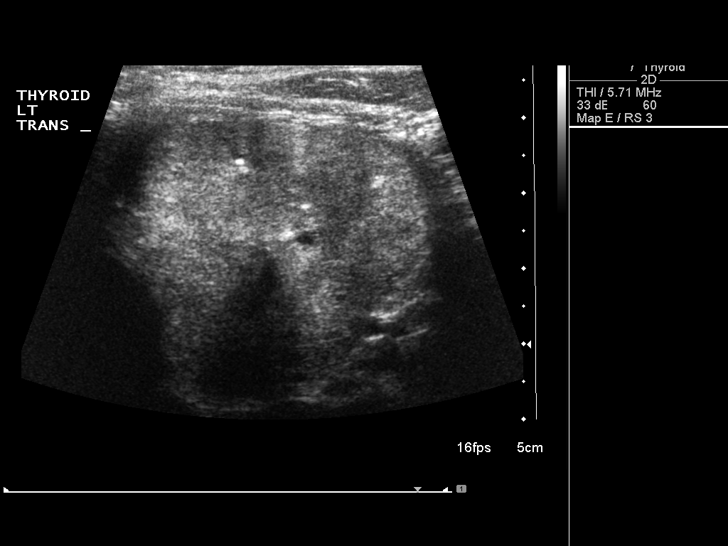
[im 2/17]
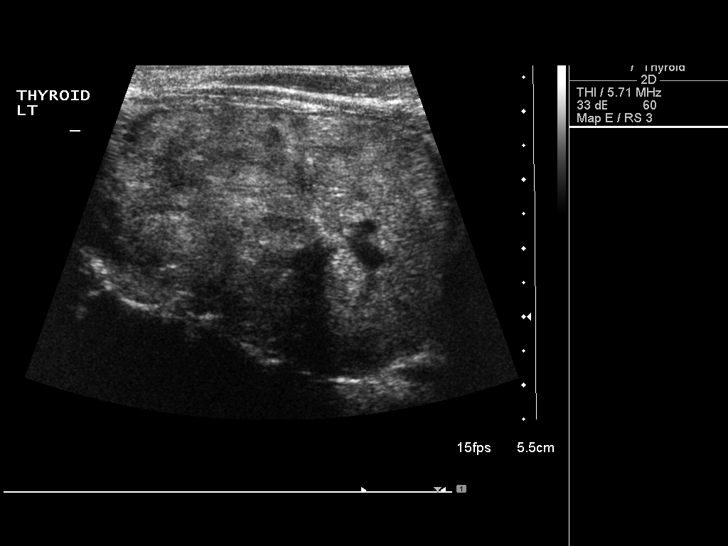
[im 4/17]
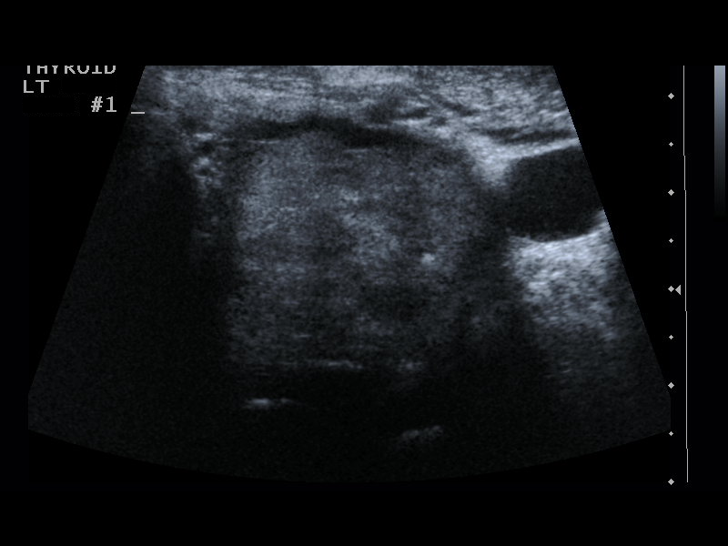
[im 5/17]
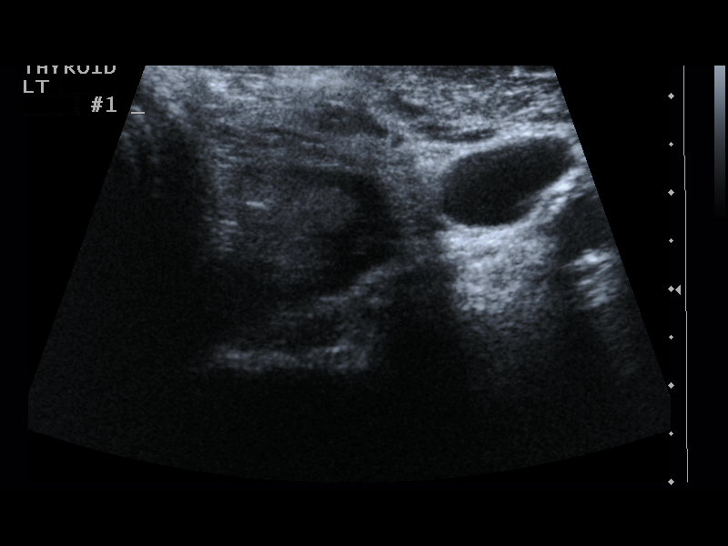
[im 6/17]
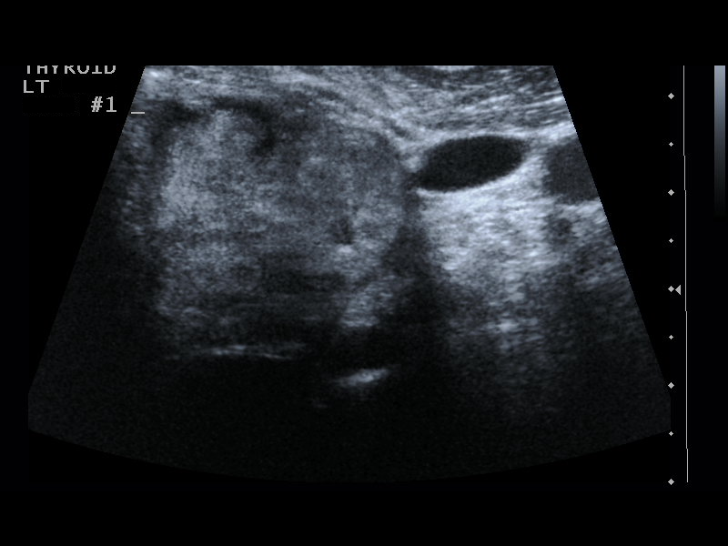
[im 8/17]
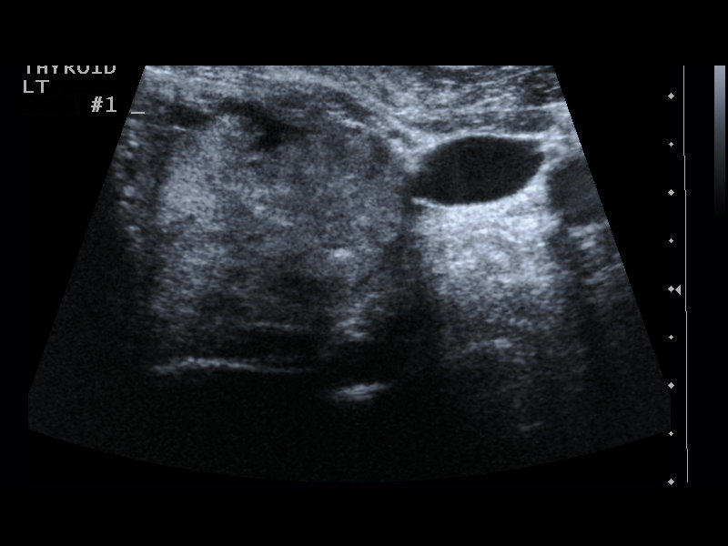
[im 9/17]
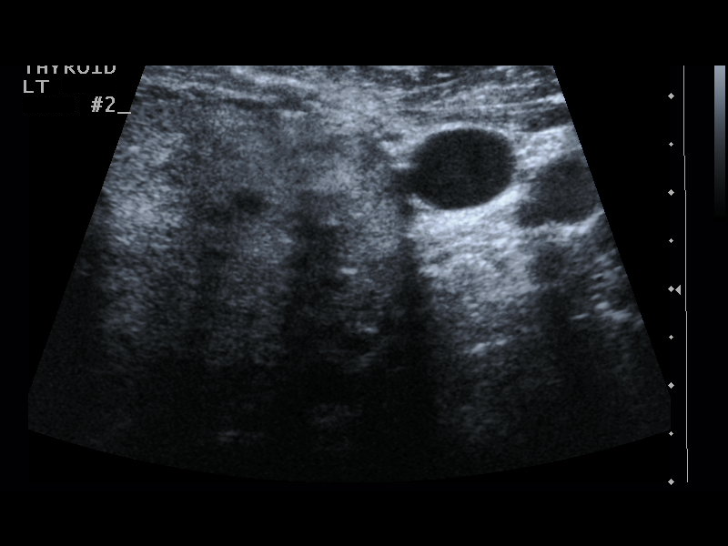
[im 10/17]
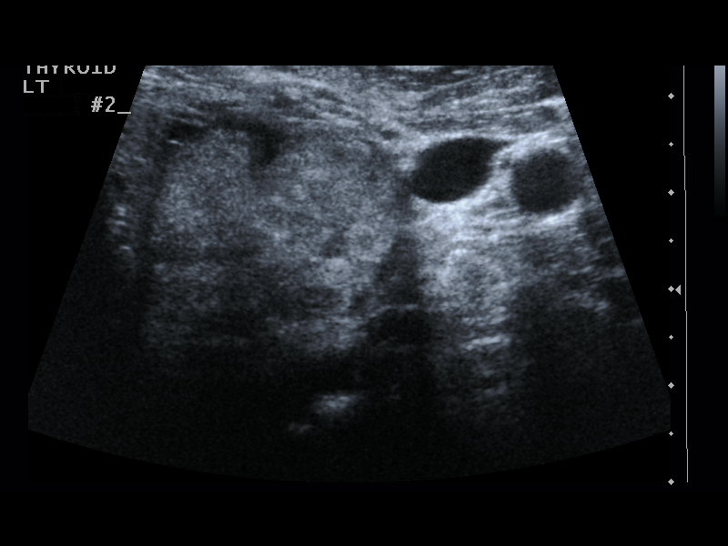
[im 12/17]
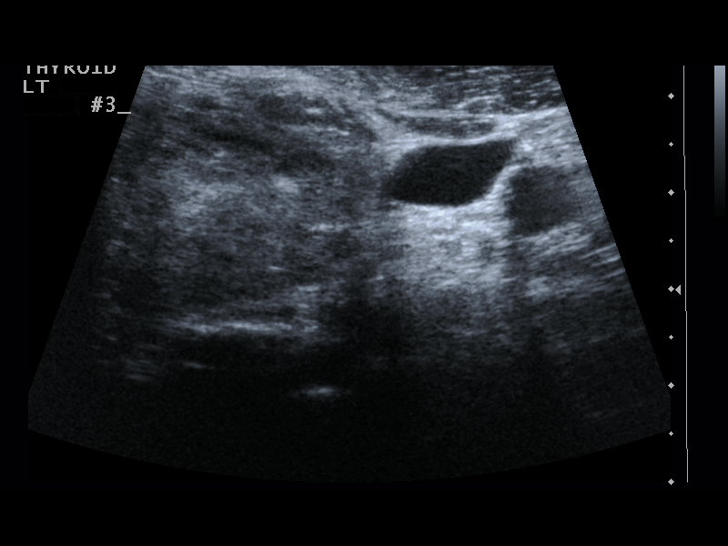
[im 13/17]
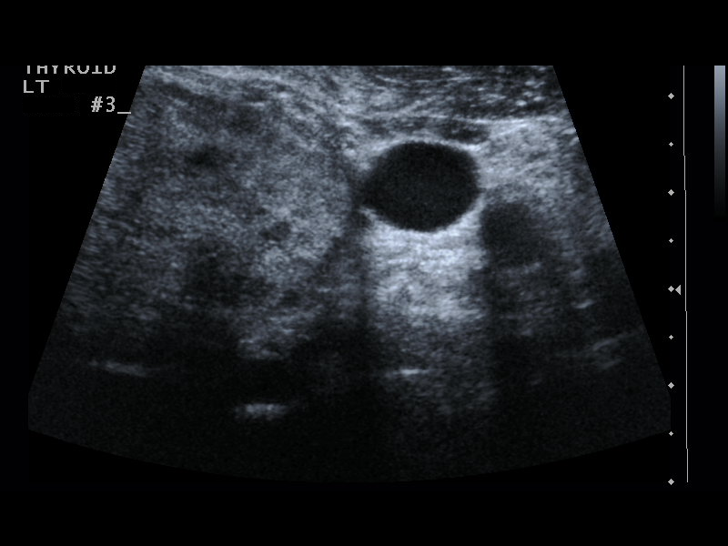
[im 14/17]
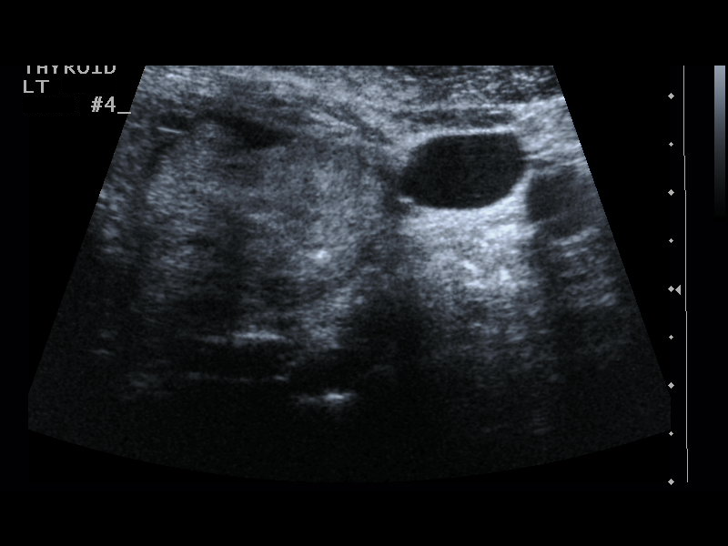
[im 16/17]
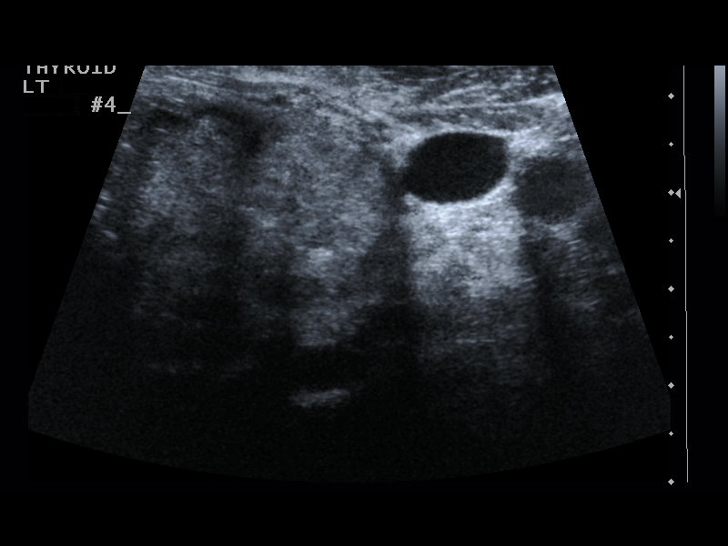
[im 17/17]
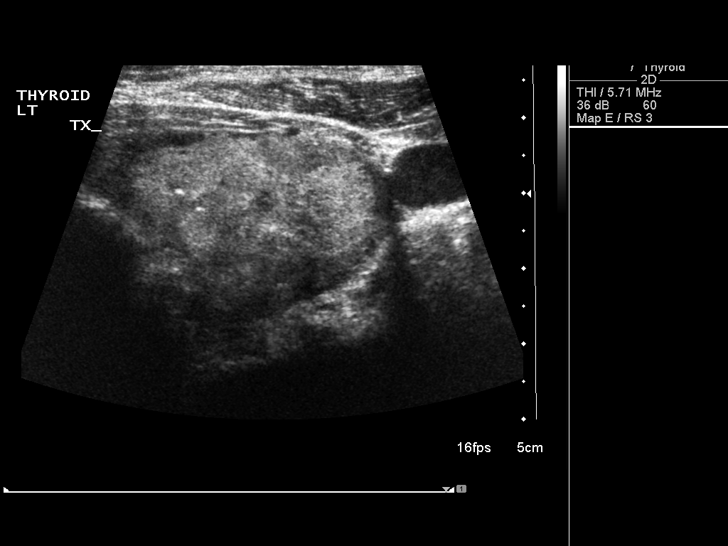

[13 of 17 positions shown; findings below may reference images not displayed]

PROCEDURE:
Thyroid biopsy was thoroughly discussed with the patient and
questions were answered. The benefits, risks, alternatives, and
complications were also discussed. The patient understands and
wishes to proceed with the procedure. Written consent was obtained.



Complications:  None immediate
FINDINGS: Imaging confirms needle placement into the large left thyroid nodule
IMPRESSION: Ultrasound guided needle aspirate biopsy performed of the left
thyroid nodule.

## 2016-02-04 DIAGNOSIS — E049 Nontoxic goiter, unspecified: Secondary | ICD-10-CM | POA: Diagnosis not present

## 2016-02-10 ENCOUNTER — Inpatient Hospital Stay: Admission: RE | Admit: 2016-02-10 | Payer: Medicare Other | Source: Ambulatory Visit

## 2016-06-15 ENCOUNTER — Other Ambulatory Visit: Payer: Self-pay | Admitting: Endocrinology

## 2016-06-15 DIAGNOSIS — E049 Nontoxic goiter, unspecified: Secondary | ICD-10-CM

## 2016-06-21 ENCOUNTER — Ambulatory Visit
Admission: RE | Admit: 2016-06-21 | Discharge: 2016-06-21 | Disposition: A | Discharge status: Home / Self Care | Payer: Medicare Other | Source: Ambulatory Visit | Attending: Endocrinology | Admitting: Endocrinology

## 2016-06-21 DIAGNOSIS — E042 Nontoxic multinodular goiter: Secondary | ICD-10-CM | POA: Diagnosis not present

## 2016-06-21 DIAGNOSIS — E049 Nontoxic goiter, unspecified: Secondary | ICD-10-CM

## 2016-11-01 DIAGNOSIS — E782 Mixed hyperlipidemia: Secondary | ICD-10-CM | POA: Diagnosis not present

## 2016-11-01 DIAGNOSIS — N183 Chronic kidney disease, stage 3 (moderate): Secondary | ICD-10-CM | POA: Diagnosis not present

## 2016-11-01 DIAGNOSIS — E049 Nontoxic goiter, unspecified: Secondary | ICD-10-CM | POA: Diagnosis not present

## 2016-11-08 DIAGNOSIS — N183 Chronic kidney disease, stage 3 (moderate): Secondary | ICD-10-CM | POA: Diagnosis not present

## 2016-11-08 DIAGNOSIS — E782 Mixed hyperlipidemia: Secondary | ICD-10-CM | POA: Diagnosis not present

## 2016-11-08 DIAGNOSIS — N390 Urinary tract infection, site not specified: Secondary | ICD-10-CM | POA: Diagnosis not present

## 2016-11-08 DIAGNOSIS — M19012 Primary osteoarthritis, left shoulder: Secondary | ICD-10-CM | POA: Diagnosis not present

## 2017-01-26 DIAGNOSIS — N390 Urinary tract infection, site not specified: Secondary | ICD-10-CM | POA: Diagnosis not present

## 2017-01-26 DIAGNOSIS — R300 Dysuria: Secondary | ICD-10-CM | POA: Diagnosis not present

## 2017-05-03 DIAGNOSIS — M4186 Other forms of scoliosis, lumbar region: Secondary | ICD-10-CM | POA: Diagnosis not present

## 2017-05-03 DIAGNOSIS — M47814 Spondylosis without myelopathy or radiculopathy, thoracic region: Secondary | ICD-10-CM | POA: Diagnosis not present

## 2017-05-03 DIAGNOSIS — M48061 Spinal stenosis, lumbar region without neurogenic claudication: Secondary | ICD-10-CM | POA: Diagnosis not present

## 2017-05-03 DIAGNOSIS — M5412 Radiculopathy, cervical region: Secondary | ICD-10-CM | POA: Diagnosis not present

## 2017-05-03 DIAGNOSIS — Z981 Arthrodesis status: Secondary | ICD-10-CM | POA: Diagnosis not present

## 2017-05-16 DIAGNOSIS — G9619 Other disorders of meninges, not elsewhere classified: Secondary | ICD-10-CM | POA: Diagnosis not present

## 2017-05-16 DIAGNOSIS — M9970 Connective tissue and disc stenosis of intervertebral foramina of head region: Secondary | ICD-10-CM | POA: Diagnosis not present

## 2017-05-16 DIAGNOSIS — Z981 Arthrodesis status: Secondary | ICD-10-CM | POA: Diagnosis not present

## 2017-05-16 DIAGNOSIS — M47812 Spondylosis without myelopathy or radiculopathy, cervical region: Secondary | ICD-10-CM | POA: Diagnosis not present

## 2017-05-16 DIAGNOSIS — E0789 Other specified disorders of thyroid: Secondary | ICD-10-CM | POA: Diagnosis not present

## 2017-05-22 DIAGNOSIS — H25013 Cortical age-related cataract, bilateral: Secondary | ICD-10-CM | POA: Diagnosis not present

## 2017-05-22 DIAGNOSIS — H353132 Nonexudative age-related macular degeneration, bilateral, intermediate dry stage: Secondary | ICD-10-CM | POA: Diagnosis not present

## 2017-05-22 DIAGNOSIS — H25012 Cortical age-related cataract, left eye: Secondary | ICD-10-CM | POA: Diagnosis not present

## 2017-05-22 DIAGNOSIS — H2512 Age-related nuclear cataract, left eye: Secondary | ICD-10-CM | POA: Diagnosis not present

## 2017-05-22 DIAGNOSIS — H35033 Hypertensive retinopathy, bilateral: Secondary | ICD-10-CM | POA: Diagnosis not present

## 2017-05-22 DIAGNOSIS — H2513 Age-related nuclear cataract, bilateral: Secondary | ICD-10-CM | POA: Diagnosis not present

## 2017-05-31 ENCOUNTER — Other Ambulatory Visit: Payer: Self-pay | Admitting: Dermatology

## 2017-05-31 DIAGNOSIS — D229 Melanocytic nevi, unspecified: Secondary | ICD-10-CM | POA: Diagnosis not present

## 2017-05-31 DIAGNOSIS — L820 Inflamed seborrheic keratosis: Secondary | ICD-10-CM | POA: Diagnosis not present

## 2017-05-31 DIAGNOSIS — L720 Epidermal cyst: Secondary | ICD-10-CM | POA: Diagnosis not present

## 2017-05-31 DIAGNOSIS — D492 Neoplasm of unspecified behavior of bone, soft tissue, and skin: Secondary | ICD-10-CM | POA: Diagnosis not present

## 2017-06-08 DIAGNOSIS — I10 Essential (primary) hypertension: Secondary | ICD-10-CM | POA: Diagnosis not present

## 2017-06-08 DIAGNOSIS — E782 Mixed hyperlipidemia: Secondary | ICD-10-CM | POA: Diagnosis not present

## 2017-06-08 DIAGNOSIS — N183 Chronic kidney disease, stage 3 (moderate): Secondary | ICD-10-CM | POA: Diagnosis not present

## 2017-06-15 DIAGNOSIS — N390 Urinary tract infection, site not specified: Secondary | ICD-10-CM | POA: Diagnosis not present

## 2017-06-15 DIAGNOSIS — Z23 Encounter for immunization: Secondary | ICD-10-CM | POA: Diagnosis not present

## 2017-06-15 DIAGNOSIS — E049 Nontoxic goiter, unspecified: Secondary | ICD-10-CM | POA: Diagnosis not present

## 2017-07-04 DIAGNOSIS — Z0000 Encounter for general adult medical examination without abnormal findings: Secondary | ICD-10-CM | POA: Diagnosis not present

## 2017-07-11 DIAGNOSIS — I10 Essential (primary) hypertension: Secondary | ICD-10-CM | POA: Diagnosis not present

## 2017-07-11 DIAGNOSIS — E559 Vitamin D deficiency, unspecified: Secondary | ICD-10-CM | POA: Diagnosis not present

## 2017-07-11 DIAGNOSIS — N183 Chronic kidney disease, stage 3 (moderate): Secondary | ICD-10-CM | POA: Diagnosis not present

## 2017-07-11 DIAGNOSIS — E049 Nontoxic goiter, unspecified: Secondary | ICD-10-CM | POA: Diagnosis not present

## 2017-07-11 DIAGNOSIS — E782 Mixed hyperlipidemia: Secondary | ICD-10-CM | POA: Diagnosis not present

## 2017-07-11 DIAGNOSIS — M5136 Other intervertebral disc degeneration, lumbar region: Secondary | ICD-10-CM | POA: Diagnosis not present

## 2017-07-11 DIAGNOSIS — K219 Gastro-esophageal reflux disease without esophagitis: Secondary | ICD-10-CM | POA: Diagnosis not present

## 2017-07-11 DIAGNOSIS — M19012 Primary osteoarthritis, left shoulder: Secondary | ICD-10-CM | POA: Diagnosis not present

## 2017-07-25 DIAGNOSIS — H2512 Age-related nuclear cataract, left eye: Secondary | ICD-10-CM | POA: Diagnosis not present

## 2017-07-25 DIAGNOSIS — H25812 Combined forms of age-related cataract, left eye: Secondary | ICD-10-CM | POA: Diagnosis not present

## 2017-08-03 DIAGNOSIS — H2511 Age-related nuclear cataract, right eye: Secondary | ICD-10-CM | POA: Diagnosis not present

## 2017-08-03 DIAGNOSIS — H25011 Cortical age-related cataract, right eye: Secondary | ICD-10-CM | POA: Diagnosis not present

## 2017-08-15 DIAGNOSIS — H25811 Combined forms of age-related cataract, right eye: Secondary | ICD-10-CM | POA: Diagnosis not present

## 2017-08-15 DIAGNOSIS — H2511 Age-related nuclear cataract, right eye: Secondary | ICD-10-CM | POA: Diagnosis not present

## 2017-08-28 DIAGNOSIS — N390 Urinary tract infection, site not specified: Secondary | ICD-10-CM | POA: Diagnosis not present

## 2018-01-23 DIAGNOSIS — E782 Mixed hyperlipidemia: Secondary | ICD-10-CM | POA: Diagnosis not present

## 2018-01-23 DIAGNOSIS — M25552 Pain in left hip: Secondary | ICD-10-CM | POA: Diagnosis not present

## 2018-01-23 DIAGNOSIS — M19012 Primary osteoarthritis, left shoulder: Secondary | ICD-10-CM | POA: Diagnosis not present

## 2018-01-23 DIAGNOSIS — I10 Essential (primary) hypertension: Secondary | ICD-10-CM | POA: Diagnosis not present

## 2018-01-23 DIAGNOSIS — N183 Chronic kidney disease, stage 3 (moderate): Secondary | ICD-10-CM | POA: Diagnosis not present

## 2018-01-23 DIAGNOSIS — M160 Bilateral primary osteoarthritis of hip: Secondary | ICD-10-CM | POA: Diagnosis not present

## 2018-02-06 DIAGNOSIS — E871 Hypo-osmolality and hyponatremia: Secondary | ICD-10-CM | POA: Diagnosis not present

## 2018-02-21 DIAGNOSIS — E871 Hypo-osmolality and hyponatremia: Secondary | ICD-10-CM | POA: Diagnosis not present

## 2018-02-22 DIAGNOSIS — E871 Hypo-osmolality and hyponatremia: Secondary | ICD-10-CM | POA: Diagnosis not present

## 2018-02-22 DIAGNOSIS — M5136 Other intervertebral disc degeneration, lumbar region: Secondary | ICD-10-CM | POA: Diagnosis not present

## 2018-02-27 DIAGNOSIS — M25551 Pain in right hip: Secondary | ICD-10-CM | POA: Diagnosis not present

## 2018-02-27 DIAGNOSIS — M545 Low back pain: Secondary | ICD-10-CM | POA: Diagnosis not present

## 2018-02-27 DIAGNOSIS — Z981 Arthrodesis status: Secondary | ICD-10-CM | POA: Diagnosis not present

## 2018-02-27 DIAGNOSIS — M8588 Other specified disorders of bone density and structure, other site: Secondary | ICD-10-CM | POA: Diagnosis not present

## 2018-02-27 DIAGNOSIS — M5114 Intervertebral disc disorders with radiculopathy, thoracic region: Secondary | ICD-10-CM | POA: Diagnosis not present

## 2018-02-27 DIAGNOSIS — M160 Bilateral primary osteoarthritis of hip: Secondary | ICD-10-CM | POA: Diagnosis not present

## 2018-02-27 DIAGNOSIS — M19012 Primary osteoarthritis, left shoulder: Secondary | ICD-10-CM | POA: Diagnosis not present

## 2018-02-27 DIAGNOSIS — M4316 Spondylolisthesis, lumbar region: Secondary | ICD-10-CM | POA: Diagnosis not present

## 2018-02-27 DIAGNOSIS — M25552 Pain in left hip: Secondary | ICD-10-CM | POA: Diagnosis not present

## 2018-02-27 DIAGNOSIS — M19011 Primary osteoarthritis, right shoulder: Secondary | ICD-10-CM | POA: Diagnosis not present

## 2018-02-27 DIAGNOSIS — M5116 Intervertebral disc disorders with radiculopathy, lumbar region: Secondary | ICD-10-CM | POA: Diagnosis not present

## 2018-03-05 DIAGNOSIS — M4726 Other spondylosis with radiculopathy, lumbar region: Secondary | ICD-10-CM | POA: Diagnosis not present

## 2018-03-05 DIAGNOSIS — M4727 Other spondylosis with radiculopathy, lumbosacral region: Secondary | ICD-10-CM | POA: Diagnosis not present

## 2018-03-05 DIAGNOSIS — M5416 Radiculopathy, lumbar region: Secondary | ICD-10-CM | POA: Diagnosis not present

## 2018-03-05 DIAGNOSIS — M438X6 Other specified deforming dorsopathies, lumbar region: Secondary | ICD-10-CM | POA: Diagnosis not present

## 2018-03-05 DIAGNOSIS — M5116 Intervertebral disc disorders with radiculopathy, lumbar region: Secondary | ICD-10-CM | POA: Diagnosis not present

## 2018-03-05 DIAGNOSIS — M5412 Radiculopathy, cervical region: Secondary | ICD-10-CM | POA: Diagnosis not present

## 2018-03-05 DIAGNOSIS — M5136 Other intervertebral disc degeneration, lumbar region: Secondary | ICD-10-CM | POA: Diagnosis not present

## 2018-03-05 DIAGNOSIS — M48061 Spinal stenosis, lumbar region without neurogenic claudication: Secondary | ICD-10-CM | POA: Diagnosis not present

## 2018-03-05 DIAGNOSIS — M5117 Intervertebral disc disorders with radiculopathy, lumbosacral region: Secondary | ICD-10-CM | POA: Diagnosis not present

## 2018-03-06 DIAGNOSIS — M48061 Spinal stenosis, lumbar region without neurogenic claudication: Secondary | ICD-10-CM | POA: Diagnosis not present

## 2018-03-06 DIAGNOSIS — M4150 Other secondary scoliosis, site unspecified: Secondary | ICD-10-CM | POA: Diagnosis not present

## 2018-03-13 DIAGNOSIS — M4807 Spinal stenosis, lumbosacral region: Secondary | ICD-10-CM | POA: Diagnosis not present

## 2018-03-13 DIAGNOSIS — M5417 Radiculopathy, lumbosacral region: Secondary | ICD-10-CM | POA: Diagnosis not present

## 2018-04-04 DIAGNOSIS — H353132 Nonexudative age-related macular degeneration, bilateral, intermediate dry stage: Secondary | ICD-10-CM | POA: Diagnosis not present

## 2018-04-04 DIAGNOSIS — H26493 Other secondary cataract, bilateral: Secondary | ICD-10-CM | POA: Diagnosis not present

## 2018-04-04 DIAGNOSIS — H35033 Hypertensive retinopathy, bilateral: Secondary | ICD-10-CM | POA: Diagnosis not present

## 2018-04-04 DIAGNOSIS — Z961 Presence of intraocular lens: Secondary | ICD-10-CM | POA: Diagnosis not present

## 2018-04-05 DIAGNOSIS — E871 Hypo-osmolality and hyponatremia: Secondary | ICD-10-CM | POA: Diagnosis not present

## 2018-04-11 DIAGNOSIS — E871 Hypo-osmolality and hyponatremia: Secondary | ICD-10-CM | POA: Diagnosis not present

## 2018-04-11 DIAGNOSIS — N183 Chronic kidney disease, stage 3 (moderate): Secondary | ICD-10-CM | POA: Diagnosis not present

## 2018-05-22 DIAGNOSIS — M419 Scoliosis, unspecified: Secondary | ICD-10-CM | POA: Diagnosis not present

## 2018-05-22 DIAGNOSIS — Z885 Allergy status to narcotic agent status: Secondary | ICD-10-CM | POA: Diagnosis not present

## 2018-05-22 DIAGNOSIS — M4150 Other secondary scoliosis, site unspecified: Secondary | ICD-10-CM | POA: Diagnosis not present

## 2018-05-22 DIAGNOSIS — Z882 Allergy status to sulfonamides status: Secondary | ICD-10-CM | POA: Diagnosis not present

## 2018-05-24 ENCOUNTER — Other Ambulatory Visit: Payer: Self-pay | Admitting: Endocrinology

## 2018-05-24 DIAGNOSIS — E049 Nontoxic goiter, unspecified: Secondary | ICD-10-CM

## 2018-06-04 ENCOUNTER — Ambulatory Visit
Admission: RE | Admit: 2018-06-04 | Discharge: 2018-06-04 | Disposition: A | Discharge status: Home / Self Care | Payer: Medicare Other | Source: Ambulatory Visit | Attending: Endocrinology | Admitting: Endocrinology

## 2018-06-04 DIAGNOSIS — E049 Nontoxic goiter, unspecified: Secondary | ICD-10-CM

## 2018-06-04 DIAGNOSIS — E041 Nontoxic single thyroid nodule: Secondary | ICD-10-CM | POA: Diagnosis not present

## 2018-06-19 DIAGNOSIS — M4807 Spinal stenosis, lumbosacral region: Secondary | ICD-10-CM | POA: Diagnosis not present

## 2018-06-19 DIAGNOSIS — M5417 Radiculopathy, lumbosacral region: Secondary | ICD-10-CM | POA: Diagnosis not present

## 2018-06-20 DIAGNOSIS — E049 Nontoxic goiter, unspecified: Secondary | ICD-10-CM | POA: Diagnosis not present

## 2018-06-20 DIAGNOSIS — Z23 Encounter for immunization: Secondary | ICD-10-CM | POA: Diagnosis not present

## 2018-07-01 DIAGNOSIS — R8271 Bacteriuria: Secondary | ICD-10-CM | POA: Diagnosis not present

## 2018-07-01 DIAGNOSIS — E785 Hyperlipidemia, unspecified: Secondary | ICD-10-CM | POA: Diagnosis not present

## 2018-07-01 DIAGNOSIS — Z981 Arthrodesis status: Secondary | ICD-10-CM | POA: Diagnosis not present

## 2018-07-01 DIAGNOSIS — G8929 Other chronic pain: Secondary | ICD-10-CM | POA: Diagnosis not present

## 2018-07-01 DIAGNOSIS — I10 Essential (primary) hypertension: Secondary | ICD-10-CM | POA: Diagnosis not present

## 2018-07-01 DIAGNOSIS — M48061 Spinal stenosis, lumbar region without neurogenic claudication: Secondary | ICD-10-CM | POA: Diagnosis not present

## 2018-07-01 DIAGNOSIS — H8112 Benign paroxysmal vertigo, left ear: Secondary | ICD-10-CM | POA: Diagnosis not present

## 2018-07-01 DIAGNOSIS — R42 Dizziness and giddiness: Secondary | ICD-10-CM | POA: Diagnosis not present

## 2018-07-01 DIAGNOSIS — M5416 Radiculopathy, lumbar region: Secondary | ICD-10-CM | POA: Diagnosis not present

## 2018-07-01 DIAGNOSIS — K219 Gastro-esophageal reflux disease without esophagitis: Secondary | ICD-10-CM | POA: Diagnosis not present

## 2018-07-01 DIAGNOSIS — N390 Urinary tract infection, site not specified: Secondary | ICD-10-CM | POA: Diagnosis not present

## 2018-07-01 DIAGNOSIS — M549 Dorsalgia, unspecified: Secondary | ICD-10-CM | POA: Diagnosis not present

## 2018-07-01 DIAGNOSIS — M5412 Radiculopathy, cervical region: Secondary | ICD-10-CM | POA: Diagnosis not present

## 2018-07-01 DIAGNOSIS — N3000 Acute cystitis without hematuria: Secondary | ICD-10-CM | POA: Diagnosis not present

## 2018-07-02 DIAGNOSIS — Z96653 Presence of artificial knee joint, bilateral: Secondary | ICD-10-CM | POA: Diagnosis present

## 2018-07-02 DIAGNOSIS — Z90710 Acquired absence of both cervix and uterus: Secondary | ICD-10-CM | POA: Diagnosis not present

## 2018-07-02 DIAGNOSIS — K219 Gastro-esophageal reflux disease without esophagitis: Secondary | ICD-10-CM | POA: Diagnosis present

## 2018-07-02 DIAGNOSIS — M419 Scoliosis, unspecified: Secondary | ICD-10-CM | POA: Diagnosis present

## 2018-07-02 DIAGNOSIS — R8271 Bacteriuria: Secondary | ICD-10-CM | POA: Diagnosis present

## 2018-07-02 DIAGNOSIS — G8929 Other chronic pain: Secondary | ICD-10-CM | POA: Diagnosis present

## 2018-07-02 DIAGNOSIS — R42 Dizziness and giddiness: Secondary | ICD-10-CM | POA: Diagnosis not present

## 2018-07-02 DIAGNOSIS — Z833 Family history of diabetes mellitus: Secondary | ICD-10-CM | POA: Diagnosis not present

## 2018-07-02 DIAGNOSIS — M48061 Spinal stenosis, lumbar region without neurogenic claudication: Secondary | ICD-10-CM | POA: Diagnosis not present

## 2018-07-02 DIAGNOSIS — I10 Essential (primary) hypertension: Secondary | ICD-10-CM | POA: Diagnosis present

## 2018-07-02 DIAGNOSIS — N390 Urinary tract infection, site not specified: Secondary | ICD-10-CM | POA: Diagnosis not present

## 2018-07-02 DIAGNOSIS — Z981 Arthrodesis status: Secondary | ICD-10-CM | POA: Diagnosis not present

## 2018-07-02 DIAGNOSIS — Z8249 Family history of ischemic heart disease and other diseases of the circulatory system: Secondary | ICD-10-CM | POA: Diagnosis not present

## 2018-07-02 DIAGNOSIS — M5416 Radiculopathy, lumbar region: Secondary | ICD-10-CM | POA: Diagnosis not present

## 2018-07-02 DIAGNOSIS — E785 Hyperlipidemia, unspecified: Secondary | ICD-10-CM | POA: Diagnosis present

## 2018-07-02 DIAGNOSIS — G4700 Insomnia, unspecified: Secondary | ICD-10-CM | POA: Diagnosis present

## 2018-07-02 DIAGNOSIS — Z87440 Personal history of urinary (tract) infections: Secondary | ICD-10-CM | POA: Diagnosis not present

## 2018-07-02 DIAGNOSIS — M5412 Radiculopathy, cervical region: Secondary | ICD-10-CM | POA: Diagnosis not present

## 2018-07-02 DIAGNOSIS — Z87891 Personal history of nicotine dependence: Secondary | ICD-10-CM | POA: Diagnosis not present

## 2018-07-02 DIAGNOSIS — H8112 Benign paroxysmal vertigo, left ear: Secondary | ICD-10-CM | POA: Diagnosis present

## 2018-07-02 DIAGNOSIS — Z825 Family history of asthma and other chronic lower respiratory diseases: Secondary | ICD-10-CM | POA: Diagnosis not present

## 2018-07-02 DIAGNOSIS — N3000 Acute cystitis without hematuria: Secondary | ICD-10-CM | POA: Diagnosis not present

## 2018-07-06 DIAGNOSIS — Z87440 Personal history of urinary (tract) infections: Secondary | ICD-10-CM | POA: Diagnosis not present

## 2018-07-06 DIAGNOSIS — I10 Essential (primary) hypertension: Secondary | ICD-10-CM | POA: Diagnosis not present

## 2018-07-06 DIAGNOSIS — M5412 Radiculopathy, cervical region: Secondary | ICD-10-CM | POA: Diagnosis not present

## 2018-07-06 DIAGNOSIS — Z96653 Presence of artificial knee joint, bilateral: Secondary | ICD-10-CM | POA: Diagnosis not present

## 2018-07-06 DIAGNOSIS — K219 Gastro-esophageal reflux disease without esophagitis: Secondary | ICD-10-CM | POA: Diagnosis not present

## 2018-07-06 DIAGNOSIS — H8110 Benign paroxysmal vertigo, unspecified ear: Secondary | ICD-10-CM | POA: Diagnosis not present

## 2018-07-06 DIAGNOSIS — Z87891 Personal history of nicotine dependence: Secondary | ICD-10-CM | POA: Diagnosis not present

## 2018-07-06 DIAGNOSIS — E785 Hyperlipidemia, unspecified: Secondary | ICD-10-CM | POA: Diagnosis not present

## 2018-07-06 DIAGNOSIS — G4700 Insomnia, unspecified: Secondary | ICD-10-CM | POA: Diagnosis not present

## 2018-07-06 DIAGNOSIS — Z9181 History of falling: Secondary | ICD-10-CM | POA: Diagnosis not present

## 2018-07-06 DIAGNOSIS — M5416 Radiculopathy, lumbar region: Secondary | ICD-10-CM | POA: Diagnosis not present

## 2018-07-06 DIAGNOSIS — M419 Scoliosis, unspecified: Secondary | ICD-10-CM | POA: Diagnosis not present

## 2018-07-06 DIAGNOSIS — M1990 Unspecified osteoarthritis, unspecified site: Secondary | ICD-10-CM | POA: Diagnosis not present

## 2018-07-06 DIAGNOSIS — M48061 Spinal stenosis, lumbar region without neurogenic claudication: Secondary | ICD-10-CM | POA: Diagnosis not present

## 2018-07-06 DIAGNOSIS — Z981 Arthrodesis status: Secondary | ICD-10-CM | POA: Diagnosis not present

## 2018-07-11 DIAGNOSIS — H8110 Benign paroxysmal vertigo, unspecified ear: Secondary | ICD-10-CM | POA: Diagnosis not present

## 2018-07-11 DIAGNOSIS — M48061 Spinal stenosis, lumbar region without neurogenic claudication: Secondary | ICD-10-CM | POA: Diagnosis not present

## 2018-07-11 DIAGNOSIS — M419 Scoliosis, unspecified: Secondary | ICD-10-CM | POA: Diagnosis not present

## 2018-07-11 DIAGNOSIS — M5412 Radiculopathy, cervical region: Secondary | ICD-10-CM | POA: Diagnosis not present

## 2018-07-11 DIAGNOSIS — M5416 Radiculopathy, lumbar region: Secondary | ICD-10-CM | POA: Diagnosis not present

## 2018-07-11 DIAGNOSIS — M1990 Unspecified osteoarthritis, unspecified site: Secondary | ICD-10-CM | POA: Diagnosis not present

## 2018-07-13 DIAGNOSIS — M5412 Radiculopathy, cervical region: Secondary | ICD-10-CM | POA: Diagnosis not present

## 2018-07-13 DIAGNOSIS — M5416 Radiculopathy, lumbar region: Secondary | ICD-10-CM | POA: Diagnosis not present

## 2018-07-13 DIAGNOSIS — M1990 Unspecified osteoarthritis, unspecified site: Secondary | ICD-10-CM | POA: Diagnosis not present

## 2018-07-13 DIAGNOSIS — H8110 Benign paroxysmal vertigo, unspecified ear: Secondary | ICD-10-CM | POA: Diagnosis not present

## 2018-07-13 DIAGNOSIS — M48061 Spinal stenosis, lumbar region without neurogenic claudication: Secondary | ICD-10-CM | POA: Diagnosis not present

## 2018-07-13 DIAGNOSIS — M419 Scoliosis, unspecified: Secondary | ICD-10-CM | POA: Diagnosis not present

## 2018-07-17 DIAGNOSIS — E782 Mixed hyperlipidemia: Secondary | ICD-10-CM | POA: Diagnosis not present

## 2018-07-17 DIAGNOSIS — M5416 Radiculopathy, lumbar region: Secondary | ICD-10-CM | POA: Diagnosis not present

## 2018-07-17 DIAGNOSIS — Z0000 Encounter for general adult medical examination without abnormal findings: Secondary | ICD-10-CM | POA: Diagnosis not present

## 2018-07-17 DIAGNOSIS — M419 Scoliosis, unspecified: Secondary | ICD-10-CM | POA: Diagnosis not present

## 2018-07-17 DIAGNOSIS — M48061 Spinal stenosis, lumbar region without neurogenic claudication: Secondary | ICD-10-CM | POA: Diagnosis not present

## 2018-07-17 DIAGNOSIS — E049 Nontoxic goiter, unspecified: Secondary | ICD-10-CM | POA: Diagnosis not present

## 2018-07-17 DIAGNOSIS — H8110 Benign paroxysmal vertigo, unspecified ear: Secondary | ICD-10-CM | POA: Diagnosis not present

## 2018-07-17 DIAGNOSIS — N183 Chronic kidney disease, stage 3 (moderate): Secondary | ICD-10-CM | POA: Diagnosis not present

## 2018-07-17 DIAGNOSIS — M5412 Radiculopathy, cervical region: Secondary | ICD-10-CM | POA: Diagnosis not present

## 2018-07-17 DIAGNOSIS — Z780 Asymptomatic menopausal state: Secondary | ICD-10-CM | POA: Diagnosis not present

## 2018-07-17 DIAGNOSIS — M1990 Unspecified osteoarthritis, unspecified site: Secondary | ICD-10-CM | POA: Diagnosis not present

## 2018-07-17 DIAGNOSIS — I10 Essential (primary) hypertension: Secondary | ICD-10-CM | POA: Diagnosis not present

## 2018-07-19 DIAGNOSIS — H8110 Benign paroxysmal vertigo, unspecified ear: Secondary | ICD-10-CM | POA: Diagnosis not present

## 2018-07-19 DIAGNOSIS — M5412 Radiculopathy, cervical region: Secondary | ICD-10-CM | POA: Diagnosis not present

## 2018-07-19 DIAGNOSIS — M419 Scoliosis, unspecified: Secondary | ICD-10-CM | POA: Diagnosis not present

## 2018-07-19 DIAGNOSIS — M48061 Spinal stenosis, lumbar region without neurogenic claudication: Secondary | ICD-10-CM | POA: Diagnosis not present

## 2018-07-20 DIAGNOSIS — M1990 Unspecified osteoarthritis, unspecified site: Secondary | ICD-10-CM | POA: Diagnosis not present

## 2018-07-20 DIAGNOSIS — M419 Scoliosis, unspecified: Secondary | ICD-10-CM | POA: Diagnosis not present

## 2018-07-20 DIAGNOSIS — M48061 Spinal stenosis, lumbar region without neurogenic claudication: Secondary | ICD-10-CM | POA: Diagnosis not present

## 2018-07-20 DIAGNOSIS — H8110 Benign paroxysmal vertigo, unspecified ear: Secondary | ICD-10-CM | POA: Diagnosis not present

## 2018-07-20 DIAGNOSIS — M5416 Radiculopathy, lumbar region: Secondary | ICD-10-CM | POA: Diagnosis not present

## 2018-07-20 DIAGNOSIS — M5412 Radiculopathy, cervical region: Secondary | ICD-10-CM | POA: Diagnosis not present

## 2018-07-23 DIAGNOSIS — M1990 Unspecified osteoarthritis, unspecified site: Secondary | ICD-10-CM | POA: Diagnosis not present

## 2018-07-23 DIAGNOSIS — M419 Scoliosis, unspecified: Secondary | ICD-10-CM | POA: Diagnosis not present

## 2018-07-23 DIAGNOSIS — M5416 Radiculopathy, lumbar region: Secondary | ICD-10-CM | POA: Diagnosis not present

## 2018-07-23 DIAGNOSIS — M48061 Spinal stenosis, lumbar region without neurogenic claudication: Secondary | ICD-10-CM | POA: Diagnosis not present

## 2018-07-23 DIAGNOSIS — M5412 Radiculopathy, cervical region: Secondary | ICD-10-CM | POA: Diagnosis not present

## 2018-07-23 DIAGNOSIS — H8110 Benign paroxysmal vertigo, unspecified ear: Secondary | ICD-10-CM | POA: Diagnosis not present

## 2018-07-24 DIAGNOSIS — M19012 Primary osteoarthritis, left shoulder: Secondary | ICD-10-CM | POA: Diagnosis not present

## 2018-07-24 DIAGNOSIS — M7531 Calcific tendinitis of right shoulder: Secondary | ICD-10-CM | POA: Diagnosis not present

## 2018-07-24 DIAGNOSIS — M5136 Other intervertebral disc degeneration, lumbar region: Secondary | ICD-10-CM | POA: Diagnosis not present

## 2018-07-24 DIAGNOSIS — I10 Essential (primary) hypertension: Secondary | ICD-10-CM | POA: Diagnosis not present

## 2018-07-24 DIAGNOSIS — E049 Nontoxic goiter, unspecified: Secondary | ICD-10-CM | POA: Diagnosis not present

## 2018-07-24 DIAGNOSIS — Z780 Asymptomatic menopausal state: Secondary | ICD-10-CM | POA: Diagnosis not present

## 2018-07-24 DIAGNOSIS — N183 Chronic kidney disease, stage 3 (moderate): Secondary | ICD-10-CM | POA: Diagnosis not present

## 2018-07-24 DIAGNOSIS — E782 Mixed hyperlipidemia: Secondary | ICD-10-CM | POA: Diagnosis not present

## 2018-07-25 DIAGNOSIS — M419 Scoliosis, unspecified: Secondary | ICD-10-CM | POA: Diagnosis not present

## 2018-07-25 DIAGNOSIS — M1990 Unspecified osteoarthritis, unspecified site: Secondary | ICD-10-CM | POA: Diagnosis not present

## 2018-07-25 DIAGNOSIS — M48061 Spinal stenosis, lumbar region without neurogenic claudication: Secondary | ICD-10-CM | POA: Diagnosis not present

## 2018-07-25 DIAGNOSIS — M5416 Radiculopathy, lumbar region: Secondary | ICD-10-CM | POA: Diagnosis not present

## 2018-07-25 DIAGNOSIS — H8110 Benign paroxysmal vertigo, unspecified ear: Secondary | ICD-10-CM | POA: Diagnosis not present

## 2018-07-25 DIAGNOSIS — M5412 Radiculopathy, cervical region: Secondary | ICD-10-CM | POA: Diagnosis not present

## 2018-07-31 DIAGNOSIS — M419 Scoliosis, unspecified: Secondary | ICD-10-CM | POA: Diagnosis not present

## 2018-07-31 DIAGNOSIS — H8110 Benign paroxysmal vertigo, unspecified ear: Secondary | ICD-10-CM | POA: Diagnosis not present

## 2018-07-31 DIAGNOSIS — M1990 Unspecified osteoarthritis, unspecified site: Secondary | ICD-10-CM | POA: Diagnosis not present

## 2018-07-31 DIAGNOSIS — M5416 Radiculopathy, lumbar region: Secondary | ICD-10-CM | POA: Diagnosis not present

## 2018-07-31 DIAGNOSIS — M48061 Spinal stenosis, lumbar region without neurogenic claudication: Secondary | ICD-10-CM | POA: Diagnosis not present

## 2018-07-31 DIAGNOSIS — M5412 Radiculopathy, cervical region: Secondary | ICD-10-CM | POA: Diagnosis not present

## 2018-08-02 DIAGNOSIS — M1990 Unspecified osteoarthritis, unspecified site: Secondary | ICD-10-CM | POA: Diagnosis not present

## 2018-08-02 DIAGNOSIS — M419 Scoliosis, unspecified: Secondary | ICD-10-CM | POA: Diagnosis not present

## 2018-08-02 DIAGNOSIS — H8110 Benign paroxysmal vertigo, unspecified ear: Secondary | ICD-10-CM | POA: Diagnosis not present

## 2018-08-02 DIAGNOSIS — M5416 Radiculopathy, lumbar region: Secondary | ICD-10-CM | POA: Diagnosis not present

## 2018-08-02 DIAGNOSIS — M5412 Radiculopathy, cervical region: Secondary | ICD-10-CM | POA: Diagnosis not present

## 2018-08-02 DIAGNOSIS — M48061 Spinal stenosis, lumbar region without neurogenic claudication: Secondary | ICD-10-CM | POA: Diagnosis not present

## 2018-09-11 DIAGNOSIS — M4156 Other secondary scoliosis, lumbar region: Secondary | ICD-10-CM | POA: Diagnosis not present

## 2018-09-11 DIAGNOSIS — M48061 Spinal stenosis, lumbar region without neurogenic claudication: Secondary | ICD-10-CM | POA: Diagnosis not present

## 2018-09-27 DIAGNOSIS — M48061 Spinal stenosis, lumbar region without neurogenic claudication: Secondary | ICD-10-CM | POA: Diagnosis not present

## 2018-09-27 DIAGNOSIS — R29898 Other symptoms and signs involving the musculoskeletal system: Secondary | ICD-10-CM | POA: Diagnosis not present

## 2018-09-27 DIAGNOSIS — Z789 Other specified health status: Secondary | ICD-10-CM | POA: Diagnosis not present

## 2018-10-02 DIAGNOSIS — R29898 Other symptoms and signs involving the musculoskeletal system: Secondary | ICD-10-CM | POA: Diagnosis not present

## 2018-10-02 DIAGNOSIS — Z789 Other specified health status: Secondary | ICD-10-CM | POA: Diagnosis not present

## 2018-10-02 DIAGNOSIS — M48061 Spinal stenosis, lumbar region without neurogenic claudication: Secondary | ICD-10-CM | POA: Diagnosis not present

## 2018-10-09 DIAGNOSIS — Z789 Other specified health status: Secondary | ICD-10-CM | POA: Diagnosis not present

## 2018-10-09 DIAGNOSIS — M48061 Spinal stenosis, lumbar region without neurogenic claudication: Secondary | ICD-10-CM | POA: Diagnosis not present

## 2018-10-09 DIAGNOSIS — R29898 Other symptoms and signs involving the musculoskeletal system: Secondary | ICD-10-CM | POA: Diagnosis not present

## 2018-10-11 DIAGNOSIS — Z789 Other specified health status: Secondary | ICD-10-CM | POA: Diagnosis not present

## 2018-10-11 DIAGNOSIS — R29898 Other symptoms and signs involving the musculoskeletal system: Secondary | ICD-10-CM | POA: Diagnosis not present

## 2018-10-11 DIAGNOSIS — M48061 Spinal stenosis, lumbar region without neurogenic claudication: Secondary | ICD-10-CM | POA: Diagnosis not present

## 2018-10-16 DIAGNOSIS — I10 Essential (primary) hypertension: Secondary | ICD-10-CM | POA: Diagnosis not present

## 2018-10-16 DIAGNOSIS — Z789 Other specified health status: Secondary | ICD-10-CM | POA: Diagnosis not present

## 2018-10-16 DIAGNOSIS — N183 Chronic kidney disease, stage 3 (moderate): Secondary | ICD-10-CM | POA: Diagnosis not present

## 2018-10-16 DIAGNOSIS — E782 Mixed hyperlipidemia: Secondary | ICD-10-CM | POA: Diagnosis not present

## 2018-10-16 DIAGNOSIS — M48061 Spinal stenosis, lumbar region without neurogenic claudication: Secondary | ICD-10-CM | POA: Diagnosis not present

## 2018-10-16 DIAGNOSIS — R29898 Other symptoms and signs involving the musculoskeletal system: Secondary | ICD-10-CM | POA: Diagnosis not present

## 2018-10-18 DIAGNOSIS — M48061 Spinal stenosis, lumbar region without neurogenic claudication: Secondary | ICD-10-CM | POA: Diagnosis not present

## 2018-10-18 DIAGNOSIS — R29898 Other symptoms and signs involving the musculoskeletal system: Secondary | ICD-10-CM | POA: Diagnosis not present

## 2018-10-18 DIAGNOSIS — Z789 Other specified health status: Secondary | ICD-10-CM | POA: Diagnosis not present

## 2018-10-23 DIAGNOSIS — N183 Chronic kidney disease, stage 3 (moderate): Secondary | ICD-10-CM | POA: Diagnosis not present

## 2018-10-23 DIAGNOSIS — I10 Essential (primary) hypertension: Secondary | ICD-10-CM | POA: Diagnosis not present

## 2018-10-23 DIAGNOSIS — E782 Mixed hyperlipidemia: Secondary | ICD-10-CM | POA: Diagnosis not present

## 2018-10-23 DIAGNOSIS — E871 Hypo-osmolality and hyponatremia: Secondary | ICD-10-CM | POA: Diagnosis not present

## 2018-10-24 DIAGNOSIS — Z789 Other specified health status: Secondary | ICD-10-CM | POA: Diagnosis not present

## 2018-10-24 DIAGNOSIS — M48061 Spinal stenosis, lumbar region without neurogenic claudication: Secondary | ICD-10-CM | POA: Diagnosis not present

## 2018-10-24 DIAGNOSIS — R29898 Other symptoms and signs involving the musculoskeletal system: Secondary | ICD-10-CM | POA: Diagnosis not present

## 2018-10-25 DIAGNOSIS — R29898 Other symptoms and signs involving the musculoskeletal system: Secondary | ICD-10-CM | POA: Diagnosis not present

## 2018-10-25 DIAGNOSIS — Z789 Other specified health status: Secondary | ICD-10-CM | POA: Diagnosis not present

## 2018-10-25 DIAGNOSIS — M48061 Spinal stenosis, lumbar region without neurogenic claudication: Secondary | ICD-10-CM | POA: Diagnosis not present

## 2018-10-31 DIAGNOSIS — Z789 Other specified health status: Secondary | ICD-10-CM | POA: Diagnosis not present

## 2018-10-31 DIAGNOSIS — M48061 Spinal stenosis, lumbar region without neurogenic claudication: Secondary | ICD-10-CM | POA: Diagnosis not present

## 2018-10-31 DIAGNOSIS — R29898 Other symptoms and signs involving the musculoskeletal system: Secondary | ICD-10-CM | POA: Diagnosis not present

## 2018-11-06 DIAGNOSIS — M48061 Spinal stenosis, lumbar region without neurogenic claudication: Secondary | ICD-10-CM | POA: Diagnosis not present

## 2018-11-06 DIAGNOSIS — Z789 Other specified health status: Secondary | ICD-10-CM | POA: Diagnosis not present

## 2018-11-06 DIAGNOSIS — R29898 Other symptoms and signs involving the musculoskeletal system: Secondary | ICD-10-CM | POA: Diagnosis not present

## 2018-11-09 DIAGNOSIS — Z789 Other specified health status: Secondary | ICD-10-CM | POA: Diagnosis not present

## 2018-11-09 DIAGNOSIS — R29898 Other symptoms and signs involving the musculoskeletal system: Secondary | ICD-10-CM | POA: Diagnosis not present

## 2018-11-09 DIAGNOSIS — M48061 Spinal stenosis, lumbar region without neurogenic claudication: Secondary | ICD-10-CM | POA: Diagnosis not present

## 2018-11-13 DIAGNOSIS — R29898 Other symptoms and signs involving the musculoskeletal system: Secondary | ICD-10-CM | POA: Diagnosis not present

## 2018-11-13 DIAGNOSIS — M48061 Spinal stenosis, lumbar region without neurogenic claudication: Secondary | ICD-10-CM | POA: Diagnosis not present

## 2018-11-13 DIAGNOSIS — Z789 Other specified health status: Secondary | ICD-10-CM | POA: Diagnosis not present

## 2019-01-15 DIAGNOSIS — M4150 Other secondary scoliosis, site unspecified: Secondary | ICD-10-CM | POA: Diagnosis not present

## 2019-03-19 DIAGNOSIS — I10 Essential (primary) hypertension: Secondary | ICD-10-CM | POA: Diagnosis not present

## 2019-03-19 DIAGNOSIS — N183 Chronic kidney disease, stage 3 (moderate): Secondary | ICD-10-CM | POA: Diagnosis not present

## 2019-03-19 DIAGNOSIS — E782 Mixed hyperlipidemia: Secondary | ICD-10-CM | POA: Diagnosis not present

## 2019-03-26 DIAGNOSIS — Z7189 Other specified counseling: Secondary | ICD-10-CM | POA: Diagnosis not present

## 2019-03-26 DIAGNOSIS — M779 Enthesopathy, unspecified: Secondary | ICD-10-CM | POA: Diagnosis not present

## 2019-03-26 DIAGNOSIS — I10 Essential (primary) hypertension: Secondary | ICD-10-CM | POA: Diagnosis not present

## 2019-03-26 DIAGNOSIS — E871 Hypo-osmolality and hyponatremia: Secondary | ICD-10-CM | POA: Diagnosis not present

## 2019-03-26 DIAGNOSIS — N183 Chronic kidney disease, stage 3 (moderate): Secondary | ICD-10-CM | POA: Diagnosis not present

## 2019-03-26 DIAGNOSIS — E782 Mixed hyperlipidemia: Secondary | ICD-10-CM | POA: Diagnosis not present

## 2019-04-09 DIAGNOSIS — I10 Essential (primary) hypertension: Secondary | ICD-10-CM | POA: Diagnosis not present

## 2019-04-09 DIAGNOSIS — E871 Hypo-osmolality and hyponatremia: Secondary | ICD-10-CM | POA: Diagnosis not present

## 2019-04-09 DIAGNOSIS — N183 Chronic kidney disease, stage 3 (moderate): Secondary | ICD-10-CM | POA: Diagnosis not present

## 2019-04-09 DIAGNOSIS — R42 Dizziness and giddiness: Secondary | ICD-10-CM | POA: Diagnosis not present

## 2019-04-09 DIAGNOSIS — E782 Mixed hyperlipidemia: Secondary | ICD-10-CM | POA: Diagnosis not present

## 2019-04-09 DIAGNOSIS — Z7189 Other specified counseling: Secondary | ICD-10-CM | POA: Diagnosis not present

## 2019-05-02 DIAGNOSIS — H35033 Hypertensive retinopathy, bilateral: Secondary | ICD-10-CM | POA: Diagnosis not present

## 2019-05-02 DIAGNOSIS — H26493 Other secondary cataract, bilateral: Secondary | ICD-10-CM | POA: Diagnosis not present

## 2019-05-02 DIAGNOSIS — H353132 Nonexudative age-related macular degeneration, bilateral, intermediate dry stage: Secondary | ICD-10-CM | POA: Diagnosis not present

## 2019-05-02 DIAGNOSIS — H31091 Other chorioretinal scars, right eye: Secondary | ICD-10-CM | POA: Diagnosis not present

## 2019-05-02 DIAGNOSIS — H43813 Vitreous degeneration, bilateral: Secondary | ICD-10-CM | POA: Diagnosis not present

## 2019-05-23 DIAGNOSIS — H26492 Other secondary cataract, left eye: Secondary | ICD-10-CM | POA: Diagnosis not present

## 2019-06-04 ENCOUNTER — Other Ambulatory Visit: Payer: Self-pay | Admitting: Endocrinology

## 2019-06-04 DIAGNOSIS — E049 Nontoxic goiter, unspecified: Secondary | ICD-10-CM

## 2019-06-12 ENCOUNTER — Ambulatory Visit
Admission: RE | Admit: 2019-06-12 | Discharge: 2019-06-12 | Disposition: A | Discharge status: Home / Self Care | Payer: Medicare Other | Source: Ambulatory Visit | Attending: Endocrinology | Admitting: Endocrinology

## 2019-06-12 DIAGNOSIS — E049 Nontoxic goiter, unspecified: Secondary | ICD-10-CM

## 2019-06-12 DIAGNOSIS — E041 Nontoxic single thyroid nodule: Secondary | ICD-10-CM | POA: Diagnosis not present

## 2019-06-20 DIAGNOSIS — I10 Essential (primary) hypertension: Secondary | ICD-10-CM | POA: Diagnosis not present

## 2019-06-20 DIAGNOSIS — K921 Melena: Secondary | ICD-10-CM | POA: Diagnosis not present

## 2019-06-20 DIAGNOSIS — R197 Diarrhea, unspecified: Secondary | ICD-10-CM | POA: Diagnosis not present

## 2019-07-01 DIAGNOSIS — H00021 Hordeolum internum right upper eyelid: Secondary | ICD-10-CM | POA: Diagnosis not present

## 2019-07-08 DIAGNOSIS — Z23 Encounter for immunization: Secondary | ICD-10-CM | POA: Diagnosis not present

## 2019-07-23 DIAGNOSIS — N390 Urinary tract infection, site not specified: Secondary | ICD-10-CM | POA: Diagnosis not present

## 2019-07-23 DIAGNOSIS — Z0000 Encounter for general adult medical examination without abnormal findings: Secondary | ICD-10-CM | POA: Diagnosis not present

## 2019-07-23 DIAGNOSIS — N183 Chronic kidney disease, stage 3 unspecified: Secondary | ICD-10-CM | POA: Diagnosis not present

## 2019-07-23 DIAGNOSIS — E782 Mixed hyperlipidemia: Secondary | ICD-10-CM | POA: Diagnosis not present

## 2019-07-23 DIAGNOSIS — E871 Hypo-osmolality and hyponatremia: Secondary | ICD-10-CM | POA: Diagnosis not present

## 2019-07-23 DIAGNOSIS — I10 Essential (primary) hypertension: Secondary | ICD-10-CM | POA: Diagnosis not present

## 2019-07-30 DIAGNOSIS — K219 Gastro-esophageal reflux disease without esophagitis: Secondary | ICD-10-CM | POA: Diagnosis not present

## 2019-07-30 DIAGNOSIS — N1831 Chronic kidney disease, stage 3a: Secondary | ICD-10-CM | POA: Diagnosis not present

## 2019-07-30 DIAGNOSIS — E049 Nontoxic goiter, unspecified: Secondary | ICD-10-CM | POA: Diagnosis not present

## 2019-07-30 DIAGNOSIS — I10 Essential (primary) hypertension: Secondary | ICD-10-CM | POA: Diagnosis not present

## 2019-07-30 DIAGNOSIS — M79645 Pain in left finger(s): Secondary | ICD-10-CM | POA: Diagnosis not present

## 2019-07-30 DIAGNOSIS — E782 Mixed hyperlipidemia: Secondary | ICD-10-CM | POA: Diagnosis not present

## 2019-07-30 DIAGNOSIS — M25519 Pain in unspecified shoulder: Secondary | ICD-10-CM | POA: Diagnosis not present

## 2019-07-30 DIAGNOSIS — M19012 Primary osteoarthritis, left shoulder: Secondary | ICD-10-CM | POA: Diagnosis not present

## 2019-07-30 DIAGNOSIS — N390 Urinary tract infection, site not specified: Secondary | ICD-10-CM | POA: Diagnosis not present

## 2019-07-30 DIAGNOSIS — E559 Vitamin D deficiency, unspecified: Secondary | ICD-10-CM | POA: Diagnosis not present

## 2019-08-07 DIAGNOSIS — M79645 Pain in left finger(s): Secondary | ICD-10-CM | POA: Diagnosis not present

## 2019-08-07 DIAGNOSIS — M25512 Pain in left shoulder: Secondary | ICD-10-CM | POA: Diagnosis not present

## 2019-08-07 DIAGNOSIS — M25511 Pain in right shoulder: Secondary | ICD-10-CM | POA: Diagnosis not present

## 2019-08-07 DIAGNOSIS — M65832 Other synovitis and tenosynovitis, left forearm: Secondary | ICD-10-CM | POA: Diagnosis not present

## 2019-08-07 DIAGNOSIS — M13841 Other specified arthritis, right hand: Secondary | ICD-10-CM | POA: Diagnosis not present

## 2019-08-07 DIAGNOSIS — M25532 Pain in left wrist: Secondary | ICD-10-CM | POA: Diagnosis not present

## 2019-08-13 DIAGNOSIS — M25511 Pain in right shoulder: Secondary | ICD-10-CM | POA: Diagnosis not present

## 2019-08-13 DIAGNOSIS — M25512 Pain in left shoulder: Secondary | ICD-10-CM | POA: Diagnosis not present

## 2019-09-06 DIAGNOSIS — M25511 Pain in right shoulder: Secondary | ICD-10-CM | POA: Diagnosis not present

## 2019-09-06 DIAGNOSIS — M25512 Pain in left shoulder: Secondary | ICD-10-CM | POA: Diagnosis not present

## 2019-09-20 DIAGNOSIS — M25511 Pain in right shoulder: Secondary | ICD-10-CM | POA: Diagnosis not present

## 2019-09-20 DIAGNOSIS — M25512 Pain in left shoulder: Secondary | ICD-10-CM | POA: Diagnosis not present

## 2019-09-24 DIAGNOSIS — M25512 Pain in left shoulder: Secondary | ICD-10-CM | POA: Diagnosis not present

## 2019-09-24 DIAGNOSIS — M25511 Pain in right shoulder: Secondary | ICD-10-CM | POA: Diagnosis not present

## 2019-09-27 DIAGNOSIS — M25511 Pain in right shoulder: Secondary | ICD-10-CM | POA: Diagnosis not present

## 2019-09-27 DIAGNOSIS — M25512 Pain in left shoulder: Secondary | ICD-10-CM | POA: Diagnosis not present

## 2019-10-01 DIAGNOSIS — M25512 Pain in left shoulder: Secondary | ICD-10-CM | POA: Diagnosis not present

## 2019-10-01 DIAGNOSIS — M25511 Pain in right shoulder: Secondary | ICD-10-CM | POA: Diagnosis not present

## 2019-10-04 DIAGNOSIS — M25512 Pain in left shoulder: Secondary | ICD-10-CM | POA: Diagnosis not present

## 2019-10-04 DIAGNOSIS — M25511 Pain in right shoulder: Secondary | ICD-10-CM | POA: Diagnosis not present

## 2019-10-07 DIAGNOSIS — M25511 Pain in right shoulder: Secondary | ICD-10-CM | POA: Diagnosis not present

## 2019-10-07 DIAGNOSIS — M25512 Pain in left shoulder: Secondary | ICD-10-CM | POA: Diagnosis not present

## 2019-10-10 DIAGNOSIS — M25511 Pain in right shoulder: Secondary | ICD-10-CM | POA: Diagnosis not present

## 2019-10-10 DIAGNOSIS — M25512 Pain in left shoulder: Secondary | ICD-10-CM | POA: Diagnosis not present

## 2019-10-24 DIAGNOSIS — M25511 Pain in right shoulder: Secondary | ICD-10-CM | POA: Diagnosis not present

## 2019-10-24 DIAGNOSIS — M25512 Pain in left shoulder: Secondary | ICD-10-CM | POA: Diagnosis not present

## 2019-10-30 DIAGNOSIS — H35033 Hypertensive retinopathy, bilateral: Secondary | ICD-10-CM | POA: Diagnosis not present

## 2019-10-30 DIAGNOSIS — Z961 Presence of intraocular lens: Secondary | ICD-10-CM | POA: Diagnosis not present

## 2019-10-30 DIAGNOSIS — H43813 Vitreous degeneration, bilateral: Secondary | ICD-10-CM | POA: Diagnosis not present

## 2019-10-30 DIAGNOSIS — H353132 Nonexudative age-related macular degeneration, bilateral, intermediate dry stage: Secondary | ICD-10-CM | POA: Diagnosis not present

## 2019-10-30 DIAGNOSIS — H31091 Other chorioretinal scars, right eye: Secondary | ICD-10-CM | POA: Diagnosis not present

## 2019-11-18 DIAGNOSIS — S79911A Unspecified injury of right hip, initial encounter: Secondary | ICD-10-CM | POA: Diagnosis not present

## 2019-11-18 DIAGNOSIS — S299XXA Unspecified injury of thorax, initial encounter: Secondary | ICD-10-CM | POA: Diagnosis not present

## 2019-11-18 DIAGNOSIS — R0781 Pleurodynia: Secondary | ICD-10-CM | POA: Diagnosis not present

## 2019-11-18 DIAGNOSIS — W19XXXA Unspecified fall, initial encounter: Secondary | ICD-10-CM | POA: Diagnosis not present

## 2019-11-18 DIAGNOSIS — M25551 Pain in right hip: Secondary | ICD-10-CM | POA: Diagnosis not present

## 2019-11-26 DIAGNOSIS — M25551 Pain in right hip: Secondary | ICD-10-CM | POA: Diagnosis not present

## 2019-11-26 DIAGNOSIS — R0781 Pleurodynia: Secondary | ICD-10-CM | POA: Diagnosis not present

## 2019-12-03 DIAGNOSIS — W19XXXA Unspecified fall, initial encounter: Secondary | ICD-10-CM | POA: Diagnosis not present

## 2019-12-03 DIAGNOSIS — M25551 Pain in right hip: Secondary | ICD-10-CM | POA: Diagnosis not present

## 2019-12-03 DIAGNOSIS — R0781 Pleurodynia: Secondary | ICD-10-CM | POA: Diagnosis not present

## 2019-12-05 DIAGNOSIS — R0781 Pleurodynia: Secondary | ICD-10-CM | POA: Diagnosis not present

## 2019-12-05 DIAGNOSIS — M25551 Pain in right hip: Secondary | ICD-10-CM | POA: Diagnosis not present

## 2019-12-11 DIAGNOSIS — R0781 Pleurodynia: Secondary | ICD-10-CM | POA: Diagnosis not present

## 2019-12-11 DIAGNOSIS — M25551 Pain in right hip: Secondary | ICD-10-CM | POA: Diagnosis not present

## 2019-12-13 DIAGNOSIS — M25551 Pain in right hip: Secondary | ICD-10-CM | POA: Diagnosis not present

## 2019-12-13 DIAGNOSIS — R0781 Pleurodynia: Secondary | ICD-10-CM | POA: Diagnosis not present

## 2019-12-16 DIAGNOSIS — M25551 Pain in right hip: Secondary | ICD-10-CM | POA: Diagnosis not present

## 2019-12-16 DIAGNOSIS — R0781 Pleurodynia: Secondary | ICD-10-CM | POA: Diagnosis not present

## 2020-01-01 DIAGNOSIS — M25551 Pain in right hip: Secondary | ICD-10-CM | POA: Diagnosis not present

## 2020-01-01 DIAGNOSIS — R0781 Pleurodynia: Secondary | ICD-10-CM | POA: Diagnosis not present

## 2020-01-03 DIAGNOSIS — M25551 Pain in right hip: Secondary | ICD-10-CM | POA: Diagnosis not present

## 2020-01-03 DIAGNOSIS — R0781 Pleurodynia: Secondary | ICD-10-CM | POA: Diagnosis not present

## 2020-01-08 DIAGNOSIS — M25551 Pain in right hip: Secondary | ICD-10-CM | POA: Diagnosis not present

## 2020-01-08 DIAGNOSIS — R0781 Pleurodynia: Secondary | ICD-10-CM | POA: Diagnosis not present

## 2020-01-10 DIAGNOSIS — R0781 Pleurodynia: Secondary | ICD-10-CM | POA: Diagnosis not present

## 2020-01-10 DIAGNOSIS — M25551 Pain in right hip: Secondary | ICD-10-CM | POA: Diagnosis not present

## 2020-01-16 DIAGNOSIS — R0781 Pleurodynia: Secondary | ICD-10-CM | POA: Diagnosis not present

## 2020-01-16 DIAGNOSIS — W19XXXA Unspecified fall, initial encounter: Secondary | ICD-10-CM | POA: Diagnosis not present

## 2020-01-16 DIAGNOSIS — M25551 Pain in right hip: Secondary | ICD-10-CM | POA: Diagnosis not present

## 2020-01-21 DIAGNOSIS — Z4789 Encounter for other orthopedic aftercare: Secondary | ICD-10-CM | POA: Diagnosis not present

## 2020-01-21 DIAGNOSIS — Z981 Arthrodesis status: Secondary | ICD-10-CM | POA: Diagnosis not present

## 2020-01-21 DIAGNOSIS — M4186 Other forms of scoliosis, lumbar region: Secondary | ICD-10-CM | POA: Diagnosis not present

## 2020-01-21 DIAGNOSIS — M5412 Radiculopathy, cervical region: Secondary | ICD-10-CM | POA: Diagnosis not present

## 2020-02-04 DIAGNOSIS — I10 Essential (primary) hypertension: Secondary | ICD-10-CM | POA: Diagnosis not present

## 2020-02-04 DIAGNOSIS — M19012 Primary osteoarthritis, left shoulder: Secondary | ICD-10-CM | POA: Diagnosis not present

## 2020-02-04 DIAGNOSIS — E782 Mixed hyperlipidemia: Secondary | ICD-10-CM | POA: Diagnosis not present

## 2020-02-04 DIAGNOSIS — N1831 Chronic kidney disease, stage 3a: Secondary | ICD-10-CM | POA: Diagnosis not present

## 2020-02-04 DIAGNOSIS — E049 Nontoxic goiter, unspecified: Secondary | ICD-10-CM | POA: Diagnosis not present

## 2020-05-14 DIAGNOSIS — Z03818 Encounter for observation for suspected exposure to other biological agents ruled out: Secondary | ICD-10-CM | POA: Diagnosis not present

## 2020-05-17 DIAGNOSIS — Z03818 Encounter for observation for suspected exposure to other biological agents ruled out: Secondary | ICD-10-CM | POA: Diagnosis not present

## 2020-05-19 ENCOUNTER — Ambulatory Visit (HOSPITAL_COMMUNITY)
Admission: RE | Admit: 2020-05-19 | Discharge: 2020-05-19 | Disposition: A | Discharge status: Home / Self Care | Payer: Medicare Other | Source: Ambulatory Visit | Attending: Pulmonary Disease | Admitting: Pulmonary Disease

## 2020-05-19 ENCOUNTER — Other Ambulatory Visit: Payer: Self-pay | Admitting: Hospice and Palliative Medicine

## 2020-05-19 DIAGNOSIS — U071 COVID-19: Secondary | ICD-10-CM | POA: Insufficient documentation

## 2020-05-19 DIAGNOSIS — Z23 Encounter for immunization: Secondary | ICD-10-CM | POA: Insufficient documentation

## 2020-05-19 MED ORDER — DIPHENHYDRAMINE HCL 50 MG/ML IJ SOLN: 50.0000 mg | Freq: Once | INTRAMUSCULAR | Status: DC | PRN

## 2020-05-19 MED ORDER — CLONIDINE HCL 0.1 MG PO TABS
0.1000 mg | ORAL_TABLET | Freq: Once | ORAL | Status: AC
  Administered 2020-05-19: 0.1 mg via ORAL
  Filled 2020-05-19: qty 1

## 2020-05-19 MED ORDER — METHYLPREDNISOLONE SODIUM SUCC 125 MG IJ SOLR: 125.0000 mg | Freq: Once | INTRAMUSCULAR | Status: DC | PRN

## 2020-05-19 MED ORDER — ALBUTEROL SULFATE HFA 108 (90 BASE) MCG/ACT IN AERS: 2.0000 | INHALATION_SPRAY | Freq: Once | RESPIRATORY_TRACT | Status: DC | PRN

## 2020-05-19 MED ORDER — FAMOTIDINE IN NACL 20-0.9 MG/50ML-% IV SOLN: 20.0000 mg | Freq: Once | INTRAVENOUS | Status: DC | PRN

## 2020-05-19 MED ORDER — EPINEPHRINE 0.3 MG/0.3ML IJ SOAJ: 0.3000 mg | Freq: Once | INTRAMUSCULAR | Status: DC | PRN

## 2020-05-19 MED ORDER — SODIUM CHLORIDE 0.9 % IV SOLN: INTRAVENOUS | Status: DC | PRN

## 2020-05-19 MED ORDER — SODIUM CHLORIDE 0.9 % IV SOLN
1200.0000 mg | Freq: Once | INTRAVENOUS | Status: AC
  Administered 2020-05-19: 1200 mg via INTRAVENOUS

## 2020-05-19 NOTE — Progress Notes (Signed)
I connected by phone with patient on 05/19/2020 to discuss the potential use of an new treatment for mild to moderate COVID-19 viral infection in non-hospitalized patients.   This patient is a age/sex that meets the FDA criteria for Emergency Use Authorization of casirivimab\imdevimab.  Has a (+) direct SARS-CoV-2 viral test result 1. Has mild or moderate COVID-19  2. Is ? 83 years of age and weighs ? 40 kg 3. Is NOT hospitalized due to COVID-19 4. Is NOT requiring oxygen therapy or requiring an increase in baseline oxygen flow rate due to COVID-19 5. Is within 10 days of symptom onset 6. Has at least one of the high risk factor(s) for progression to severe COVID-19 and/or hospitalization as defined in EUA. ? Specific high risk criteria : age   Symptom onset  9/19   I have spoken and communicated the following to the patient or parent/caregiver:   1. FDA has authorized the emergency use of casirivimab\imdevimab for the treatment of mild to moderate COVID-19 in adults and pediatric patients with positive results of direct SARS-CoV-2 viral testing who are 54 years of age and older weighing at least 40 kg, and who are at high risk for progressing to severe COVID-19 and/or hospitalization.   2. The significant known and potential risks and benefits of casirivimab\imdevimab, and the extent to which such potential risks and benefits are unknown.   3. Information on available alternative treatments and the risks and benefits of those alternatives, including clinical trials.   4. Patients treated with casirivimab\imdevimab should continue to self-isolate and use infection control measures (e.g., wear mask, isolate, social distance, avoid sharing personal items, clean and disinfect high touch surfaces, and frequent handwashing) according to CDC guidelines.    5. The patient or parent/caregiver has the option to accept or refuse casirivimab\imdevimab .   After reviewing this information with the  patient, The patient agreed to proceed with receiving casirivimab\imdevimab infusion and will be provided a copy of the Fact sheet prior to receiving the infusion.Altha Harm, PhD, NP-C [phone] ([place])

## 2020-05-19 NOTE — Discharge Instructions (Signed)

## 2020-05-19 NOTE — Progress Notes (Signed)
  Diagnosis: COVID-19  Physician: Dr. Joya Gaskins  Procedure: [facility] Med: casirivimab\imdevimab infusion - Provided patient with casirivimab\imdevimab fact sheet for patients, parents and caregivers prior to infusion.  Complications: Blood pressure was 209/83 following the infusion, the NP prescribed 0.1 mg of clonidine and that was given, blood pressure following the medication was 169/75. Discharge: Discharged home   Otho Ket 05/19/2020

## 2020-08-04 DIAGNOSIS — K219 Gastro-esophageal reflux disease without esophagitis: Secondary | ICD-10-CM | POA: Diagnosis not present

## 2020-08-04 DIAGNOSIS — E049 Nontoxic goiter, unspecified: Secondary | ICD-10-CM | POA: Diagnosis not present

## 2020-08-04 DIAGNOSIS — N390 Urinary tract infection, site not specified: Secondary | ICD-10-CM | POA: Diagnosis not present

## 2020-08-04 DIAGNOSIS — E782 Mixed hyperlipidemia: Secondary | ICD-10-CM | POA: Diagnosis not present

## 2020-08-04 DIAGNOSIS — I10 Essential (primary) hypertension: Secondary | ICD-10-CM | POA: Diagnosis not present

## 2020-08-04 DIAGNOSIS — N1831 Chronic kidney disease, stage 3a: Secondary | ICD-10-CM | POA: Diagnosis not present

## 2020-08-11 DIAGNOSIS — I10 Essential (primary) hypertension: Secondary | ICD-10-CM | POA: Diagnosis not present

## 2020-08-11 DIAGNOSIS — K219 Gastro-esophageal reflux disease without esophagitis: Secondary | ICD-10-CM | POA: Diagnosis not present

## 2020-08-11 DIAGNOSIS — E559 Vitamin D deficiency, unspecified: Secondary | ICD-10-CM | POA: Diagnosis not present

## 2020-08-11 DIAGNOSIS — N1831 Chronic kidney disease, stage 3a: Secondary | ICD-10-CM | POA: Diagnosis not present

## 2020-08-11 DIAGNOSIS — Z23 Encounter for immunization: Secondary | ICD-10-CM | POA: Diagnosis not present

## 2020-08-11 DIAGNOSIS — E782 Mixed hyperlipidemia: Secondary | ICD-10-CM | POA: Diagnosis not present

## 2020-08-11 DIAGNOSIS — E049 Nontoxic goiter, unspecified: Secondary | ICD-10-CM | POA: Diagnosis not present

## 2020-08-11 DIAGNOSIS — M19012 Primary osteoarthritis, left shoulder: Secondary | ICD-10-CM | POA: Diagnosis not present

## 2020-09-21 DIAGNOSIS — R350 Frequency of micturition: Secondary | ICD-10-CM | POA: Diagnosis not present

## 2020-09-21 DIAGNOSIS — N390 Urinary tract infection, site not specified: Secondary | ICD-10-CM | POA: Diagnosis not present

## 2020-09-23 DIAGNOSIS — Z03818 Encounter for observation for suspected exposure to other biological agents ruled out: Secondary | ICD-10-CM | POA: Diagnosis not present

## 2020-09-29 DIAGNOSIS — Z03818 Encounter for observation for suspected exposure to other biological agents ruled out: Secondary | ICD-10-CM | POA: Diagnosis not present

## 2021-02-16 DIAGNOSIS — M545 Low back pain, unspecified: Secondary | ICD-10-CM | POA: Diagnosis not present

## 2021-02-16 DIAGNOSIS — Z981 Arthrodesis status: Secondary | ICD-10-CM | POA: Diagnosis not present

## 2021-02-16 DIAGNOSIS — M4185 Other forms of scoliosis, thoracolumbar region: Secondary | ICD-10-CM | POA: Diagnosis not present

## 2021-02-16 DIAGNOSIS — M419 Scoliosis, unspecified: Secondary | ICD-10-CM | POA: Diagnosis not present

## 2021-02-16 DIAGNOSIS — E782 Mixed hyperlipidemia: Secondary | ICD-10-CM | POA: Diagnosis not present

## 2021-02-16 DIAGNOSIS — I10 Essential (primary) hypertension: Secondary | ICD-10-CM | POA: Diagnosis not present

## 2021-02-23 DIAGNOSIS — K219 Gastro-esophageal reflux disease without esophagitis: Secondary | ICD-10-CM | POA: Diagnosis not present

## 2021-02-23 DIAGNOSIS — E782 Mixed hyperlipidemia: Secondary | ICD-10-CM | POA: Diagnosis not present

## 2021-02-23 DIAGNOSIS — E559 Vitamin D deficiency, unspecified: Secondary | ICD-10-CM | POA: Diagnosis not present

## 2021-02-23 DIAGNOSIS — I10 Essential (primary) hypertension: Secondary | ICD-10-CM | POA: Diagnosis not present

## 2021-02-23 DIAGNOSIS — Z23 Encounter for immunization: Secondary | ICD-10-CM | POA: Diagnosis not present

## 2021-02-23 DIAGNOSIS — N1831 Chronic kidney disease, stage 3a: Secondary | ICD-10-CM | POA: Diagnosis not present

## 2021-02-23 DIAGNOSIS — E049 Nontoxic goiter, unspecified: Secondary | ICD-10-CM | POA: Diagnosis not present

## 2021-05-18 ENCOUNTER — Other Ambulatory Visit: Payer: Self-pay

## 2021-05-18 ENCOUNTER — Encounter (HOSPITAL_COMMUNITY): Payer: Self-pay | Admitting: Gastroenterology

## 2021-05-18 ENCOUNTER — Inpatient Hospital Stay (HOSPITAL_COMMUNITY)
Admission: AD | Admit: 2021-05-18 | Discharge: 2021-05-24 | DRG: 329 | Disposition: A | Discharge status: Home / Self Care | Payer: Medicare Other | Source: Ambulatory Visit | Attending: Surgery | Admitting: Surgery

## 2021-05-18 DIAGNOSIS — D62 Acute posthemorrhagic anemia: Secondary | ICD-10-CM | POA: Diagnosis present

## 2021-05-18 DIAGNOSIS — K922 Gastrointestinal hemorrhage, unspecified: Secondary | ICD-10-CM

## 2021-05-18 DIAGNOSIS — K6389 Other specified diseases of intestine: Secondary | ICD-10-CM

## 2021-05-18 DIAGNOSIS — C189 Malignant neoplasm of colon, unspecified: Principal | ICD-10-CM

## 2021-05-18 DIAGNOSIS — C182 Malignant neoplasm of ascending colon: Secondary | ICD-10-CM | POA: Diagnosis present

## 2021-05-18 DIAGNOSIS — I2510 Atherosclerotic heart disease of native coronary artery without angina pectoris: Secondary | ICD-10-CM | POA: Diagnosis not present

## 2021-05-18 DIAGNOSIS — K219 Gastro-esophageal reflux disease without esophagitis: Secondary | ICD-10-CM | POA: Diagnosis present

## 2021-05-18 DIAGNOSIS — J9859 Other diseases of mediastinum, not elsewhere classified: Secondary | ICD-10-CM | POA: Diagnosis not present

## 2021-05-18 DIAGNOSIS — Z1211 Encounter for screening for malignant neoplasm of colon: Secondary | ICD-10-CM | POA: Diagnosis not present

## 2021-05-18 DIAGNOSIS — Z96653 Presence of artificial knee joint, bilateral: Secondary | ICD-10-CM | POA: Diagnosis present

## 2021-05-18 DIAGNOSIS — K921 Melena: Secondary | ICD-10-CM | POA: Diagnosis not present

## 2021-05-18 DIAGNOSIS — Z79899 Other long term (current) drug therapy: Secondary | ICD-10-CM | POA: Diagnosis not present

## 2021-05-18 DIAGNOSIS — K5730 Diverticulosis of large intestine without perforation or abscess without bleeding: Secondary | ICD-10-CM | POA: Diagnosis not present

## 2021-05-18 DIAGNOSIS — Z885 Allergy status to narcotic agent status: Secondary | ICD-10-CM

## 2021-05-18 DIAGNOSIS — D490 Neoplasm of unspecified behavior of digestive system: Secondary | ICD-10-CM | POA: Diagnosis not present

## 2021-05-18 DIAGNOSIS — Z87891 Personal history of nicotine dependence: Secondary | ICD-10-CM | POA: Diagnosis not present

## 2021-05-18 DIAGNOSIS — R634 Abnormal weight loss: Secondary | ICD-10-CM | POA: Diagnosis not present

## 2021-05-18 DIAGNOSIS — R238 Other skin changes: Secondary | ICD-10-CM | POA: Diagnosis not present

## 2021-05-18 DIAGNOSIS — Z882 Allergy status to sulfonamides status: Secondary | ICD-10-CM

## 2021-05-18 DIAGNOSIS — K639 Disease of intestine, unspecified: Secondary | ICD-10-CM | POA: Diagnosis not present

## 2021-05-18 DIAGNOSIS — M160 Bilateral primary osteoarthritis of hip: Secondary | ICD-10-CM | POA: Diagnosis not present

## 2021-05-18 DIAGNOSIS — Z86010 Personal history of colonic polyps: Secondary | ICD-10-CM | POA: Diagnosis not present

## 2021-05-18 DIAGNOSIS — D6489 Other specified anemias: Secondary | ICD-10-CM | POA: Diagnosis not present

## 2021-05-18 DIAGNOSIS — M19011 Primary osteoarthritis, right shoulder: Secondary | ICD-10-CM | POA: Diagnosis not present

## 2021-05-18 DIAGNOSIS — M419 Scoliosis, unspecified: Secondary | ICD-10-CM | POA: Diagnosis not present

## 2021-05-18 DIAGNOSIS — N179 Acute kidney failure, unspecified: Secondary | ICD-10-CM | POA: Diagnosis present

## 2021-05-18 DIAGNOSIS — M19012 Primary osteoarthritis, left shoulder: Secondary | ICD-10-CM | POA: Diagnosis not present

## 2021-05-18 DIAGNOSIS — K5900 Constipation, unspecified: Secondary | ICD-10-CM | POA: Diagnosis not present

## 2021-05-18 DIAGNOSIS — D509 Iron deficiency anemia, unspecified: Secondary | ICD-10-CM | POA: Diagnosis not present

## 2021-05-18 DIAGNOSIS — E785 Hyperlipidemia, unspecified: Secondary | ICD-10-CM | POA: Diagnosis present

## 2021-05-18 DIAGNOSIS — Z20822 Contact with and (suspected) exposure to covid-19: Secondary | ICD-10-CM | POA: Diagnosis present

## 2021-05-18 DIAGNOSIS — Z8249 Family history of ischemic heart disease and other diseases of the circulatory system: Secondary | ICD-10-CM

## 2021-05-18 DIAGNOSIS — I10 Essential (primary) hypertension: Secondary | ICD-10-CM | POA: Diagnosis present

## 2021-05-18 DIAGNOSIS — K5731 Diverticulosis of large intestine without perforation or abscess with bleeding: Secondary | ICD-10-CM | POA: Diagnosis present

## 2021-05-18 DIAGNOSIS — Z833 Family history of diabetes mellitus: Secondary | ICD-10-CM

## 2021-05-18 DIAGNOSIS — Z23 Encounter for immunization: Secondary | ICD-10-CM | POA: Diagnosis not present

## 2021-05-18 HISTORY — DX: Gastro-esophageal reflux disease without esophagitis: K21.9

## 2021-05-18 HISTORY — DX: Unspecified osteoarthritis, unspecified site: M19.90

## 2021-05-18 HISTORY — DX: Diverticulosis of intestine, part unspecified, without perforation or abscess without bleeding: K57.90

## 2021-05-18 HISTORY — DX: Hyperlipidemia, unspecified: E78.5

## 2021-05-18 HISTORY — DX: Essential (primary) hypertension: I10

## 2021-05-18 LAB — COMPREHENSIVE METABOLIC PANEL
ALT: 14 U/L (ref 0–44)
AST: 20 U/L (ref 15–41)
Albumin: 4.4 g/dL (ref 3.5–5.0)
Alkaline Phosphatase: 57 U/L (ref 38–126)
Anion gap: 10 (ref 5–15)
BUN: 38 mg/dL — ABNORMAL HIGH (ref 8–23)
CO2: 21 mmol/L — ABNORMAL LOW (ref 22–32)
Calcium: 10 mg/dL (ref 8.9–10.3)
Chloride: 105 mmol/L (ref 98–111)
Creatinine, Ser: 1.09 mg/dL — ABNORMAL HIGH (ref 0.44–1.00)
GFR, Estimated: 50 mL/min — ABNORMAL LOW (ref >60)
Glucose, Bld: 104 mg/dL — ABNORMAL HIGH (ref 70–99)
Potassium: 4.1 mmol/L (ref 3.5–5.1)
Sodium: 136 mmol/L (ref 135–145)
Total Bilirubin: 0.9 mg/dL (ref 0.3–1.2)
Total Protein: 7 g/dL (ref 6.5–8.1)

## 2021-05-18 LAB — PROTIME-INR
INR: 1 (ref 0.8–1.2)
Prothrombin Time: 12.9 seconds (ref 11.4–15.2)

## 2021-05-18 LAB — CBC
HCT: 24.4 % — ABNORMAL LOW (ref 36.0–46.0)
HCT: 25.8 % — ABNORMAL LOW (ref 36.0–46.0)
Hemoglobin: 7.8 g/dL — ABNORMAL LOW (ref 12.0–15.0)
Hemoglobin: 8.2 g/dL — ABNORMAL LOW (ref 12.0–15.0)
MCH: 26.9 pg (ref 26.0–34.0)
MCH: 27.2 pg (ref 26.0–34.0)
MCHC: 31.8 g/dL (ref 30.0–36.0)
MCHC: 32 g/dL (ref 30.0–36.0)
MCV: 84.1 fL (ref 80.0–100.0)
MCV: 85.4 fL (ref 80.0–100.0)
Platelets: 368 10*3/uL (ref 150–400)
Platelets: 376 10*3/uL (ref 150–400)
RBC: 2.9 MIL/uL — ABNORMAL LOW (ref 3.87–5.11)
RBC: 3.02 MIL/uL — ABNORMAL LOW (ref 3.87–5.11)
RDW: 14.6 % (ref 11.5–15.5)
RDW: 14.8 % (ref 11.5–15.5)
WBC: 5.4 10*3/uL (ref 4.0–10.5)
WBC: 5.7 10*3/uL (ref 4.0–10.5)
nRBC: 0 % (ref 0.0–0.2)
nRBC: 0 % (ref 0.0–0.2)

## 2021-05-18 LAB — APTT: aPTT: 26 seconds (ref 24–36)

## 2021-05-18 MED ORDER — PANTOPRAZOLE SODIUM 40 MG IV SOLR
40.0000 mg | Freq: Two times a day (BID) | INTRAVENOUS | Status: DC
  Administered 2021-05-18 – 2021-05-19 (×2): 40 mg via INTRAVENOUS
  Filled 2021-05-18 (×2): qty 40

## 2021-05-18 MED ORDER — ACETAMINOPHEN 325 MG PO TABS
650.0000 mg | ORAL_TABLET | Freq: Four times a day (QID) | ORAL | Status: DC | PRN
  Administered 2021-05-19: 650 mg via ORAL
  Filled 2021-05-18: qty 2

## 2021-05-18 MED ORDER — SODIUM CHLORIDE 0.9 % IV SOLN: INTRAVENOUS | Status: DC

## 2021-05-18 MED ORDER — PEG 3350-KCL-NA BICARB-NACL 420 G PO SOLR
4000.0000 mL | Freq: Once | ORAL | Status: AC
  Administered 2021-05-18: 4000 mL via ORAL
  Filled 2021-05-18: qty 4000

## 2021-05-18 MED ORDER — HYDRALAZINE HCL 20 MG/ML IJ SOLN
5.0000 mg | Freq: Four times a day (QID) | INTRAMUSCULAR | Status: DC | PRN
  Administered 2021-05-18 – 2021-05-20 (×3): 5 mg via INTRAVENOUS
  Filled 2021-05-18 (×3): qty 1

## 2021-05-18 MED ORDER — ACETAMINOPHEN 650 MG RE SUPP: 650.0000 mg | Freq: Four times a day (QID) | RECTAL | Status: DC | PRN

## 2021-05-18 NOTE — Consult Note (Signed)
Reason for Consult: GI bleed  Referring Physician: [facility]  Marshelle Loomer HPI: This is an 86 year old female with a PMH of a sessile serrated adenoma and diverticula (colonoscopy 11/19/2019), HTN, GERD, and hyperlipidemia admitted for fatigue and melena/maroon stools.  This past Friday she stated to experience melenic stools, but over the weekend she reported having some clots.  She attends swimming classes three times per week and after her Monday session she felt much weaker than before.  This prompted her to call Dr. Ashby Dawes and she was evaluated in the office today.  Her HGB was drawn and it was at 7.7 g/dL.  A prior HGB value was in the 12 g/dL range from 2011.  The patient denies any chest pain, worsening SOB, abdominal pain, nausea, vomiting, hematemesis, or syncope.  The patient does take an Aleve intermittently and she thinks she took on dosing last week.  She mostly uses Tylenol for her arthritis complaints.  Past Medical History:  Diagnosis Date   Acid reflux    Diverticulosis    Essential hypertension    GERD (gastroesophageal reflux disease)    Hyperlipidemia    Osteoarthritis     Past Surgical History:  Procedure Laterality Date   ABDOMINOPLASTY     APPENDECTOMY     DECOMPRESSIVE LUMBAR LAMINECTOMY LEVEL 1     REPLACEMENT TOTAL KNEE BILATERAL     RHINOPLASTY     TONSILLECTOMY     TONSILLECTOMY AND ADENOIDECTOMY      Family History  Problem Relation Age of Onset   Heart disease Brother    Diabetes Brother     Social History:  reports that she has quit smoking. Her smoking use included cigarettes. She has never used smokeless tobacco. She reports current alcohol use. She reports that she does not use drugs.  Allergies:  Allergies  Allergen Reactions   Codeine    Sulfa Antibiotics     Medications: Scheduled:  pantoprazole (PROTONIX) IV  40 mg Intravenous Q12H   polyethylene glycol-electrolytes  4,000 mL Oral Once   Continuous:  sodium chloride       Results for orders placed or performed during the hospital encounter of 05/18/21 (from the past 24 hour(s))  Type and screen [place]     Status: None (Preliminary result)   Collection Time: 05/18/21  6:11 PM  Result Value Ref Range   ABO/RH(D) PENDING    Antibody Screen PENDING    Sample Expiration      05/21/2021,2359 Performed at [facility], [place] [address] [place], [place] [address]      No results found.  ROS:  As stated above in the HPI otherwise negative.  There were no vitals taken for this visit.    PE: Gen: NAD, Alert and Oriented HEENT:  [place]/AT, EOMI Neck: Supple, no LAD Lungs: CTA Bilaterally CV: RRR without M/G/R ABD: Soft, NTND, +BS Ext: No C/C/E  Assessment/Plan: 1) Melenic stool versus maroon stools with clots. 2) Anemia. 3) HTN.   The patient is hemodynamically stable.  Her history is suggestive of a diverticular bleed as and upper GI bleed with the passage of clots typically is associated with hemodynamic instability.  Further evaluation will be performed with an EGD/colonoscopy with Dr. Collene Mares tomorrow.  Plan: 1) EGD/colonoscopy tomorrow. 2) Follow HGB and transfuse as necessary.  Kyle Stansell D 05/18/2021, 6:41 PM

## 2021-05-18 NOTE — H&P (Signed)
History and Physical    Cindy Patterson TML:[id number] DOB: 05/01/1935 DOA: 05/18/2021  PCP: Merrilee Seashore, MD   I have briefly reviewed patients previous medical reports in [facility].  Patient coming from: Home  Chief Complaint: Passing dark stools  HPI: Cindy Patterson is a 86 year old female, widowed (spouse was a Psychiatric nurse), retired Scientist, research (life sciences), lives alone and dependent, medical history significant for essential hypertension, hyperlipidemia, GERD, diverticulosis by colonoscopy approximately 10 years ago, osteoarthritis, multiple surgical procedures as noted below, presented to the [facility] as a direct admission from Dr. Collene Mares, GI office with above complaints.  Patient reports that she was in her usual state of health until 05/14/2021 when she had an episode of dark/maroon stools with clots associated with abdominal cramping.  She denies ever having epigastric pain, nausea or vomiting.  She had multiple BMs on 05/15/2021 with low volume dark stools and no clots.  Ongoing abdominal cramping prior to BMs.  She denied any blood in stools on 05/16/2021.  After returning from swimming exercises on 05/17/2021, she had another episode of more formed but dark stools following which she went to see her PCP today, lab works revealed hemoglobin of 7.7.  PCP referred her to see her gastroenterologist and patient was directed to the hospital for admission and evaluation.  She denied prior history of GI bleeds.  Uses Advil only occasionally and denies being on blood thinners.  She reports generalized weakness/tiredness but denies chest pain, dyspnea, palpitations, passing out or feeling like passing out.  ED Course: Patient presented to the hospital as a direct admission and did not come through the ED.  Review of Systems:  All other systems reviewed and apart from HPI, are negative.  She does report chronic dyspnea on significant exertion which has not changed.  Past Medical  History:  Diagnosis Date   Acid reflux    Diverticulosis    Essential hypertension    GERD (gastroesophageal reflux disease)    Hyperlipidemia    Osteoarthritis     Past Surgical History:  Procedure Laterality Date   ABDOMINOPLASTY     APPENDECTOMY     DECOMPRESSIVE LUMBAR LAMINECTOMY LEVEL 1     REPLACEMENT TOTAL KNEE BILATERAL     RHINOPLASTY     TONSILLECTOMY     TONSILLECTOMY AND ADENOIDECTOMY      Social History  reports that she has quit smoking. Her smoking use included cigarettes. She has never used smokeless tobacco. She reports current alcohol use. She reports that she does not use drugs.  She reports that she smoked for around 5 years and quit more than 45 years ago.  She drinks alcohol only occasionally.  Allergies  Allergen Reactions   Codeine    Sulfa Antibiotics     Family History  Problem Relation Age of Onset   Heart disease Brother    Diabetes Brother      Prior to Admission medications   Not on File  I have requested pharmacy to reconcile her home medications.  Patient reported that she takes Prilosec, antihypertensives and lipid-lowering medications  Physical Exam: Vitals:   05/18/21 1742  BP: (!) 169/95  Pulse: 76  Resp: 18  Temp: 98.4 F (36.9 C)  TempSrc: Oral  SpO2: 100%      Constitutional: Pleasant elderly female, moderately built and nourished, sitting up comfortably in bed without distress. Eyes: PERTLA, lids and conjunctivae normal ENMT: Mucous membranes are moist and pale. Posterior pharynx clear of any exudate or  lesions. Normal dentition.  Neck: supple, no masses, no thyromegaly.  No lymphadenopathy appreciated. Respiratory: Clear to auscultation without wheezing, rhonchi or crackles. No increased work of breathing. Cardiovascular: S1 & S2 heard, regular rate and rhythm. No JVD, murmurs, rubs or clicks. No pedal edema. Abdomen: Non distended. Non tender. Soft. No organomegaly or masses appreciated. No clinical Ascites.  Normal bowel sounds heard. Musculoskeletal: no clubbing / cyanosis. No joint deformity upper and lower extremities. Good ROM, no contractures. Normal muscle tone.  Skin: no rashes, lesions, ulcers. No induration Neurologic: CN 2-12 grossly intact. Sensation intact, DTR normal. Strength 5/5 in all 4 limbs.  Psychiatric: Normal judgment and insight. Alert and oriented x 3. Normal mood.     Labs on Admission: I have requested following labs (CBC, CMP, PT, PTT, type and screen)-all were drawn by labs when I was in the room and results are pending.  In current chart, no prior labs noted.  Patient appears to have a second chart and the last lab from 2015 had a hemoglobin of 13.5.  CBC: Recent Labs  Lab 05/18/21 1811  WBC 5.7  HGB 8.2*  HCT 25.8*  MCV 85.4  PLT 812    Basic Metabolic Panel: No results for input(s): NA, K, CL, CO2, GLUCOSE, BUN, CREATININE, CALCIUM, MG, PHOS in the last 168 hours.  Liver Function Tests: No results for input(s): AST, ALT, ALKPHOS, BILITOT, PROT, ALBUMIN in the last 168 hours.  Urine analysis: No results found for: COLORURINE, APPEARANCEUR, LABSPEC, PHURINE, GLUCOSEU, HGBUR, BILIRUBINUR, KETONESUR, PROTEINUR, UROBILINOGEN, NITRITE, LEUKOCYTESUR   Radiological Exams on Admission: No results found.   Assessment/Plan Principal Problem:   Acute GI bleeding Active Problems:   Acute blood loss anemia     Acute GI bleeding, could be upper versus lower: She reports dark maroon-colored stools with clots which may be acute upper GI bleed versus right-sided diverticular bleed given history of diverticulosis.  Clear liquid diet, n.p.o. after midnight, IV fluids, 2 large-bore peripheral IV access, twice daily IV PPI and GI has been consulted.  I discussed with both Dr. Collene Mares and Dr. Benson Norway in detail.  May be for both EGD and colonoscopy tomorrow.    Acute blood loss anemia: Secondary to acute GI bleed.  Baseline hemoglobin not known but may be in the normal range.   Follow CBC closely and transfuse if hemoglobin 7 g or less.  Patient has been typed and screened.  Essential hypertension: For now hold maintenance antihypertensives and only use as needed IV hydralazine.  Hyperlipidemia: Awaiting pharmacy to reconcile home meds and can restart home meds.  GERD: IV PPI at this time.  History of diverticulosis: As discussed with Dr. Collene Mares, this was seen on last colonoscopy maybe 10 years ago.   DVT prophylaxis: SCD's Code Status: Full confirmed with patient Family Communication: I discussed in detail with patient's daughter via phone and granddaughter at bedside.  Updated care and answered all questions. Disposition Plan:   Patient is from:  Home  Anticipated DC to:  Home  Anticipated DC date:  05/20/2021  Anticipated DC barriers: None   Consults called: Gastroenterology (Dr. Benson Norway) Admission status: Inpatient, medical bed  Severity of Illness: The appropriate patient status for this patient is INPATIENT. Inpatient status is judged to be reasonable and necessary in order to provide the required intensity of service to ensure the patient's safety. The patient's presenting symptoms, physical exam findings, and initial radiographic and laboratory data in the context of their chronic comorbidities is felt to place  them at high risk for further clinical deterioration. Furthermore, it is not anticipated that the patient will be medically stable for discharge from the hospital within 2 midnights of admission. The following factors support the patient status of inpatient.   " The patient's presenting symptoms include multiple episodes of passing dark/maroon-colored stools, generalized weakness. " The worrisome physical exam findings include pallor. " The initial radiographic and laboratory data are worrisome because of hemoglobin of 8.2. " The chronic co-morbidities include HTN, HLD, diverticulosis.   * I certify that at the point of admission it is my clinical  judgment that the patient will require inpatient hospital care spanning beyond 2 midnights from the point of admission due to high intensity of service, high risk for further deterioration and high frequency of surveillance required.Vernell Leep MD [facility]  To contact the attending provider between 7A-7P or the covering provider during after hours 7P-7A, please log into the web site [email or web address] and access using universal [place] password for that web site. If you do not have the password, please call the hospital operator.  05/18/2021, 6:52 PM

## 2021-05-19 ENCOUNTER — Encounter (HOSPITAL_COMMUNITY): Admission: AD | Disposition: A | Discharge status: Home / Self Care | Payer: Self-pay | Source: Ambulatory Visit

## 2021-05-19 ENCOUNTER — Other Ambulatory Visit: Payer: Self-pay | Admitting: Gastroenterology

## 2021-05-19 ENCOUNTER — Inpatient Hospital Stay (HOSPITAL_COMMUNITY): Payer: Medicare Other | Admitting: Anesthesiology

## 2021-05-19 ENCOUNTER — Inpatient Hospital Stay (HOSPITAL_COMMUNITY): Payer: Medicare Other

## 2021-05-19 ENCOUNTER — Encounter (HOSPITAL_COMMUNITY): Payer: Self-pay | Admitting: Internal Medicine

## 2021-05-19 DIAGNOSIS — D62 Acute posthemorrhagic anemia: Secondary | ICD-10-CM | POA: Diagnosis not present

## 2021-05-19 DIAGNOSIS — K922 Gastrointestinal hemorrhage, unspecified: Secondary | ICD-10-CM | POA: Diagnosis not present

## 2021-05-19 HISTORY — PX: ESOPHAGOGASTRODUODENOSCOPY (EGD) WITH PROPOFOL: SHX5813

## 2021-05-19 HISTORY — PX: COLONOSCOPY WITH PROPOFOL: SHX5780

## 2021-05-19 HISTORY — PX: BIOPSY: SHX5522

## 2021-05-19 LAB — CBC
HCT: 24.2 % — ABNORMAL LOW (ref 36.0–46.0)
HCT: 25.1 % — ABNORMAL LOW (ref 36.0–46.0)
Hemoglobin: 7.6 g/dL — ABNORMAL LOW (ref 12.0–15.0)
Hemoglobin: 7.9 g/dL — ABNORMAL LOW (ref 12.0–15.0)
MCH: 27 pg (ref 26.0–34.0)
MCH: 27 pg (ref 26.0–34.0)
MCHC: 31.4 g/dL (ref 30.0–36.0)
MCHC: 31.5 g/dL (ref 30.0–36.0)
MCV: 85.8 fL (ref 80.0–100.0)
MCV: 85.7 fL (ref 80.0–100.0)
Platelets: 352 10*3/uL (ref 150–400)
Platelets: 338 10*3/uL (ref 150–400)
RBC: 2.82 MIL/uL — ABNORMAL LOW (ref 3.87–5.11)
RBC: 2.93 MIL/uL — ABNORMAL LOW (ref 3.87–5.11)
RDW: 14.5 % (ref 11.5–15.5)
RDW: 14.6 % (ref 11.5–15.5)
WBC: 5.8 10*3/uL (ref 4.0–10.5)
WBC: 4.4 10*3/uL (ref 4.0–10.5)
nRBC: 0 % (ref 0.0–0.2)
nRBC: 0 % (ref 0.0–0.2)

## 2021-05-19 LAB — BASIC METABOLIC PANEL
Anion gap: 7 (ref 5–15)
BUN: 29 mg/dL — ABNORMAL HIGH (ref 8–23)
CO2: 22 mmol/L (ref 22–32)
Calcium: 9.7 mg/dL (ref 8.9–10.3)
Chloride: 109 mmol/L (ref 98–111)
Creatinine, Ser: 0.86 mg/dL (ref 0.44–1.00)
GFR, Estimated: >60 mL/min (ref >60)
Glucose, Bld: 96 mg/dL (ref 70–99)
Potassium: 3.9 mmol/L (ref 3.5–5.1)
Sodium: 138 mmol/L (ref 135–145)

## 2021-05-19 LAB — SARS CORONAVIRUS 2 (TAT 6-24 HRS): SARS Coronavirus 2: NEGATIVE

## 2021-05-19 SURGERY — ESOPHAGOGASTRODUODENOSCOPY (EGD) WITH PROPOFOL
Anesthesia: Monitor Anesthesia Care

## 2021-05-19 MED ORDER — LISINOPRIL 20 MG PO TABS
80.0000 mg | ORAL_TABLET | Freq: Every day | ORAL | Status: DC
  Administered 2021-05-19 – 2021-05-24 (×6): 80 mg via ORAL
  Filled 2021-05-19 (×6): qty 4

## 2021-05-19 MED ORDER — ATORVASTATIN CALCIUM 10 MG PO TABS
10.0000 mg | ORAL_TABLET | Freq: Every day | ORAL | Status: DC
  Administered 2021-05-19 – 2021-05-23 (×5): 10 mg via ORAL
  Filled 2021-05-19 (×6): qty 1

## 2021-05-19 MED ORDER — SODIUM CHLORIDE 0.9 % IV SOLN: INTRAVENOUS | Status: DC

## 2021-05-19 MED ORDER — IOHEXOL 350 MG/ML SOLN
75.0000 mL | Freq: Once | INTRAVENOUS | Status: AC | PRN
  Administered 2021-05-19: 75 mL via INTRAVENOUS

## 2021-05-19 MED ORDER — TIZANIDINE HCL 4 MG PO TABS: 2.0000 mg | ORAL_TABLET | Freq: Three times a day (TID) | ORAL | Status: DC | PRN

## 2021-05-19 MED ORDER — PANTOPRAZOLE SODIUM 40 MG PO TBEC
40.0000 mg | DELAYED_RELEASE_TABLET | Freq: Every day | ORAL | Status: DC
  Administered 2021-05-20 – 2021-05-24 (×5): 40 mg via ORAL
  Filled 2021-05-19 (×5): qty 1

## 2021-05-19 MED ORDER — LIDOCAINE 2% (20 MG/ML) 5 ML SYRINGE
INTRAMUSCULAR | Status: DC | PRN
  Administered 2021-05-19: 100 mg via INTRAVENOUS

## 2021-05-19 MED ORDER — LABETALOL HCL 5 MG/ML IV SOLN
5.0000 mg | Freq: Once | INTRAVENOUS | Status: AC
  Administered 2021-05-19: 5 mg via INTRAVENOUS

## 2021-05-19 MED ORDER — SALINE SPRAY 0.65 % NA SOLN
1.0000 | NASAL | Status: DC | PRN
  Filled 2021-05-19: qty 44

## 2021-05-19 MED ORDER — PHENYLEPHRINE HCL (PRESSORS) 10 MG/ML IV SOLN
INTRAVENOUS | Status: DC | PRN
  Administered 2021-05-19: 80 ug via INTRAVENOUS

## 2021-05-19 MED ORDER — LIP MEDEX EX OINT
1.0000 "application " | TOPICAL_OINTMENT | CUTANEOUS | Status: DC | PRN
  Filled 2021-05-19: qty 7

## 2021-05-19 MED ORDER — LACTATED RINGERS IV SOLN: INTRAVENOUS | Status: DC

## 2021-05-19 MED ORDER — LABETALOL HCL 5 MG/ML IV SOLN
INTRAVENOUS | Status: AC
  Filled 2021-05-19: qty 4

## 2021-05-19 MED ORDER — TRAMADOL HCL 50 MG PO TABS
50.0000 mg | ORAL_TABLET | Freq: Four times a day (QID) | ORAL | Status: DC | PRN
  Administered 2021-05-20: 50 mg via ORAL
  Filled 2021-05-19: qty 1

## 2021-05-19 MED ORDER — VITAMIN B-6 100 MG PO TABS
100.0000 mg | ORAL_TABLET | Freq: Every day | ORAL | Status: DC
  Administered 2021-05-19 – 2021-05-24 (×5): 100 mg via ORAL
  Filled 2021-05-19 (×6): qty 1

## 2021-05-19 MED ORDER — FUROSEMIDE 20 MG PO TABS
20.0000 mg | ORAL_TABLET | Freq: Every day | ORAL | Status: DC
  Administered 2021-05-20 – 2021-05-21 (×2): 20 mg via ORAL
  Filled 2021-05-19 (×2): qty 1

## 2021-05-19 MED ORDER — PROPOFOL 500 MG/50ML IV EMUL
INTRAVENOUS | Status: DC | PRN
  Administered 2021-05-19: 150 ug/kg/min via INTRAVENOUS

## 2021-05-19 MED ORDER — PANTOPRAZOLE SODIUM 40 MG PO TBEC: 40.0000 mg | DELAYED_RELEASE_TABLET | Freq: Every day | ORAL | Status: DC

## 2021-05-19 MED ORDER — SODIUM CHLORIDE 0.9 % IV SOLN: INTRAVENOUS | Status: AC

## 2021-05-19 MED ORDER — PROPOFOL 10 MG/ML IV BOLUS
INTRAVENOUS | Status: DC | PRN
  Administered 2021-05-19 (×2): 50 mg via INTRAVENOUS

## 2021-05-19 SURGICAL SUPPLY — 24 items

## 2021-05-19 NOTE — Progress Notes (Signed)
    OVERNIGHT PROGRESS REPORT  Follow up lab results for CBC indicate level above noted level to transfuse  BMP pending  Gershon Cull MSNA MSN [place]

## 2021-05-19 NOTE — Interval H&P Note (Signed)
History and Physical Interval Note:  05/19/2021 3:13 PM  Cindy Patterson  has presented today for surgery, with the diagnosis of GI bleed.  The various methods of treatment have been discussed with the patient and family. After consideration of risks, benefits and other options for treatment, the patient has consented to  Procedure(s): ESOPHAGOGASTRODUODENOSCOPY (EGD) WITH PROPOFOL (N/A) COLONOSCOPY WITH PROPOFOL (N/A) as a surgical intervention.  The patient's history has been reviewed, patient examined, no change in status, stable for surgery.  I have reviewed the patient's chart and labs.  Questions were answered to the patient's satisfaction.     [name]

## 2021-05-19 NOTE — Progress Notes (Signed)
    OVERNIGHT PROGRESS REPORT  On rounds to the patients room. RN describes no obvious or stated complaints from patient and she is able to rest.  Lab results (CBC) have returned and require no further at this point. Will follow the next set and address as needed.  Gershon Cull MSNA MSN [facility] Acute Care Nurse Practitioner [place]

## 2021-05-19 NOTE — Op Note (Signed)
[facility] Patient Name: Cindy Patterson Procedure Date: 05/19/2021 MRN: [id number] Attending MD: [name] , MD Date of Birth: November 29, 1934 CSN: [id number] Age: 86 Admit Type: Inpatient Procedure:                Colonoscopy with biopsies. Indications:              Melena, Iron deficiency anemia, Abnormal weight                            loss. Providers:                [name], MD, Grace Isaac, RN, Tyrone Apple, Technician, Luan Moore, Technician,                            Aline Brochure, CRNA Referring MD:             Merrilee Seashore, MD Medicines:                Monitored Anesthesia Care Complications:            No immediate complications. Estimated Blood Loss:     Estimated blood loss was minimal. Procedure:                Pre-Anesthesia Assessment: - Prior to the                            procedure, a history and physical was performed,                            and patient medications and allergies were                            reviewed. The patient's tolerance of previous                            anesthesia was also reviewed. The risks and                            benefits of the procedure and the sedation options                            and risks were discussed with the patient. All                            questions were answered, and informed consent was                            obtained. Prior Anticoagulants: The patient has                            taken no previous anticoagulant or antiplatelet                            agents. ASA  Grade Assessment: II - A patient with                            mild systemic disease. After reviewing the risks                            and benefits, the patient was deemed in                            satisfactory condition to undergo the procedure.                            After obtaining informed consent, the colonoscope                            was  passed under direct vision. Throughout the                            procedure, the patient's blood pressure, pulse, and                            oxygen saturations were monitored continuously. The                            CF-HQ190L ([id number]) Olympus colonoscope was                            introduced through the anus and advanced to the the                            proximal ascending colon. The colonoscopy was                            performed with moderate difficulty due to                            inadequate bowel prep. Successful completion of the                            procedure was aided by lavage. The patient                            tolerated the procedure well. The quality of the                            bowel preparation was adequate. Scope In: 3:26:35 PM Scope Out: 3:41:48 PM Total Procedure Duration: 0 hours 15 minutes 13 seconds  Findings:      A few small and large-mouthed diverticula were found in the entire colon.      An ulcerated, partially obstructing, circunferential mass was found in       the proximal ascending colon-this was biopsied; I was unable to pass the       scope through the mas sinto the cecum..      No additional abnormalities  were found on retroflexion. Impression:               - Scattered diverticulosis in the entire examined                            colon.                           - Likely malignant partially obstructing tumor in                            the proximal ascending colon-biopsied. Moderate Sedation:      MAC used. Recommendation:           - Clear liquid diet daily.                           - Continue present medications.                           - Perform a CT scan of chest with contrast, abdomen                            with contrast and pelvis with contrast at                            appointment to be scheduled.                           - Refer to a surgeon today.                           - Await  pathology results.                           - Repeat colonoscopy in 1 year for surveillance. Procedure Code(s):        --- Professional ---                           [id number], Colonoscopy, flexible; with biopsy, single                            or multiple Diagnosis Code(s):        --- Professional ---                           K92.1, Melena (includes Hematochezia)                           D50.9, Iron deficiency anemia, unspecified                           R63.4, Abnormal weight loss                           Z12.11, Encounter for screening for malignant  neoplasm of colon                           K57.30, Diverticulosis of large intestine without                            perforation or abscess without bleeding                           D49.0, Neoplasm of unspecified behavior of                            digestive system CPT copyright 2019 American Medical Association. All rights reserved. The codes documented in this report are preliminary and upon coder review may  be revised to meet current compliance requirements. [name], MD [name], MD 05/19/2021 4:11:27 PM This report has been signed electronically. Number of Addenda: 0

## 2021-05-19 NOTE — Transfer of Care (Signed)
Immediate Anesthesia Transfer of Care Note  Patient: Cindy Patterson  Procedure(s) Performed: ESOPHAGOGASTRODUODENOSCOPY (EGD) WITH PROPOFOL COLONOSCOPY WITH PROPOFOL BIOPSY  Patient Location: Endoscopy Unit  Anesthesia Type:General  Level of Consciousness: awake, alert  and oriented  Airway & Oxygen Therapy: Patient Spontanous Breathing  Post-op Assessment: Report given to RN and Post -op Vital signs reviewed and stable  Post vital signs: Reviewed and stable  Last Vitals:  Vitals Value Taken Time  BP 134/47 05/19/21 1548  Temp 36.6 C 05/19/21 1443  Pulse 72 05/19/21 1550  Resp 17 05/19/21 1550  SpO2 100 % 05/19/21 1550  Vitals shown include unvalidated device data.  Last Pain:  Vitals:   05/19/21 1548  TempSrc: Oral  PainSc: 0-No pain         Complications: No notable events documented.

## 2021-05-19 NOTE — H&P (View-Only) (Signed)
   05/19/21 1430  Assess: MEWS Score  Temp 98 F (36.7 C)  BP (!) 208/84 (RN notified)  Pulse Rate 91  Resp 20  Level of Consciousness Alert  SpO2 100 %  O2 Device Room Air  Assess: MEWS Score  MEWS Temp 0  MEWS Systolic 2  MEWS Pulse 0  MEWS RR 0  MEWS LOC 0  MEWS Score 2  MEWS Score Color Yellow  Assess: if the MEWS score is Yellow or Red  Were vital signs taken at a resting state? Yes  Focused Assessment No change from prior assessment  Early Detection of Sepsis Score *See Row Information* Low  MEWS guidelines implemented *See Row Information* Yes  Treat  MEWS Interventions Escalated (See documentation below);Other (Comment) (see progress note, MD aware, procedural MD to advise. )  Pain Scale 0-10  Pain Score 0  Take Vital Signs  Increase Vital Sign Frequency  Yellow: Q 2hr X 2 then Q 4hr X 2, if remains yellow, continue Q 4hrs  Escalate  MEWS: Escalate Yellow: discuss with charge nurse/RN and consider discussing with provider and RRT  Notify: Charge Nurse/RN  Name of Charge Nurse/RN Notified Keiva RN   Date Charge Nurse/RN Notified 05/19/21  Time Charge Nurse/RN Notified 1450  Notify: Provider  Provider Name/Title Dr. Algis Liming  Date Provider Notified 05/19/21  Time Provider Notified 1445  Notification Type Page  Notification Reason Change in status  Provider response See new orders  Date of Provider Response 05/19/21  Time of Provider Response 1445  Document  Patient Outcome Other (Comment) (GI doctor advising prior to procedure)  Progress note created (see row info) Yes  Assess: SIRS CRITERIA  SIRS Temperature  0  SIRS Pulse 1  SIRS Respirations  0  SIRS WBC 0  SIRS Score Sum  1  MEWS Guidelines - (patients age 70 and over)   Pt's BP elevated, triggering yellow MEWS protocol. Pt being picked up for ENDO procedure at time of BP reading. Advised by ENDO team, Dr. Collene Mares to assess and treat downstairs prior to procedure. Dr. Algis Liming made aware.

## 2021-05-19 NOTE — Anesthesia Postprocedure Evaluation (Signed)
Anesthesia Post Note  Patient: Cindy Patterson  Procedure(s) Performed: ESOPHAGOGASTRODUODENOSCOPY (EGD) WITH PROPOFOL COLONOSCOPY WITH PROPOFOL BIOPSY     Patient location during evaluation: PACU Anesthesia Type: MAC Level of consciousness: awake and alert and oriented Pain management: pain level controlled Vital Signs Assessment: post-procedure vital signs reviewed and stable Respiratory status: spontaneous breathing, nonlabored ventilation and respiratory function stable Cardiovascular status: stable and blood pressure returned to baseline Postop Assessment: no apparent nausea or vomiting Anesthetic complications: no   No notable events documented.  Last Vitals:  Vitals:   05/19/21 1550 05/19/21 1600  BP: 139/62 (!) 160/63  Pulse: 72 62  Resp: 17 14  Temp:    SpO2: 100% 95%    Last Pain:  Vitals:   05/19/21 1548  TempSrc: Oral  PainSc: 0-No pain                 [name] A.

## 2021-05-19 NOTE — Anesthesia Preprocedure Evaluation (Addendum)
Anesthesia Evaluation  Patient identified by MRN, date of birth, ID band Patient awake    Reviewed: Allergy & Precautions, NPO status , Patient's Chart, lab work & pertinent test results, reviewed documented beta blocker date and time   Airway Mallampati: II  TM Distance: >3 FB Neck ROM: Limited   Comment: Hx/o awake fiberoptic intubation for cervical fusion Dental no notable dental hx. (+) Caps, Dental Advisory Given, Teeth Intact   Pulmonary former smoker,    Pulmonary exam normal breath sounds clear to auscultation       Cardiovascular hypertension, Pt. on medications Normal cardiovascular exam Rhythm:Regular Rate:Normal     Neuro/Psych negative neurological ROS  negative psych ROS   GI/Hepatic Neg liver ROS, GERD  Medicated,Acute GI Bleed Hx/o diverticulosis   Endo/Other  Hyperlipidemia  Renal/GU negative Renal ROS  negative genitourinary   Musculoskeletal  (+) Arthritis , Osteoarthritis,  S/P cervical fusion   Abdominal   Peds  Hematology  (+) anemia ,   Anesthesia Other Findings   Reproductive/Obstetrics                            Anesthesia Physical Anesthesia Plan  ASA: 2  Anesthesia Plan: MAC   Post-op Pain Management:    Induction:   PONV Risk Score and Plan: 2 and Treatment may vary due to age or medical condition and Propofol infusion  Airway Management Planned: Natural Airway and Simple Face Mask  Additional Equipment:   Intra-op Plan:   Post-operative Plan:   Informed Consent: I have reviewed the patients History and Physical, chart, labs and discussed the procedure including the risks, benefits and alternatives for the proposed anesthesia with the patient or authorized representative who has indicated his/her understanding and acceptance.     Dental advisory given  Plan Discussed with: CRNA and Anesthesiologist  Anesthesia Plan Comments: (Potential  difficult intubation. Will give Labetalol for accelerated HTN.)      Anesthesia Quick Evaluation

## 2021-05-19 NOTE — Progress Notes (Signed)
Addendum  S/p EGD and colonoscopy.  Dr. Collene Mares called me with preliminary report.  Reviewed formal report just now.  EGD negative. Colonoscopy: shows a likely malignant partially obstructing tumor in the proximal ascending colon-biopsied and pathology pending.  As per GI recommendations: Discontinued IV PPI, continue prior home dose of oral PPI. General surgery consulted and they will see tomorrow. CT with contrast of chest abdomen and pelvis-has been ordered by surgery already. CEA requested.  Ongoing uncontrolled hypertension Resumed home dose of lisinopril 80 mg daily.  Lasix to begin tomorrow.  Acute blood loss anemia Hemoglobin 7.9 this evening, stable.  Follow CBC in AM.  Vernell Leep, MD, FACP, [facility]. [facility]  To contact the attending provider between 7A-7P or the covering provider during after hours 7P-7A, please log into the web site [email or web address] and access using universal [place] password for that web site. If you do not have the password, please call the hospital operator.

## 2021-05-19 NOTE — Op Note (Signed)
[facility] Patient Name: Cindy Patterson Procedure Date: 05/19/2021 MRN: [id number] Attending MD: [name] , MD Date of Birth: 1934-11-09 CSN: [id number] Age: 86 Admit Type: Inpatient Procedure:                Diagnostic EGD. Indications:              Melena, Iron deficiency anemia, Abnormal weight                            loss. Providers:                [name], MD, Grace Isaac, RN, Tyrone Apple, Technician, Luan Moore, Technician, Referring MD:             Merrilee Seashore, MD Medicines:                Monitored Anesthesia Care. Complications:            No immediate complications. Estimated Blood Loss:     Estimated blood loss: none. Procedure:                Pre-Anesthesia Assessment: - Prior to the                            procedure, a history and physical was performed,                            and patient medications and allergies were                            reviewed. The patient's tolerance of previous                            anesthesia was also reviewed. The risks and                            benefits of the procedure and the sedation options                            and risks were discussed with the patient. All                            questions were answered, and informed consent was                            obtained. Prior Anticoagulants: The patient has                            taken no previous anticoagulant or antiplatelet                            agents. ASA Grade Assessment: II - A patient with  mild systemic disease. After reviewing the risks                            and benefits, the patient was deemed in                            satisfactory condition to undergo the procedure.                            After obtaining informed consent, the endoscope was                            passed under direct vision. Throughout the                             procedure, the patient's blood pressure, pulse, and                            oxygen saturations were monitored continuously. The                            [id number]-[id number] ([id number]) Olympus endoscope was introduced                            through the mouth, and advanced to the second part                            of duodenum. The EGD was accomplished without                            difficulty. The patient tolerated the procedure                            well. Scope In: Scope Out: Findings:      The examined esophagus and GEJ appeared widely patent and normal.      The entire examined stomach was normal.      The cardia and gastric fundus were normal on retroflexion.      The examined duodenum was normal. Impression:               - Normal appearing, widely patent esophagus and GEJ.                           - Normal appearing stomach.                           - Normal examined duodenum.                           - No specimens collected. Moderate Sedation:      MAC used. Recommendation:           - Clear liquid diet today.                           - Continue present medications except for the PPI's.                           -  Proceed with the colonoscopy. Procedure Code(s):        --- Professional ---                           [phone], Esophagogastroduodenoscopy, flexible,                            transoral; diagnostic, including collection of                            specimen(s) by brushing or washing, when performed                            (separate procedure) Diagnosis Code(s):        --- Professional ---                           K92.1, Melena (includes Hematochezia)                           D50.9, Iron deficiency anemia, unspecified                           R63.4, Abnormal weight loss CPT copyright 2019 American Medical Association. All rights reserved. The codes documented in this report are preliminary and upon coder review may  be revised to meet current  compliance requirements. [name], MD [name], MD 05/19/2021 4:00:22 PM This report has been signed electronically. Number of Addenda: 0

## 2021-05-19 NOTE — Progress Notes (Signed)
PROGRESS NOTE   Cindy Patterson  FVC:[id number]    DOB: 1935-03-24    DOA: 05/18/2021  PCP: Merrilee Seashore, MD   I have briefly reviewed patients previous medical records in [facility].  Chief complaint: Dark/maroon-colored stools with clots and generalized weakness.   Brief Narrative:  86 year old female, widowed (spouse was a Psychiatric nurse), retired Scientist, research (life sciences), lives alone and dependent, medical history significant for essential hypertension, hyperlipidemia, GERD, diverticulosis by colonoscopy approximately 10 years ago, osteoarthritis, multiple surgical procedures as noted below, presented to the [facility] as a direct admission from Dr. Collene Mares, GI office with complaints of passing dark/maroon-colored stools with clots x3 to 4 days PTA, generalized weakness and hemoglobin in office of 7.7 g per DL.  Admitted for acute GI bleed, upper versus lower, acute blood loss anemia.  GI consulted and plan EGD/colonoscopy 9/21.   Assessment & Plan:  Principal Problem:   Acute GI bleeding Active Problems:   Acute blood loss anemia   Acute GI bleeding, could be upper versus lower: She reports dark maroon-colored stools with clots which may be acute upper GI bleed (also BUN disproportionately higher than creatinine) versus right-sided diverticular bleed given history of diverticulosis.  Clear liquid diet, n.p.o. after midnight, IV fluids, 2 large-bore peripheral IV access, twice daily IV PPI and GI has been consulted.  Underwent bowel prep overnight with returns now clear and nonbloody per RN.  GI plans EGD and colonoscopy today and await further recommendations.  Acute blood loss anemia: Secondary to acute GI bleed.  Baseline hemoglobin not known but may be in the normal range.  Follow CBC closely and transfuse if hemoglobin 7 g or less.  Patient has been typed and screened.  Mild drop in hemoglobin compared to outpatient check, probably hemodilution.  Will recheck CBC  later this evening as well.   Essential hypertension: For now hold maintenance antihypertensives and only use as needed IV hydralazine.  Consider resuming prior home meds postprocedure.  Hyperlipidemia: Resume home dose of statins postprocedure.  GERD: IV PPI at this time.  History of diverticulosis: As discussed with Dr. Collene Mares, this was seen on last colonoscopy maybe 10 years ago.    Body mass index is 27.83 kg/m.    DVT prophylaxis: SCDs Start: 05/18/21 1830     Code Status: Full Code Family Communication: None at bedside this morning. Disposition:  Status is: Inpatient  Remains inpatient appropriate because:Inpatient level of care appropriate due to severity of illness  Dispo: The patient is from: Home              Anticipated d/c is to: Home              Patient currently is not medically stable to d/c.   Difficult to place patient No        Consultants:   GI/Dr. Benson Norway  Procedures:   None  Antimicrobials:    Anti-infectives (From admission, onward)    None         Subjective:  Seen this morning prior to procedures.  Tolerated bowel prep overnight.  Did not see color of her stools overnight but per nursing, returns this morning for clear/yellow without blood or dark.  Patient denies dizziness, lightheadedness, chest pain, dyspnea.  Objective:   Vitals:   05/18/21 2159 05/19/21 0012 05/19/21 0203 05/19/21 0634  BP: (!) 197/67 (!) 158/79 (!) 161/68 (!) 167/78  Pulse: 83 98 75 81  Resp: 18  18 18   Temp: 98.7 F (  37.1 C)  98 F (36.7 C) 97.7 F (36.5 C)  TempSrc: Oral  Oral Oral  SpO2: 100%  100% 100%  Weight:      Height:        General exam: Pleasant elderly female, moderately built and nourished lying comfortably supine in bed.  Oral mucosa with pallor but moist. Respiratory system: Clear to auscultation. Respiratory effort normal. Cardiovascular system: S1 & S2 heard, RRR. No JVD, murmurs, rubs, gallops or clicks. No pedal  edema. Gastrointestinal system: Abdomen is nondistended, soft and nontender. No organomegaly or masses felt. Normal bowel sounds heard. Central nervous system: Alert and oriented. No focal neurological deficits. Extremities: Symmetric 5 x 5 power. Skin: No rashes, lesions or ulcers Psychiatry: Judgement and insight appear normal. Mood & affect appropriate.     Data Reviewed:   I have personally reviewed following labs and imaging studies   CBC: Recent Labs  Lab 05/18/21 1811 05/18/21 2334 05/19/21 0542  WBC 5.7 5.4 5.8  HGB 8.2* 7.8* 7.6*  HCT 25.8* 24.4* 24.2*  MCV 85.4 84.1 85.8  PLT 376 368 700    Basic Metabolic Panel: Recent Labs  Lab 05/18/21 1811 05/19/21 0542  NA 136 138  K 4.1 3.9  CL 105 109  CO2 21* 22  GLUCOSE 104* 96  BUN 38* 29*  CREATININE 1.09* 0.86  CALCIUM 10.0 9.7    Liver Function Tests: Recent Labs  Lab 05/18/21 1811  AST 20  ALT 14  ALKPHOS 57  BILITOT 0.9  PROT 7.0  ALBUMIN 4.4    CBG: No results for input(s): GLUCAP in the last 168 hours.  Microbiology Studies:   Recent Results (from the past 240 hour(s))  SARS CORONAVIRUS 2 (TAT 6-24 HRS) Nasopharyngeal Nasopharyngeal Swab     Status: None   Collection Time: 05/18/21  6:45 PM   Specimen: Nasopharyngeal Swab  Result Value Ref Range Status   SARS Coronavirus 2 NEGATIVE NEGATIVE Final    Comment: (NOTE) SARS-CoV-2 target nucleic acids are NOT DETECTED.  The SARS-CoV-2 RNA is generally detectable in upper and lower respiratory specimens during the acute phase of infection. Negative results do not preclude SARS-CoV-2 infection, do not rule out co-infections with other pathogens, and should not be used as the sole basis for treatment or other patient management decisions. Negative results must be combined with clinical observations, patient history, and epidemiological information. The expected result is Negative.  Fact Sheet for  Patients: SugarRoll.be  Fact Sheet for Healthcare Providers: https://www.woods-mathews.com/  This test is not yet approved or cleared by the [place] FDA and  has been authorized for detection and/or diagnosis of SARS-CoV-2 by FDA under an Emergency Use Authorization (EUA). This EUA will remain  in effect (meaning this test can be used) for the duration of the COVID-19 declaration under Se ction 564(b)(1) of the Act, 21 U.S.C. section 360bbb-3(b)(1), unless the authorization is terminated or revoked sooner.  Performed at [facility], [place] [address] [place], [place] [address]      Radiology Studies:  No results found.   Scheduled Meds:    pantoprazole (PROTONIX) IV  40 mg Intravenous Q12H    Continuous Infusions:    sodium chloride 50 mL/hr at 05/19/21 1021     LOS: 1 day     Vernell Leep, MD, [place], [facility]. [facility]    To contact the attending provider between 7A-7P or the covering provider during after hours 7P-7A, please log into the web site [email or web address] and access  using universal [place] password for that web site. If you do not have the password, please call the hospital operator.  05/19/2021, 12:35 PM

## 2021-05-19 NOTE — Progress Notes (Signed)
@  1620 pt returned to [place] room 1616. No c/o pain. Hydralazine administered for elevated BP. Will recheck.

## 2021-05-19 NOTE — Progress Notes (Signed)
   05/19/21 1430  Assess: MEWS Score  Temp 98 F (36.7 C)  BP (!) 208/84 (RN notified)  Pulse Rate 91  Resp 20  Level of Consciousness Alert  SpO2 100 %  O2 Device Room Air  Assess: MEWS Score  MEWS Temp 0  MEWS Systolic 2  MEWS Pulse 0  MEWS RR 0  MEWS LOC 0  MEWS Score 2  MEWS Score Color Yellow  Assess: if the MEWS score is Yellow or Red  Were vital signs taken at a resting state? Yes  Focused Assessment No change from prior assessment  Early Detection of Sepsis Score *See Row Information* Low  MEWS guidelines implemented *See Row Information* Yes  Treat  MEWS Interventions Escalated (See documentation below);Other (Comment) (see progress note, MD aware, procedural MD to advise. )  Pain Scale 0-10  Pain Score 0  Take Vital Signs  Increase Vital Sign Frequency  Yellow: Q 2hr X 2 then Q 4hr X 2, if remains yellow, continue Q 4hrs  Escalate  MEWS: Escalate Yellow: discuss with charge nurse/RN and consider discussing with provider and RRT  Notify: Charge Nurse/RN  Name of Charge Nurse/RN Notified [place] RN   Date Charge Nurse/RN Notified 05/19/21  Time Charge Nurse/RN Notified 1450  Notify: Provider  Provider Name/Title Dr. Algis Liming  Date Provider Notified 05/19/21  Time Provider Notified 1445  Notification Type Page  Notification Reason Change in status  Provider response See new orders  Date of Provider Response 05/19/21  Time of Provider Response 1445  Document  Patient Outcome Other (Comment) (GI doctor advising prior to procedure)  Progress note created (see row info) Yes  Assess: SIRS CRITERIA  SIRS Temperature  0  SIRS Pulse 1  SIRS Respirations  0  SIRS WBC 0  SIRS Score Sum  1  MEWS Guidelines - (patients age 19 and over)   Pt's BP elevated, triggering yellow MEWS protocol. Pt being picked up for ENDO procedure at time of BP reading. Advised by ENDO team, Dr. Collene Mares to assess and treat downstairs prior to procedure. Dr. Algis Liming made aware.

## 2021-05-19 NOTE — Progress Notes (Signed)
BP elevated prior to being picked up for procedure. Advised not to give prn medication on floor and procedural MD would address and treat downstairs. MD made aware.

## 2021-05-20 ENCOUNTER — Encounter (HOSPITAL_COMMUNITY): Payer: Self-pay | Admitting: Internal Medicine

## 2021-05-20 ENCOUNTER — Other Ambulatory Visit: Payer: Self-pay

## 2021-05-20 DIAGNOSIS — K6389 Other specified diseases of intestine: Secondary | ICD-10-CM

## 2021-05-20 LAB — CBC
HCT: 23 % — ABNORMAL LOW (ref 36.0–46.0)
Hemoglobin: 7.4 g/dL — ABNORMAL LOW (ref 12.0–15.0)
MCH: 27.2 pg (ref 26.0–34.0)
MCHC: 32.2 g/dL (ref 30.0–36.0)
MCV: 84.6 fL (ref 80.0–100.0)
Platelets: 332 10*3/uL (ref 150–400)
RBC: 2.72 MIL/uL — ABNORMAL LOW (ref 3.87–5.11)
RDW: 14.9 % (ref 11.5–15.5)
WBC: 4.7 10*3/uL (ref 4.0–10.5)
nRBC: 0 % (ref 0.0–0.2)

## 2021-05-20 LAB — BASIC METABOLIC PANEL
Anion gap: 5 (ref 5–15)
BUN: 12 mg/dL (ref 8–23)
CO2: 22 mmol/L (ref 22–32)
Calcium: 10.1 mg/dL (ref 8.9–10.3)
Chloride: 111 mmol/L (ref 98–111)
Creatinine, Ser: 0.65 mg/dL (ref 0.44–1.00)
GFR, Estimated: >60 mL/min (ref >60)
Glucose, Bld: 97 mg/dL (ref 70–99)
Potassium: 4 mmol/L (ref 3.5–5.1)
Sodium: 138 mmol/L (ref 135–145)

## 2021-05-20 LAB — CEA: CEA: 2.6 ng/mL (ref 0.0–4.7)

## 2021-05-20 MED ORDER — CHLORHEXIDINE GLUCONATE 4 % EX LIQD
1.0000 "application " | Freq: Once | CUTANEOUS | Status: AC
  Administered 2021-05-21: 1 via TOPICAL
  Filled 2021-05-20: qty 15

## 2021-05-20 MED ORDER — SODIUM CHLORIDE 0.9 % IV SOLN: 2.0000 g | INTRAVENOUS | Status: DC

## 2021-05-20 MED ORDER — ACETAMINOPHEN 500 MG PO TABS
1000.0000 mg | ORAL_TABLET | ORAL | Status: AC
  Administered 2021-05-21: 1000 mg via ORAL
  Filled 2021-05-20: qty 2

## 2021-05-20 MED ORDER — ENSURE PRE-SURGERY PO LIQD: 592.0000 mL | Freq: Once | ORAL | Status: DC

## 2021-05-20 MED ORDER — ENSURE PRE-SURGERY PO LIQD
296.0000 mL | Freq: Once | ORAL | Status: AC
  Administered 2021-05-21: 296 mL via ORAL
  Filled 2021-05-20: qty 296

## 2021-05-20 MED ORDER — ALVIMOPAN 12 MG PO CAPS
12.0000 mg | ORAL_CAPSULE | ORAL | Status: AC
  Administered 2021-05-21: 12 mg via ORAL
  Filled 2021-05-20: qty 1

## 2021-05-20 MED ORDER — SODIUM CHLORIDE 0.9 % IV SOLN
2.0000 g | INTRAVENOUS | Status: AC
  Administered 2021-05-21: 2 g via INTRAVENOUS
  Filled 2021-05-20: qty 2

## 2021-05-20 MED ORDER — BUPIVACAINE LIPOSOME 1.3 % IJ SUSP: 20.0000 mL | INTRAMUSCULAR | Status: AC

## 2021-05-20 NOTE — Plan of Care (Signed)

## 2021-05-20 NOTE — Progress Notes (Addendum)
PROGRESS NOTE   Cindy Patterson  DQQ:[id number]    DOB: Jul 20, 1935    DOA: 05/18/2021  PCP: Merrilee Seashore, MD   I have briefly reviewed patients previous medical records in [facility].  Chief complaint: Dark/maroon-colored stools with clots and generalized weakness.   Brief Narrative:  86 year old female, widowed (spouse was a Psychiatric nurse), retired Scientist, research (life sciences), lives alone and dependent, medical history significant for essential hypertension, hyperlipidemia, GERD, diverticulosis by colonoscopy approximately 10 years ago, osteoarthritis, multiple surgical procedures as noted below, presented to the [facility] as a direct admission from Dr. Collene Mares, GI office with complaints of passing dark/maroon-colored stools with clots x3 to 4 days PTA, generalized weakness and hemoglobin in office of 7.7 g per DL.  Admitted for acute GI bleed, upper versus lower, acute blood loss anemia.  GI consulted.  EGD unremarkable.  Colonoscopy confirmed likely malignant partially obstructing tumor in the proximal ascending colon, biopsied.  CT chest abdomen and pelvis showed no evidence of local or distant spread.  General surgery consulted and plan laparoscopic hemicolectomy 9/23.   Assessment & Plan:  Principal Problem:   Acute GI bleeding Active Problems:   Acute blood loss anemia   Acute lower GI bleeding due to malignant appearing ascending colon mass: GI consulted.  EGD unremarkable.  Colonoscopy confirmed likely malignant partially obstructing tumor in the proximal ascending colon, biopsied.  CT chest abdomen and pelvis showed no evidence of local or distant spread.  CEA 2.6.  Pathology confirms adenocarcinoma-details as below.  General surgery consulted and plan laparoscopic assisted right hemicolectomy 9/23.  Acute blood loss anemia: Secondary to acute GI bleed.  Baseline hemoglobin not known but may be in the normal range.  Follow CBC closely and transfuse if hemoglobin 7 g  or less.  Hemoglobin stable in the mid to low 7 g range.  Follow CBC in a.m.  Defer preop blood transfusion to general surgery.  Acute kidney injury: Present on admission.  Resolved.   Essential hypertension: Resumed lisinopril 80 mg daily.  As needed IV hydralazine.  Labile BP.  Monitor closely.  Hyperlipidemia: Resumed statins.  GERD: EGD unremarkable.  Currently on home dose of PPI but could consider stopping.  History of diverticulosis: Seen again on colonoscopy 9/21.   Stable complex left thyroid lobe mass: Seen on CT 9/21.  Outpatient follow-up  Body mass index is 27.83 kg/m.    DVT prophylaxis: SCD's Start: 05/20/21 1941 SCDs Start: 05/18/21 1830     Code Status: Full Code Family Communication: None at bedside this morning.  I spoke to the second daughter via phone, updated care and answered questions. Disposition:  Status is: Inpatient  Remains inpatient appropriate because:Inpatient level of care appropriate due to severity of illness  Dispo: The patient is from: Home              Anticipated d/c is to: Home              Patient currently is not medically stable to d/c.   Difficult to place patient No    Pathology:  FINAL MICROSCOPIC DIAGNOSIS:   A. COLON, PROXIMAL RIGHT, ASCENDING, BIOPSY:  - Adenocarcinoma.   COMMENT:   MMR will be ordered. Dr. Collene Mares was notified on 05/20/21.   Consultants:   GI/Dr. Benson Norway General surgery.  Procedures:   None  Antimicrobials:    Anti-infectives (From admission, onward)    Start     Dose/Rate Route Frequency Ordered Stop   05/21/21 0600  cefoTEtan (  CEFOTAN) 2 g in sodium chloride 0.9 % 100 mL IVPB  Status:  Discontinued        2 g 200 mL/hr over 30 Minutes Intravenous On call to O.R. 05/20/21 8333 05/20/21 0906   05/21/21 0600  cefoTEtan (CEFOTAN) 2 g in sodium chloride 0.9 % 100 mL IVPB        2 g 200 mL/hr over 30 Minutes Intravenous On call to O.R. 05/20/21 0907 05/22/21 0559         Subjective:  Patient  seen this morning.  Denies complaints.  No chest pain, dyspnea, dizziness or lightheadedness.  General surgery is already discussed with her insurance aware of surgical procedure tomorrow.  Objective:   Vitals:   05/20/21 0400 05/20/21 0548 05/20/21 0626 05/20/21 1339  BP: (!) 175/65 (!) 168/103 (!) 140/54 (!) 176/88  Pulse: 81 (!) 111 64 81  Resp:  (!) 24  16  Temp: 97.8 F (36.6 C) 98.7 F (37.1 C)  98.3 F (36.8 C)  TempSrc: Oral Oral  Oral  SpO2: 96% 98%  98%  Weight:      Height:        General exam: Pleasant elderly female, moderately built and nourished sitting up comfortably in chair.  Oral mucosa moist with pallor. Respiratory system: Clear to auscultation.  No increased work of breathing. Cardiovascular system: S1 and S2 heard, RRR.  No JVD, murmurs or pedal edema. Gastrointestinal system: Abdomen is nondistended, soft and nontender. No organomegaly or masses felt. Normal bowel sounds heard. Central nervous system: Alert and oriented. No focal neurological deficits. Extremities: Symmetric 5 x 5 power. Skin: No rashes, lesions or ulcers Psychiatry: Judgement and insight appear normal. Mood & affect appropriate.     Data Reviewed:   I have personally reviewed following labs and imaging studies   CBC: Recent Labs  Lab 05/19/21 0542 05/19/21 1648 05/20/21 0540  WBC 5.8 4.4 4.7  HGB 7.6* 7.9* 7.4*  HCT 24.2* 25.1* 23.0*  MCV 85.8 85.7 84.6  PLT 352 338 832    Basic Metabolic Panel: Recent Labs  Lab 05/18/21 1811 05/19/21 0542 05/20/21 0540  NA 136 138 138  K 4.1 3.9 4.0  CL 105 109 111  CO2 21* 22 22  GLUCOSE 104* 96 97  BUN 38* 29* 12  CREATININE 1.09* 0.86 0.65  CALCIUM 10.0 9.7 10.1    Liver Function Tests: Recent Labs  Lab 05/18/21 1811  AST 20  ALT 14  ALKPHOS 57  BILITOT 0.9  PROT 7.0  ALBUMIN 4.4    CBG: No results for input(s): GLUCAP in the last 168 hours.  Microbiology Studies:   Recent Results (from the past 240 hour(s))   SARS CORONAVIRUS 2 (TAT 6-24 HRS) Nasopharyngeal Nasopharyngeal Swab     Status: None   Collection Time: 05/18/21  6:45 PM   Specimen: Nasopharyngeal Swab  Result Value Ref Range Status   SARS Coronavirus 2 NEGATIVE NEGATIVE Final    Comment: (NOTE) SARS-CoV-2 target nucleic acids are NOT DETECTED.  The SARS-CoV-2 RNA is generally detectable in upper and lower respiratory specimens during the acute phase of infection. Negative results do not preclude SARS-CoV-2 infection, do not rule out co-infections with other pathogens, and should not be used as the sole basis for treatment or other patient management decisions. Negative results must be combined with clinical observations, patient history, and epidemiological information. The expected result is Negative.  Fact Sheet for Patients: SugarRoll.be  Fact Sheet for Healthcare Providers: https://www.woods-mathews.com/  This test  is not yet approved or cleared by the [place] and  has been authorized for detection and/or diagnosis of SARS-CoV-2 by FDA under an Emergency Use Authorization (EUA). This EUA will remain  in effect (meaning this test can be used) for the duration of the COVID-19 declaration under Se ction 564(b)(1) of the Act, 21 U.S.C. section 360bbb-3(b)(1), unless the authorization is terminated or revoked sooner.  Performed at [facility], [place] [address] [place], [place] [address]      Radiology Studies:  CT CHEST ABDOMEN PELVIS W CONTRAST  Result Date: 05/19/2021 CLINICAL DATA:  Colorectal cancer staging. EXAM: CT CHEST, ABDOMEN, AND PELVIS WITH CONTRAST TECHNIQUE: Multidetector CT imaging of the chest, abdomen and pelvis was performed following the standard protocol during bolus administration of intravenous contrast. CONTRAST:  67m OMNIPAQUE IOHEXOL 350 MG/ML SOLN COMPARISON:  02/24/2014 FINDINGS: CT CHEST FINDINGS Cardiovascular: The heart is normal in  size. No pericardial effusion. The aorta demonstrates moderate tortuosity, mild ectasia and moderate atherosclerotic calcifications. Dense three-vessel coronary artery calcifications are noted. Mediastinum/Nodes: No mediastinal or hilar mass or adenopathy. The esophagus is grossly normal. There is a complex 4.5 x 4.2 cm mass associated with the left thyroid lobe. This has been evaluated on previous imaging. (ref: J Am Coll Radiol. 2015 Feb;12(2): 143-50). Lungs/Pleura: No acute pulmonary findings. No worrisome pulmonary lesions. No pulmonary nodules to suggest pulmonary metastatic disease. Musculoskeletal: Severe degenerative changes involving both shoulders with joint effusions and large bursal fluid collections. No acute bony findings or destructive bony changes. Cervical spine fusion hardware is noted. Advanced degenerative changes involving the thoracolumbar spine and significant scoliosis. CT ABDOMEN PELVIS FINDINGS Hepatobiliary: No hepatic lesions to suggest metastatic disease. No intrahepatic biliary dilatation. The gallbladder is grossly normal. No common bile duct dilatation. Pancreas: No mass, inflammation or ductal dilatation. Spleen: Normal size.  No focal lesions. Adrenals/Urinary Tract: Adrenal glands and kidneys are grossly normal. No worrisome renal lesions or hydronephrosis. The bladder is unremarkable. Stomach/Bowel: The stomach, duodenum and small bowel are unremarkable. There is an apple-core type neoplasm involving the ascending colon. No findings to suggest tumor invasion of the pericolonic fat. No adjacent ileocolic adenopathy. The remainder of the colon is grossly normal. Vascular/Lymphatic: Advanced atherosclerotic calcifications involving the aorta and iliac arteries and branch vessels but no aneurysm. Major venous structures are patent. No mesenteric or retroperitoneal mass or adenopathy. Reproductive: Surgically absent. Other: There is a 3.5 cm cystic structure in the subcutaneous fat  overlying the left abdominal oblique muscles. This is likely a benign subcutaneous cyst. No significant change since the prior study 2015. Musculoskeletal: No significant bony findings. Stable degenerative changes involving the spine hips. No worrisome bone lesions. IMPRESSION: 1. 2.5 cm apple-core type ascending colon lesion consistent with known colon cancer. No findings for local tumor invasion of the pericolonic fat or adjacent ileocolic adenopathy. 2. No findings for metastatic disease involving the chest, abdomen or pelvis. 3. Advanced atherosclerotic calcifications involving the thoracic and abdominal aorta and branch vessels including the coronary arteries. 4. Severe degenerative changes involving both shoulders with joint effusions and large bursal fluid collections. 5. Stable complex left thyroid lobe mass, previously addressed. 6. Aortic atherosclerosis. Aortic Atherosclerosis (ICD10-I70.0). Electronically Signed   By: PMarijo SanesM.D.   On: 05/19/2021 21:41     Scheduled Meds:    [START ON 05/21/2021] acetaminophen  1,000 mg Oral On Call to OR   [START ON 05/21/2021] alvimopan  12 mg Oral On Call to OR   atorvastatin  10 mg Oral QHS   [START ON 05/21/2021] bupivacaine liposome  20 mL Infiltration On Call to OR   chlorhexidine  1 application Topical Once   [START ON 05/21/2021] feeding supplement  296 mL Oral Once   feeding supplement  592 mL Oral Once   furosemide  20 mg Oral Daily   lisinopril  80 mg Oral Daily   pantoprazole  40 mg Oral Daily   pyridOXINE  100 mg Oral Daily    Continuous Infusions:    [START ON 05/21/2021] cefoTEtan (CEFOTAN) IV       LOS: 2 days     Vernell Leep, MD, [place], [facility]. [facility]    To contact the attending provider between 7A-7P or the covering provider during after hours 7P-7A, please log into the web site [email or web address] and access using universal [place] password for that web site. If you do not have the password, please call the  hospital operator.  05/20/2021, 3:13 PM

## 2021-05-20 NOTE — Anesthesia Preprocedure Evaluation (Addendum)
Anesthesia Evaluation  Patient identified by MRN, date of birth, ID band Patient awake    Reviewed: Allergy & Precautions, NPO status , Patient's Chart, lab work & pertinent test results  Airway Mallampati: II  TM Distance: >3 FB     Dental   Pulmonary former smoker,    breath sounds clear to auscultation       Cardiovascular hypertension,  Rhythm:Regular Rate:Normal     Neuro/Psych    GI/Hepatic Neg liver ROS, GERD  ,  Endo/Other  negative endocrine ROS  Renal/GU negative Renal ROS     Musculoskeletal  (+) Arthritis ,   Abdominal   Peds  Hematology  (+) anemia ,   Anesthesia Other Findings   Reproductive/Obstetrics                            Anesthesia Physical Anesthesia Plan  ASA: 3  Anesthesia Plan: General   Post-op Pain Management:    Induction: Intravenous  PONV Risk Score and Plan: 3 and Ondansetron, Dexamethasone, Midazolam and Treatment may vary due to age or medical condition  Airway Management Planned: Oral ETT  Additional Equipment:   Intra-op Plan:   Post-operative Plan:   Informed Consent: I have reviewed the patients History and Physical, chart, labs and discussed the procedure including the risks, benefits and alternatives for the proposed anesthesia with the patient or authorized representative who has indicated his/her understanding and acceptance.     Dental advisory given  Plan Discussed with: Anesthesiologist, CRNA and Surgeon  Anesthesia Plan Comments:        Anesthesia Quick Evaluation

## 2021-05-20 NOTE — Consult Note (Signed)
Cindy Patterson 06-19-1935  [id number].    Requesting MD: Dr. 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  On Friday she noted some maroon colored stools mixed with melena as well as some clots.  This continued into Saturday as well.  Some crampiness was noted, but no other abdominal pain.  She denies nausea or vomiting.  Sunday was improved and she went to water aerobics on Monday.  When she got home she was very tired and had another bloody BM.  She saw her PCP Dr. Rockne Menghini on Tuesday who referred her to Dr. Collene Mares.  Her hgb was noted to be 7.7 at that time and she was referred to the hospital.  She underwent a colonoscopy yesterday and was found to have a partially obstructing right colon mass, likely malignant.  She last had a colonoscopy 10 years ago which by report was normal.  She denies CP, SOB, dizziness, light-headedness, dysuria, etc, except significant arthritis of her extremities.  Given her c-scope findings and bleeding, we have been asked to see for surgical evaluation.  ROS: ROS: Please see HPI, otherwise all other systems reviewed and are negative except as outlined above.  Family History  Problem Relation Age of Onset   Heart disease Brother    Diabetes Brother     Past Medical History:  Diagnosis Date   Acid reflux    Diverticulosis    Essential hypertension    GERD (gastroesophageal reflux disease)    Hyperlipidemia    Osteoarthritis     Past Surgical History:  Procedure Laterality Date   ABDOMINOPLASTY     APPENDECTOMY     DECOMPRESSIVE LUMBAR LAMINECTOMY LEVEL 1     REPLACEMENT TOTAL KNEE BILATERAL     RHINOPLASTY     TONSILLECTOMY     TONSILLECTOMY AND ADENOIDECTOMY      Social History:  reports that she has quit smoking. Her smoking use included cigarettes. She has never used smokeless tobacco. She  reports current alcohol use. She reports that she does not use drugs.  Allergies:  Allergies  Allergen Reactions   Codeine    Sulfa Antibiotics     Medications Prior to Admission  Medication Sig Dispense Refill   acetaminophen (TYLENOL) 500 MG tablet Take 1,000 mg by mouth every 6 (six) hours as needed for mild pain or moderate pain.     Ascorbic Acid (VITAMIN C) 1000 MG tablet Take 1 capsule by mouth daily.     atorvastatin (LIPITOR) 10 MG tablet Take 10 mg by mouth at bedtime.     Cholecalciferol (VITAMIN D3) 10 MCG (400 UNIT) CAPS Take 10 mcg by mouth at bedtime.     furosemide (LASIX) 20 MG tablet Take 20 mg by mouth daily. Take with banana or OJ     lisinopril (ZESTRIL) 40 MG tablet Take 80 mg by mouth daily.     omeprazole (PRILOSEC) 20 MG capsule Take 20 mg by mouth daily.     pyridOXINE (VITAMIN B-6) 100 MG tablet Take 100 mg by mouth daily.     tiZANidine (ZANAFLEX) 2 MG tablet Take 2 mg by mouth 3 (three) times daily as needed.     traMADol (ULTRAM) 50 MG tablet Take 50 mg by mouth every 6 (six) hours as needed for pain.       Physical Exam:  Blood pressure (!) 140/54, pulse 64, temperature 98.7 F (37.1 C), temperature source Oral, resp. rate (!) 24, height 5' 1.5" (1.562 m), weight 67.9 kg, SpO2 98 %. General: pleasant, WD, WN white female who is laying in bed in NAD HEENT: head is normocephalic, atraumatic.  Sclera are noninjected.  PERRL.  Ears and nose without any masses or lesions.  Mouth is pink and moist Heart: regular, rate, and rhythm.  Normal s1,s2. No obvious murmurs, gallops, or rubs noted.  Palpable radial and pedal pulses bilaterally Lungs: CTAB, no wheezes, rhonchi, or rales noted.  Respiratory effort nonlabored Abd: soft, NT, ND, +BS, no masses, hernias, or organomegaly MS: all 4 extremities are symmetrical with no cyanosis, clubbing, or edema.  Decreased ROM of BUE secondary to arthritis. Skin: warm and dry with no masses, lesions, or rashes Neuro: Cranial  nerves 2-12 grossly intact, sensation is normal throughout Psych: A&Ox3 with an appropriate affect.   Results for orders placed or performed during the hospital encounter of 05/18/21 (from the past 48 hour(s))  CBC     Status: Abnormal   Collection Time: 05/18/21  6:11 PM  Result Value Ref Range   WBC 5.7 4.0 - 10.5 K/uL   RBC 3.02 (L) 3.87 - 5.11 MIL/uL   Hemoglobin 8.2 (L) 12.0 - 15.0 g/dL   HCT 25.8 (L) 36.0 - 46.0 %   MCV 85.4 80.0 - 100.0 fL   MCH 27.2 26.0 - 34.0 pg   MCHC 31.8 30.0 - 36.0 g/dL   RDW 14.8 11.5 - 15.5 %   Platelets 376 150 - 400 K/uL   nRBC 0.0 0.0 - 0.2 %    Comment: Performed at [facility], [place] [address], [place], [place] [address]  Comprehensive metabolic panel     Status: Abnormal   Collection Time: 05/18/21  6:11 PM  Result Value Ref Range   Sodium 136 135 - 145 mmol/L   Potassium 4.1 3.5 - 5.1 mmol/L   Chloride 105 98 - 111 mmol/L   CO2 21 (L) 22 - 32 mmol/L   Glucose, Bld 104 (H) 70 - 99 mg/dL    Comment: Glucose reference range applies only to samples taken after fasting for at least 8 hours.   BUN 38 (H) 8 - 23 mg/dL   Creatinine, Ser 1.09 (H) 0.44 - 1.00 mg/dL   Calcium 10.0 8.9 - 10.3 mg/dL   Total Protein 7.0 6.5 - 8.1 g/dL   Albumin 4.4 3.5 - 5.0 g/dL   AST 20 15 - 41 U/L   ALT 14 0 - 44 U/L   Alkaline Phosphatase 57 38 - 126 U/L   Total Bilirubin 0.9 0.3 - 1.2 mg/dL   GFR, Estimated 50 (L) >60 mL/min    Comment: (NOTE) Calculated using the CKD-EPI Creatinine Equation (2021)    Anion gap 10 5 - 15    Comment: Performed at [facility], [place] [address] [place], [place] [address]  Protime-INR     Status: None   Collection Time: 05/18/21  6:11 PM  Result Value Ref Range   Prothrombin Time 12.9 11.4 - 15.2 seconds   INR 1.0 0.8 - 1.2    Comment: (NOTE) INR goal varies based on device and disease states. Performed at [facility], [place] [address] [place], [place] [address]    APTT     Status: None   Collection Time: 05/18/21  6:11 PM  Result Value Ref Range   aPTT 26 24 -  36 seconds    Comment: Performed at [facility], [place] [address], [place], [place] [address]  Type and screen [place]     Status: None   Collection Time: 05/18/21  6:11 PM  Result Value Ref Range   ABO/RH(D) O POS    Antibody Screen NEG    Sample Expiration      05/21/2021,2359 Performed at [facility], [place] [address], [place], [place] [address]   SARS CORONAVIRUS 2 (TAT 6-24 HRS) Nasopharyngeal Nasopharyngeal Swab     Status: None   Collection Time: 05/18/21  6:45 PM   Specimen: Nasopharyngeal Swab  Result Value Ref Range   SARS Coronavirus 2 NEGATIVE NEGATIVE    Comment: (NOTE) SARS-CoV-2 target nucleic acids are NOT DETECTED.  The SARS-CoV-2 RNA is generally detectable in upper and lower respiratory specimens during the acute phase of infection. Negative results do not preclude SARS-CoV-2 infection, do not rule out co-infections with other pathogens, and should not be used as the sole basis for treatment or other patient management decisions. Negative results must be combined with clinical observations, patient history, and epidemiological information. The expected result is Negative.  Fact Sheet for Patients: SugarRoll.be  Fact Sheet for Healthcare Providers: https://www.woods-mathews.com/  This test is not yet approved or cleared by the [place] FDA and  has been authorized for detection and/or diagnosis of SARS-CoV-2 by FDA under an Emergency Use Authorization (EUA). This EUA will remain  in effect (meaning this test can be used) for the duration of the COVID-19 declaration under Se ction 564(b)(1) of the Act, 21 U.S.C. section 360bbb-3(b)(1), unless the authorization is terminated or revoked sooner.  Performed at [facility], [place] [address],  [place], [place] [address]   CBC     Status: Abnormal   Collection Time: 05/18/21 11:34 PM  Result Value Ref Range   WBC 5.4 4.0 - 10.5 K/uL   RBC 2.90 (L) 3.87 - 5.11 MIL/uL   Hemoglobin 7.8 (L) 12.0 - 15.0 g/dL   HCT 24.4 (L) 36.0 - 46.0 %   MCV 84.1 80.0 - 100.0 fL   MCH 26.9 26.0 - 34.0 pg   MCHC 32.0 30.0 - 36.0 g/dL   RDW 14.6 11.5 - 15.5 %   Platelets 368 150 - 400 K/uL   nRBC 0.0 0.0 - 0.2 %    Comment: Performed at [facility], [place] [address] [place], [place] [address]  CBC     Status: Abnormal   Collection Time: 05/19/21  5:42 AM  Result Value Ref Range   WBC 5.8 4.0 - 10.5 K/uL   RBC 2.82 (L) 3.87 - 5.11 MIL/uL   Hemoglobin 7.6 (L) 12.0 - 15.0 g/dL   HCT 24.2 (L) 36.0 - 46.0 %   MCV 85.8 80.0 - 100.0 fL   MCH 27.0 26.0 - 34.0 pg   MCHC 31.4 30.0 - 36.0 g/dL   RDW 14.5 11.5 - 15.5 %   Platelets 352 150 - 400 K/uL   nRBC 0.0 0.0 - 0.2 %    Comment: Performed at [facility], [place] [address] [place], [place] [address]  Basic metabolic panel     Status: Abnormal   Collection Time: 05/19/21  5:42 AM  Result Value Ref Range   Sodium 138 135 - 145 mmol/L   Potassium 3.9 3.5 - 5.1 mmol/L   Chloride 109 98 - 111 mmol/L   CO2 22 22 - 32 mmol/L  Glucose, Bld 96 70 - 99 mg/dL    Comment: Glucose reference range applies only to samples taken after fasting for at least 8 hours.   BUN 29 (H) 8 - 23 mg/dL   Creatinine, Ser 0.86 0.44 - 1.00 mg/dL   Calcium 9.7 8.9 - 10.3 mg/dL   GFR, Estimated >60 >60 mL/min    Comment: (NOTE) Calculated using the CKD-EPI Creatinine Equation (2021)    Anion gap 7 5 - 15    Comment: Performed at [facility], [place] [address] [place], [place] [address]  CBC     Status: Abnormal   Collection Time: 05/19/21  4:48 PM  Result Value Ref Range   WBC 4.4 4.0 - 10.5 K/uL   RBC 2.93 (L) 3.87 - 5.11 MIL/uL   Hemoglobin 7.9 (L) 12.0 - 15.0 g/dL   HCT 25.1 (L) 36.0 - 46.0 %   MCV 85.7 80.0 -  100.0 fL   MCH 27.0 26.0 - 34.0 pg   MCHC 31.5 30.0 - 36.0 g/dL   RDW 14.6 11.5 - 15.5 %   Platelets 338 150 - 400 K/uL   nRBC 0.0 0.0 - 0.2 %    Comment: Performed at [facility], [place] [address] [place], [place] [address]  CEA     Status: None   Collection Time: 05/19/21  4:48 PM  Result Value Ref Range   CEA 2.6 0.0 - 4.7 ng/mL    Comment: (NOTE)                             Nonsmokers          <3.9                             Smokers             <5.6 Roche [employer] Electrochemiluminescence Immunoassay (ECLIA) Values obtained with different assay methods or kits cannot be used interchangeably.  Results cannot be interpreted as absolute evidence of the presence or absence of malignant disease. Performed At: [facility] [place], [place] [id number] Rush Farmer MD HM:[id number]   CBC     Status: Abnormal   Collection Time: 05/20/21  5:40 AM  Result Value Ref Range   WBC 4.7 4.0 - 10.5 K/uL   RBC 2.72 (L) 3.87 - 5.11 MIL/uL   Hemoglobin 7.4 (L) 12.0 - 15.0 g/dL   HCT 23.0 (L) 36.0 - 46.0 %   MCV 84.6 80.0 - 100.0 fL   MCH 27.2 26.0 - 34.0 pg   MCHC 32.2 30.0 - 36.0 g/dL   RDW 14.9 11.5 - 15.5 %   Platelets 332 150 - 400 K/uL   nRBC 0.0 0.0 - 0.2 %    Comment: Performed at [facility], [place] [address], [place], [place] [address]  Basic metabolic panel     Status: None   Collection Time: 05/20/21  5:40 AM  Result Value Ref Range   Sodium 138 135 - 145 mmol/L   Potassium 4.0 3.5 - 5.1 mmol/L   Chloride 111 98 - 111 mmol/L   CO2 22 22 - 32 mmol/L   Glucose, Bld 97 70 - 99 mg/dL    Comment: Glucose reference range applies only to samples taken after fasting for at least 8 hours.   BUN 12 8 - 23 mg/dL   Creatinine, Ser 0.65  0.44 - 1.00 mg/dL   Calcium 10.1 8.9 - 10.3 mg/dL   GFR, Estimated >60 >60 mL/min    Comment: (NOTE) Calculated using the CKD-EPI Creatinine Equation (2021)    Anion gap 5 5 - 15     Comment: Performed at [facility], [place] [address] [place], [place] [address]   CT CHEST ABDOMEN PELVIS W CONTRAST  Result Date: 05/19/2021 CLINICAL DATA:  Colorectal cancer staging. EXAM: CT CHEST, ABDOMEN, AND PELVIS WITH CONTRAST TECHNIQUE: Multidetector CT imaging of the chest, abdomen and pelvis was performed following the standard protocol during bolus administration of intravenous contrast. CONTRAST:  30mL OMNIPAQUE IOHEXOL 350 MG/ML SOLN COMPARISON:  02/24/2014 FINDINGS: CT CHEST FINDINGS Cardiovascular: The heart is normal in size. No pericardial effusion. The aorta demonstrates moderate tortuosity, mild ectasia and moderate atherosclerotic calcifications. Dense three-vessel coronary artery calcifications are noted. Mediastinum/Nodes: No mediastinal or hilar mass or adenopathy. The esophagus is grossly normal. There is a complex 4.5 x 4.2 cm mass associated with the left thyroid lobe. This has been evaluated on previous imaging. (ref: J Am Coll Radiol. 2015 Feb;12(2): 143-50). Lungs/Pleura: No acute pulmonary findings. No worrisome pulmonary lesions. No pulmonary nodules to suggest pulmonary metastatic disease. Musculoskeletal: Severe degenerative changes involving both shoulders with joint effusions and large bursal fluid collections. No acute bony findings or destructive bony changes. Cervical spine fusion hardware is noted. Advanced degenerative changes involving the thoracolumbar spine and significant scoliosis. CT ABDOMEN PELVIS FINDINGS Hepatobiliary: No hepatic lesions to suggest metastatic disease. No intrahepatic biliary dilatation. The gallbladder is grossly normal. No common bile duct dilatation. Pancreas: No mass, inflammation or ductal dilatation. Spleen: Normal size.  No focal lesions. Adrenals/Urinary Tract: Adrenal glands and kidneys are grossly normal. No worrisome renal lesions or hydronephrosis. The bladder is unremarkable. Stomach/Bowel: The stomach, duodenum and  small bowel are unremarkable. There is an apple-core type neoplasm involving the ascending colon. No findings to suggest tumor invasion of the pericolonic fat. No adjacent ileocolic adenopathy. The remainder of the colon is grossly normal. Vascular/Lymphatic: Advanced atherosclerotic calcifications involving the aorta and iliac arteries and branch vessels but no aneurysm. Major venous structures are patent. No mesenteric or retroperitoneal mass or adenopathy. Reproductive: Surgically absent. Other: There is a 3.5 cm cystic structure in the subcutaneous fat overlying the left abdominal oblique muscles. This is likely a benign subcutaneous cyst. No significant change since the prior study 2015. Musculoskeletal: No significant bony findings. Stable degenerative changes involving the spine hips. No worrisome bone lesions. IMPRESSION: 1. 2.5 cm apple-core type ascending colon lesion consistent with known colon cancer. No findings for local tumor invasion of the pericolonic fat or adjacent ileocolic adenopathy. 2. No findings for metastatic disease involving the chest, abdomen or pelvis. 3. Advanced atherosclerotic calcifications involving the thoracic and abdominal aorta and branch vessels including the coronary arteries. 4. Severe degenerative changes involving both shoulders with joint effusions and large bursal fluid collections. 5. Stable complex left thyroid lobe mass, previously addressed. 6. Aortic atherosclerosis. Aortic Atherosclerosis (ICD10-I70.0). Electronically Signed   By: Marijo Sanes M.D.   On: 05/19/2021 21:41      Assessment/Plan Partially obstructing bleeding right colon mass The patient has been evaluated and her imaging has been reviewed.  She will require a lap assisted right hemicolectomy which we will plan to do tomorrow morning.  We will type and screen again to have an active one for tomorrow.  Her hgb today is 7.4.  may need some blood prior to surgery.  Will  discuss with surgeon.  The  pathophysiology as well as expected procedure has been discussed.  She understands and is agreeable to proceed.  She may have CLD today and do NOT advance past this.  NPO p 0430am  as preop ERAS colon orders in place.   FEN - CLD, NPO p 0430am, preop shake VTE - on hold due to bleeding ID - cefotetan on call   ABL anemia secondary to above HTN HLD OA GERD   Henreitta Cea, [facility] 05/20/2021, 8:49 AM Please see Amion for pager number during day hours 7:00am-4:30pm or 7:00am -11:30am on weekends

## 2021-05-21 ENCOUNTER — Other Ambulatory Visit: Payer: Self-pay

## 2021-05-21 ENCOUNTER — Encounter (HOSPITAL_COMMUNITY): Admission: AD | Disposition: A | Discharge status: Home / Self Care | Payer: Self-pay | Source: Ambulatory Visit

## 2021-05-21 ENCOUNTER — Encounter (HOSPITAL_COMMUNITY): Payer: Self-pay | Admitting: Internal Medicine

## 2021-05-21 ENCOUNTER — Inpatient Hospital Stay (HOSPITAL_COMMUNITY): Payer: Medicare Other | Admitting: Anesthesiology

## 2021-05-21 HISTORY — PX: LAPAROSCOPIC RIGHT HEMI COLECTOMY: SHX5926

## 2021-05-21 LAB — POCT I-STAT, CHEM 8
BUN: 8 mg/dL (ref 8–23)
Calcium, Ion: 1.37 mmol/L (ref 1.15–1.40)
Chloride: 102 mmol/L (ref 98–111)
Creatinine, Ser: 0.9 mg/dL (ref 0.44–1.00)
Glucose, Bld: 149 mg/dL — ABNORMAL HIGH (ref 70–99)
HCT: 30 % — ABNORMAL LOW (ref 36.0–46.0)
Hemoglobin: 10.2 g/dL — ABNORMAL LOW (ref 12.0–15.0)
Potassium: 4.2 mmol/L (ref 3.5–5.1)
Sodium: 134 mmol/L — ABNORMAL LOW (ref 135–145)
TCO2: 21 mmol/L — ABNORMAL LOW (ref 22–32)

## 2021-05-21 LAB — SURGICAL PATHOLOGY

## 2021-05-21 LAB — CBC
HCT: 23.3 % — ABNORMAL LOW (ref 36.0–46.0)
Hemoglobin: 7.4 g/dL — ABNORMAL LOW (ref 12.0–15.0)
MCH: 26.9 pg (ref 26.0–34.0)
MCHC: 31.8 g/dL (ref 30.0–36.0)
MCV: 84.7 fL (ref 80.0–100.0)
Platelets: 311 10*3/uL (ref 150–400)
RBC: 2.75 MIL/uL — ABNORMAL LOW (ref 3.87–5.11)
RDW: 14.6 % (ref 11.5–15.5)
WBC: 4.3 10*3/uL (ref 4.0–10.5)
nRBC: 0 % (ref 0.0–0.2)

## 2021-05-21 LAB — PREPARE RBC (CROSSMATCH)

## 2021-05-21 LAB — CREATININE, SERUM
Creatinine, Ser: 0.86 mg/dL (ref 0.44–1.00)
GFR, Estimated: >60 mL/min (ref >60)

## 2021-05-21 LAB — ABO/RH: ABO/RH(D): O POS

## 2021-05-21 SURGERY — LAPAROSCOPIC RIGHT HEMI COLECTOMY
Anesthesia: General | Site: Abdomen

## 2021-05-21 MED ORDER — FENTANYL CITRATE (PF) 100 MCG/2ML IJ SOLN
INTRAMUSCULAR | Status: AC
  Filled 2021-05-21: qty 2

## 2021-05-21 MED ORDER — CHLORHEXIDINE GLUCONATE CLOTH 2 % EX PADS
6.0000 | MEDICATED_PAD | Freq: Every day | CUTANEOUS | Status: DC
  Administered 2021-05-21 – 2021-05-23 (×3): 6 via TOPICAL

## 2021-05-21 MED ORDER — ALVIMOPAN 12 MG PO CAPS
12.0000 mg | ORAL_CAPSULE | Freq: Two times a day (BID) | ORAL | Status: DC
  Administered 2021-05-22 – 2021-05-23 (×3): 12 mg via ORAL
  Filled 2021-05-21 (×3): qty 1

## 2021-05-21 MED ORDER — ONDANSETRON HCL 4 MG/2ML IJ SOLN: 4.0000 mg | Freq: Four times a day (QID) | INTRAMUSCULAR | Status: DC | PRN

## 2021-05-21 MED ORDER — LACTATED RINGERS IR SOLN
Status: DC | PRN
  Administered 2021-05-21: 1000 mL

## 2021-05-21 MED ORDER — PROPOFOL 10 MG/ML IV BOLUS
INTRAVENOUS | Status: DC | PRN
  Administered 2021-05-21: 120 mg via INTRAVENOUS

## 2021-05-21 MED ORDER — ROCURONIUM BROMIDE 10 MG/ML (PF) SYRINGE
PREFILLED_SYRINGE | INTRAVENOUS | Status: AC
  Filled 2021-05-21: qty 10

## 2021-05-21 MED ORDER — ENOXAPARIN SODIUM 40 MG/0.4ML IJ SOSY
40.0000 mg | PREFILLED_SYRINGE | INTRAMUSCULAR | Status: DC
  Administered 2021-05-21 – 2021-05-23 (×3): 40 mg via SUBCUTANEOUS
  Filled 2021-05-21 (×3): qty 0.4

## 2021-05-21 MED ORDER — ALUM & MAG HYDROXIDE-SIMETH 200-200-20 MG/5ML PO SUSP: 30.0000 mL | Freq: Four times a day (QID) | ORAL | Status: DC | PRN

## 2021-05-21 MED ORDER — BUPIVACAINE LIPOSOME 1.3 % IJ SUSP
INTRAMUSCULAR | Status: AC
  Filled 2021-05-21: qty 20

## 2021-05-21 MED ORDER — BUPIVACAINE LIPOSOME 1.3 % IJ SUSP
INTRAMUSCULAR | Status: DC | PRN
  Administered 2021-05-21: 20 mL

## 2021-05-21 MED ORDER — FENTANYL CITRATE PF 50 MCG/ML IJ SOSY
25.0000 ug | PREFILLED_SYRINGE | INTRAMUSCULAR | Status: DC | PRN
  Administered 2021-05-21: 50 ug via INTRAVENOUS

## 2021-05-21 MED ORDER — EPHEDRINE 5 MG/ML INJ
INTRAVENOUS | Status: AC
  Filled 2021-05-21: qty 5

## 2021-05-21 MED ORDER — PROPOFOL 10 MG/ML IV BOLUS
INTRAVENOUS | Status: AC
  Filled 2021-05-21: qty 20

## 2021-05-21 MED ORDER — ACETAMINOPHEN 500 MG PO TABS
1000.0000 mg | ORAL_TABLET | Freq: Four times a day (QID) | ORAL | Status: DC
  Administered 2021-05-21 – 2021-05-23 (×10): 1000 mg via ORAL
  Filled 2021-05-21 (×12): qty 2

## 2021-05-21 MED ORDER — DEXAMETHASONE SODIUM PHOSPHATE 10 MG/ML IJ SOLN
INTRAMUSCULAR | Status: AC
  Filled 2021-05-21: qty 1

## 2021-05-21 MED ORDER — EPHEDRINE SULFATE-NACL 50-0.9 MG/10ML-% IV SOSY
PREFILLED_SYRINGE | INTRAVENOUS | Status: DC | PRN
  Administered 2021-05-21: 10 mg via INTRAVENOUS

## 2021-05-21 MED ORDER — SUCCINYLCHOLINE CHLORIDE 200 MG/10ML IV SOSY
PREFILLED_SYRINGE | INTRAVENOUS | Status: DC | PRN
  Administered 2021-05-21: 120 mg via INTRAVENOUS

## 2021-05-21 MED ORDER — SODIUM CHLORIDE 0.9 % IV SOLN: 10.0000 mL/h | Freq: Once | INTRAVENOUS | Status: DC

## 2021-05-21 MED ORDER — DEXAMETHASONE SODIUM PHOSPHATE 10 MG/ML IJ SOLN
INTRAMUSCULAR | Status: DC | PRN
  Administered 2021-05-21: 8 mg via INTRAVENOUS

## 2021-05-21 MED ORDER — SODIUM CHLORIDE 0.9 % IR SOLN
Status: DC | PRN
  Administered 2021-05-21: 2000 mL

## 2021-05-21 MED ORDER — ONDANSETRON HCL 4 MG PO TABS: 4.0000 mg | ORAL_TABLET | Freq: Four times a day (QID) | ORAL | Status: DC | PRN

## 2021-05-21 MED ORDER — HYDRALAZINE HCL 20 MG/ML IJ SOLN
5.0000 mg | Freq: Once | INTRAMUSCULAR | Status: AC
  Administered 2021-05-21: 5 mg via INTRAVENOUS

## 2021-05-21 MED ORDER — FENTANYL CITRATE PF 50 MCG/ML IJ SOSY
PREFILLED_SYRINGE | INTRAMUSCULAR | Status: AC
  Filled 2021-05-21: qty 2

## 2021-05-21 MED ORDER — LIDOCAINE HCL (PF) 2 % IJ SOLN
INTRAMUSCULAR | Status: AC
  Filled 2021-05-21: qty 5

## 2021-05-21 MED ORDER — BUPIVACAINE-EPINEPHRINE (PF) 0.25% -1:200000 IJ SOLN
INTRAMUSCULAR | Status: AC
  Filled 2021-05-21: qty 30

## 2021-05-21 MED ORDER — INFLUENZA VAC A&B SA ADJ QUAD 0.5 ML IM PRSY
0.5000 mL | PREFILLED_SYRINGE | INTRAMUSCULAR | Status: AC
  Administered 2021-05-24: 0.5 mL via INTRAMUSCULAR
  Filled 2021-05-21: qty 0.5

## 2021-05-21 MED ORDER — DIPHENHYDRAMINE HCL 50 MG/ML IJ SOLN: 12.5000 mg | Freq: Four times a day (QID) | INTRAMUSCULAR | Status: DC | PRN

## 2021-05-21 MED ORDER — ROCURONIUM BROMIDE 100 MG/10ML IV SOLN
INTRAVENOUS | Status: DC | PRN
  Administered 2021-05-21: 60 mg via INTRAVENOUS

## 2021-05-21 MED ORDER — SODIUM CHLORIDE 0.9 % IV SOLN: INTRAVENOUS | Status: DC | PRN

## 2021-05-21 MED ORDER — PHENYLEPHRINE HCL-NACL 20-0.9 MG/250ML-% IV SOLN
INTRAVENOUS | Status: DC | PRN
  Administered 2021-05-21: 40 ug/min via INTRAVENOUS

## 2021-05-21 MED ORDER — ONDANSETRON HCL 4 MG/2ML IJ SOLN
INTRAMUSCULAR | Status: AC
  Filled 2021-05-21: qty 2

## 2021-05-21 MED ORDER — PHENYLEPHRINE 40 MCG/ML (10ML) SYRINGE FOR IV PUSH (FOR BLOOD PRESSURE SUPPORT)
PREFILLED_SYRINGE | INTRAVENOUS | Status: DC | PRN
  Administered 2021-05-21: 120 ug via INTRAVENOUS
  Administered 2021-05-21 (×3): 80 ug via INTRAVENOUS
  Administered 2021-05-21: 120 ug via INTRAVENOUS

## 2021-05-21 MED ORDER — LIDOCAINE 2% (20 MG/ML) 5 ML SYRINGE
INTRAMUSCULAR | Status: DC | PRN
  Administered 2021-05-21: 100 mg via INTRAVENOUS

## 2021-05-21 MED ORDER — SUGAMMADEX SODIUM 200 MG/2ML IV SOLN
INTRAVENOUS | Status: DC | PRN
  Administered 2021-05-21: 150 mg via INTRAVENOUS

## 2021-05-21 MED ORDER — BUPIVACAINE-EPINEPHRINE 0.25% -1:200000 IJ SOLN
INTRAMUSCULAR | Status: DC | PRN
  Administered 2021-05-21: 30 mL

## 2021-05-21 MED ORDER — PHENYLEPHRINE HCL (PRESSORS) 10 MG/ML IV SOLN
INTRAVENOUS | Status: AC
  Filled 2021-05-21: qty 2

## 2021-05-21 MED ORDER — LABETALOL HCL 5 MG/ML IV SOLN
INTRAVENOUS | Status: DC | PRN
  Administered 2021-05-21: 5 mg via INTRAVENOUS

## 2021-05-21 MED ORDER — SODIUM CHLORIDE 0.9 % IV SOLN: INTRAVENOUS | Status: DC

## 2021-05-21 MED ORDER — HYDRALAZINE HCL 20 MG/ML IJ SOLN
INTRAMUSCULAR | Status: AC
  Filled 2021-05-21: qty 1

## 2021-05-21 MED ORDER — MORPHINE SULFATE (PF) 2 MG/ML IV SOLN: 2.0000 mg | INTRAVENOUS | Status: DC | PRN

## 2021-05-21 MED ORDER — ONDANSETRON HCL 4 MG/2ML IJ SOLN
INTRAMUSCULAR | Status: DC | PRN
  Administered 2021-05-21: 4 mg via INTRAVENOUS

## 2021-05-21 MED ORDER — SACCHAROMYCES BOULARDII 250 MG PO CAPS
250.0000 mg | ORAL_CAPSULE | Freq: Two times a day (BID) | ORAL | Status: DC
  Administered 2021-05-21 – 2021-05-24 (×7): 250 mg via ORAL
  Filled 2021-05-21 (×8): qty 1

## 2021-05-21 MED ORDER — GABAPENTIN 300 MG PO CAPS
300.0000 mg | ORAL_CAPSULE | Freq: Two times a day (BID) | ORAL | Status: DC
  Administered 2021-05-21 – 2021-05-24 (×7): 300 mg via ORAL
  Filled 2021-05-21 (×8): qty 1

## 2021-05-21 MED ORDER — FENTANYL CITRATE (PF) 100 MCG/2ML IJ SOLN
INTRAMUSCULAR | Status: DC | PRN
  Administered 2021-05-21 (×6): 50 ug via INTRAVENOUS

## 2021-05-21 MED ORDER — LACTATED RINGERS IV SOLN: INTRAVENOUS | Status: DC

## 2021-05-21 MED ORDER — ENSURE SURGERY PO LIQD
237.0000 mL | Freq: Two times a day (BID) | ORAL | Status: DC
  Administered 2021-05-21 – 2021-05-24 (×6): 237 mL via ORAL
  Filled 2021-05-21 (×7): qty 237

## 2021-05-21 MED ORDER — SIMETHICONE 80 MG PO CHEW: 40.0000 mg | CHEWABLE_TABLET | Freq: Four times a day (QID) | ORAL | Status: DC | PRN

## 2021-05-21 MED ORDER — DIPHENHYDRAMINE HCL 12.5 MG/5ML PO ELIX: 12.5000 mg | ORAL_SOLUTION | Freq: Four times a day (QID) | ORAL | Status: DC | PRN

## 2021-05-21 SURGICAL SUPPLY — 64 items
ADH SKN CLS APL DERMABOND .7 (GAUZE/BANDAGES/DRESSINGS) ×1
APL PRP STRL LF DISP 70% ISPRP (MISCELLANEOUS) ×1
APPLIER CLIP ROT 10 11.4 M/L (STAPLE)
APR CLP MED LRG 11.4X10 (STAPLE)
BAG COUNTER SPONGE SURGICOUNT (BAG) IMPLANT
BAG SPNG CNTER NS LX DISP (BAG)
BLADE CLIPPER SURG (BLADE) ×2 IMPLANT
CABLE HIGH FREQUENCY MONO STRZ (ELECTRODE) IMPLANT
CELLS DAT CNTRL 66122 CELL SVR (MISCELLANEOUS) IMPLANT
CHLORAPREP W/TINT 26 (MISCELLANEOUS) ×2 IMPLANT
CLIP APPLIE ROT 10 11.4 M/L (STAPLE) IMPLANT
DECANTER SPIKE VIAL GLASS SM (MISCELLANEOUS) ×2 IMPLANT
DERMABOND ADVANCED (GAUZE/BANDAGES/DRESSINGS) ×1
DERMABOND ADVANCED .7 DNX12 (GAUZE/BANDAGES/DRESSINGS) ×1 IMPLANT
DISSECTOR BLUNT TIP ENDO 5MM (MISCELLANEOUS) IMPLANT
ELECT REM PT RETURN 15FT ADLT (MISCELLANEOUS) ×2 IMPLANT
GAUZE SPONGE 4X4 12PLY STRL (GAUZE/BANDAGES/DRESSINGS) IMPLANT
GLOVE SURG ENC MOIS LTX SZ7.5 (GLOVE) ×4 IMPLANT
GLOVE SURG NEOPR MICRO LF SZ8 (GLOVE) ×4 IMPLANT
GOWN STRL REUS W/TWL XL LVL3 (GOWN DISPOSABLE) ×8 IMPLANT
KIT TURNOVER KIT A (KITS) ×2 IMPLANT
LIGASURE IMPACT 36 18CM CVD LR (INSTRUMENTS) IMPLANT
LIGASURE VESSEL 5MM BLUNT TIP (ELECTROSURGICAL) ×2 IMPLANT
NS IRRIG 1000ML POUR BTL (IV SOLUTION) ×2 IMPLANT
PACK COLON (CUSTOM PROCEDURE TRAY) ×2 IMPLANT
PAD POSITIONING PINK XL (MISCELLANEOUS) ×2 IMPLANT
PENCIL SMOKE EVACUATOR (MISCELLANEOUS) ×2 IMPLANT
PROTECTOR NERVE ULNAR (MISCELLANEOUS) IMPLANT
RELOAD PROXIMATE 75MM BLUE (ENDOMECHANICALS) ×4 IMPLANT
RTRCTR WOUND ALEXIS 18CM MED (MISCELLANEOUS)
SCISSORS LAP 5X35 DISP (ENDOMECHANICALS) ×2 IMPLANT
SEALER TISSUE G2 STRG ARTC 35C (ENDOMECHANICALS) IMPLANT
SET IRRIG TUBING LAPAROSCOPIC (IRRIGATION / IRRIGATOR) ×2 IMPLANT
SET TUBE SMOKE EVAC HIGH FLOW (TUBING) ×2 IMPLANT
SHEARS HARMONIC ACE PLUS 36CM (ENDOMECHANICALS) IMPLANT
SLEEVE ADV FIXATION 5X100MM (TROCAR) IMPLANT
SLEEVE ENDOPATH XCEL 5M (ENDOMECHANICALS) ×2 IMPLANT
SLEEVE XCEL OPT CAN 5 100 (ENDOMECHANICALS) ×6 IMPLANT
STAPLER GUN LINEAR PROX 60 (STAPLE) ×2 IMPLANT
STAPLER PROXIMATE 75MM BLUE (STAPLE) ×2 IMPLANT
STAPLER VISISTAT 35W (STAPLE) IMPLANT
SUT MNCRL AB 4-0 PS2 18 (SUTURE) ×4 IMPLANT
SUT PDS AB 1 CT1 27 (SUTURE) ×4 IMPLANT
SUT PROLENE 2 0 CT2 30 (SUTURE) IMPLANT
SUT PROLENE 2 0 KS (SUTURE) IMPLANT
SUT SILK 2 0 (SUTURE) ×2
SUT SILK 2 0 SH CR/8 (SUTURE) ×2 IMPLANT
SUT SILK 2-0 18XBRD TIE 12 (SUTURE) IMPLANT
SUT SILK 2-0 30XBRD TIE 12 (SUTURE) ×1 IMPLANT
SUT SILK 3 0 (SUTURE)
SUT SILK 3 0 12 30 (SUTURE) ×2 IMPLANT
SUT SILK 3 0 SH CR/8 (SUTURE) ×4 IMPLANT
SUT SILK 3-0 18XBRD TIE 12 (SUTURE) IMPLANT
SYS LAPSCP GELPORT 120MM (MISCELLANEOUS) ×2
SYSTEM LAPSCP GELPORT 120MM (MISCELLANEOUS) ×1 IMPLANT
TAPE CLOTH 4X10 WHT NS (GAUZE/BANDAGES/DRESSINGS) IMPLANT
TOWEL OR 17X26 10 PK STRL BLUE (TOWEL DISPOSABLE) ×2 IMPLANT
TRAY FOLEY MTR SLVR 14FR STAT (SET/KITS/TRAYS/PACK) ×2 IMPLANT
TRAY FOLEY MTR SLVR 16FR STAT (SET/KITS/TRAYS/PACK) IMPLANT
TROCAR ADV FIXATION 5X100MM (TROCAR) IMPLANT
TROCAR BALLN 12MMX100 BLUNT (TROCAR) IMPLANT
TROCAR XCEL NON-BLD 11X100MML (ENDOMECHANICALS) IMPLANT
TUBING CONNECTING 10 (TUBING) ×4 IMPLANT
YANKAUER SUCT BULB TIP NO VENT (SUCTIONS) ×2 IMPLANT

## 2021-05-21 NOTE — Progress Notes (Addendum)
[facility] signoff note   The care of this patient has been transferred from the [facility] service to the following service:  Service: [facility] Attending: Dr. Michaelle Birks. I have discussed with Dr. Michaelle Birks Date & time: 05/21/2021, 10:50 AM  Dr. Zenia Resides reached out to me postprocedure (laparoscopic-assisted right hemicolectomy with stapled side-to-side ileocolic anastomosis) and offered to take patient onto [facility] service and advised that since there were no major medical issues, [facility] can sign off at this time and they will re consult [facility] if any needs arise.  Please re consult the [facility] team for any further assistance.  I did not see the patient today because she was already in the operating room by the time I arrived.  Thank you   [employer]. MD [facility]   To contact a [facility] provider, please log into the web site [email or web address] and access using universal [place] password for that web site. If you do not have the password, please call the hospital operator. Page "[place]" listed on top of the page. Provide a number where you can be directly reached.

## 2021-05-21 NOTE — Op Note (Signed)
Date: 05/21/21  Patient: Cindy Patterson MRN: [id number]  Preoperative Diagnosis: Right colon adenocarcinoma Postoperative Diagnosis: Same  Procedure: Laparoscopic-assisted right hemicolectomy with stapled side-to-side ileocolic anastomosis  Surgeon: Michaelle Birks, MD Assistant: Barkley Boards, PA-C  EBL: Minimal  Anesthesia: General endotracheal  Specimens: Right colon and terminal ileum, stitch marks distal margin  Indications: Cindy Patterson is an 86 year old female who presented with fatigue and symptomatic anemia, as well as melena.  She underwent a colonoscopy 2 days ago which showed an ulcerated mass in the ascending colon.  Biopsies confirmed adenocarcinoma.  Staging CT scans did not show any evidence of metastatic disease.  She consented to proceed with right hemicolectomy.  Findings: Mass within the ascending colon proximal to the hepatic flexure.  No evidence of metastatic disease within the abdomen.  Procedure details: Informed consent was obtained in the preoperative area prior to the procedure. The patient was brought to the operating room and placed on the table in the supine position.  General anesthesia was induced and appropriate lines and drains were placed for intraoperative monitoring. Perioperative antibiotics were administered per SCIP guidelines. The abdomen was prepped and draped in the usual sterile fashion. A pre-procedure timeout was taken verifying patient identity, surgical site and procedure to be performed.  A small incision was made at Palmer's point in the left upper quadrant, the fascia was grasped and elevated, and a Veress needle was inserted through the fascia. Intraperitoneal placement was confirmed with the saline drop test and the abdomen was insufflated.  The abdomen was inspected and there was no evidence of visceral or vascular injury.  There was a circular piece of mesh visible on the abdominal wall at the umbilicus.  There is no evidence of metastatic  disease within the abdomen.  Additional 5 mm ports were placed in the left lower quadrant and in the midline below the umbilicus under direct visualization, taking care to avoid the mesh.  A small midline epigastric incision was made superior to the mesh, the subcutaneous tissue was divided with cautery, and the fascia was opened at the linea alba.  A GelPort was inserted through this incision, a 5 mm port was placed at the [place].  The right colon was then mobilized lateral to medial fashion by taking down the line of [place] using LigaSure.  Hepatic flexure and the proximal transverse colon were taken down with LigaSure.  The colon was further mobilized medially bluntly, separating the mesentery from the duodenum and head of the pancreas.  Once the right colon and terminal ileum were completely mobilized, the abdomen was desufflated and the cath was removed from the [place].  The colon and terminal ileum were exteriorized through the wound protector.  There was a palpable mass within the ascending colon proximal to the hepatic flexure.  The middle colic artery and its branches were identified.  The transverse colon was divided with a GIA 75 mm blue load stapler distal to the right branch of the middle colic artery.  The terminal ileum was then divided approximately 15 cm proximal to the ileocecal valve using a 75 mm blue load stapler.  The right branch of the middle colic artery was ligated near its origin with a 2-0 silk tie and divided.  The mesentery of the right colon was divided with LigaSure.  The ileocolic pedicle was isolated and a high ligation of the ileocolic vessels was performed using 2-0 silk suture ligatures.  The specimen was oriented and sent for routine pathology.  Next the distal ileum  was brought up next to the transverse colon and oriented so that the mesentery laid flat.  A side-to-side ileocolic anastomosis was created using a 75 m GIA stapler with a blue load.  After removing the stapler the  lumen of the bowel was visualized and there was no intraluminal bleeding along the staple line.  The common enterotomy was closed with a TA 60 stapler.  There was a small amount of oozing from the staple line which was oversewn with 3-0 silk sutures.  The mesenteric defect was closed with 3-0 silk figure-of-eight sutures.  The bowel was placed back into the abdomen, the GelPort was replaced and the abdomen was insufflated.  The abdomen was inspected and there was no evidence of bleeding in the mesentery layer and its proper orientation.  The omentum was brought down and laid over top of the anastomosis.  Ports were removed and the abdomen was desufflated.  New drapes were applied per ERAS protocol.  The fascia at the GelPort site was closed with a running 1 PDS suture.  Scarpa's fascia was closed with a running 3-0 Vicryl suture.  The skin at all port sites was closed with a 4-0 Monocryl subcuticular suture, and Dermabond was applied.  The patient tolerated the procedure with no apparent complications.  All counts were correct x2 at the end of the procedure. The patient was extubated and taken to PACU in stable condition.  Michaelle Birks, MD 05/21/21 10:25 AM

## 2021-05-21 NOTE — Anesthesia Procedure Notes (Signed)
Procedure Name: Intubation Date/Time: 05/21/2021 8:12 AM Performed by: Gwyndolyn Saxon, CRNA Pre-anesthesia Checklist: Patient identified, Emergency Drugs available, Suction available and Patient being monitored Patient Re-evaluated:Patient Re-evaluated prior to induction Oxygen Delivery Method: Circle system utilized Preoxygenation: Pre-oxygenation with 100% oxygen Induction Type: IV induction Ventilation: Mask ventilation without difficulty Laryngoscope Size: Glidescope and 3 Grade View: Grade I Tube type: Oral Tube size: 7.0 mm Number of attempts: 1 Airway Equipment and Method: Rigid stylet Placement Confirmation: ETT inserted through vocal cords under direct vision, positive ETCO2 and breath sounds checked- equal and bilateral Secured at: 21 cm Tube secured with: Tape Dental Injury: Teeth and Oropharynx as per pre-operative assessment  Comments: Pt with small mouth opening, narrow palate, and history on neck fusion. Easy mask ventilation

## 2021-05-21 NOTE — Transfer of Care (Signed)
Immediate Anesthesia Transfer of Care Note  Patient: Cindy Patterson  Procedure(s) Performed: LAPAROSCOPIC ASSISTED RIGHT HEMI COLECTOMY (Abdomen)  Patient Location: PACU  Anesthesia Type:General  Level of Consciousness: drowsy and patient cooperative  Airway & Oxygen Therapy: Patient Spontanous Breathing  Post-op Assessment: Report given to RN and Post -op Vital signs reviewed and stable  Post vital signs: Reviewed and stable  Last Vitals:  Vitals Value Taken Time  BP 192/88 05/21/21 1045  Temp 36.4 C 05/21/21 1035  Pulse 72 05/21/21 1050  Resp 13 05/21/21 1050  SpO2 100 % 05/21/21 1050  Vitals shown include unvalidated device data.  Last Pain:  Vitals:   05/21/21 1035  TempSrc:   PainSc: 8          Complications: No notable events documented.

## 2021-05-21 NOTE — Progress Notes (Signed)
Day of Surgery  Subjective: No acute changes. Ready for surgery today. Path confirmed adenocarcinoma.   Objective: Vital signs in last 24 hours: Temp:  [97.7 F (36.5 C)-98.3 F (36.8 C)] 97.7 F (36.5 C) (09/23 0514) Pulse Rate:  [71-81] 79 (09/23 0514) Resp:  [16-18] 16 (09/23 0514) BP: (129-176)/(50-88) 129/50 (09/23 0514) SpO2:  [98 %-99 %] 98 % (09/23 0514) Weight:  [67.9 kg] 67.9 kg (09/23 0617) Last BM Date: 05/19/21  Intake/Output from previous day: 09/22 0701 - 09/23 0700 In: 360 [P.O.:360] Out: -  Intake/Output this shift: No intake/output data recorded.  PE: General: resting comfortably, NAD Neuro: alert and oriented, no focal deficits Resp: normal work of breathing on room air Abdomen: soft, nondistended, nontender to palpation. Extremities: warm and well-perfused   Lab Results:  Recent Labs    05/20/21 0540 05/21/21 0516  WBC 4.7 4.3  HGB 7.4* 7.4*  HCT 23.0* 23.3*  PLT 332 311   BMET Recent Labs    05/19/21 0542 05/20/21 0540  NA 138 138  K 3.9 4.0  CL 109 111  CO2 22 22  GLUCOSE 96 97  BUN 29* 12  CREATININE 0.86 0.65  CALCIUM 9.7 10.1   PT/INR Recent Labs    05/18/21 1811  LABPROT 12.9  INR 1.0   CMP     Component Value Date/Time   NA 138 05/20/2021 0540   K 4.0 05/20/2021 0540   CL 111 05/20/2021 0540   CO2 22 05/20/2021 0540   GLUCOSE 97 05/20/2021 0540   BUN 12 05/20/2021 0540   CREATININE 0.65 05/20/2021 0540   CALCIUM 10.1 05/20/2021 0540   PROT 7.0 05/18/2021 1811   ALBUMIN 4.4 05/18/2021 1811   AST 20 05/18/2021 1811   ALT 14 05/18/2021 1811   ALKPHOS 57 05/18/2021 1811   BILITOT 0.9 05/18/2021 1811   GFRNONAA >60 05/20/2021 0540   Lipase  No results found for: LIPASE     Studies/Results: CT CHEST ABDOMEN PELVIS W CONTRAST  Result Date: 05/19/2021 CLINICAL DATA:  Colorectal cancer staging. EXAM: CT CHEST, ABDOMEN, AND PELVIS WITH CONTRAST TECHNIQUE: Multidetector CT imaging of the chest, abdomen  and pelvis was performed following the standard protocol during bolus administration of intravenous contrast. CONTRAST:  22mL OMNIPAQUE IOHEXOL 350 MG/ML SOLN COMPARISON:  02/24/2014 FINDINGS: CT CHEST FINDINGS Cardiovascular: The heart is normal in size. No pericardial effusion. The aorta demonstrates moderate tortuosity, mild ectasia and moderate atherosclerotic calcifications. Dense three-vessel coronary artery calcifications are noted. Mediastinum/Nodes: No mediastinal or hilar mass or adenopathy. The esophagus is grossly normal. There is a complex 4.5 x 4.2 cm mass associated with the left thyroid lobe. This has been evaluated on previous imaging. (ref: J Am Coll Radiol. 2015 Feb;12(2): 143-50). Lungs/Pleura: No acute pulmonary findings. No worrisome pulmonary lesions. No pulmonary nodules to suggest pulmonary metastatic disease. Musculoskeletal: Severe degenerative changes involving both shoulders with joint effusions and large bursal fluid collections. No acute bony findings or destructive bony changes. Cervical spine fusion hardware is noted. Advanced degenerative changes involving the thoracolumbar spine and significant scoliosis. CT ABDOMEN PELVIS FINDINGS Hepatobiliary: No hepatic lesions to suggest metastatic disease. No intrahepatic biliary dilatation. The gallbladder is grossly normal. No common bile duct dilatation. Pancreas: No mass, inflammation or ductal dilatation. Spleen: Normal size.  No focal lesions. Adrenals/Urinary Tract: Adrenal glands and kidneys are grossly normal. No worrisome renal lesions or hydronephrosis. The bladder is unremarkable. Stomach/Bowel: The stomach, duodenum and small bowel are unremarkable. There is an apple-core type  neoplasm involving the ascending colon. No findings to suggest tumor invasion of the pericolonic fat. No adjacent ileocolic adenopathy. The remainder of the colon is grossly normal. Vascular/Lymphatic: Advanced atherosclerotic calcifications involving the  aorta and iliac arteries and branch vessels but no aneurysm. Major venous structures are patent. No mesenteric or retroperitoneal mass or adenopathy. Reproductive: Surgically absent. Other: There is a 3.5 cm cystic structure in the subcutaneous fat overlying the left abdominal oblique muscles. This is likely a benign subcutaneous cyst. No significant change since the prior study 2015. Musculoskeletal: No significant bony findings. Stable degenerative changes involving the spine hips. No worrisome bone lesions. IMPRESSION: 1. 2.5 cm apple-core type ascending colon lesion consistent with known colon cancer. No findings for local tumor invasion of the pericolonic fat or adjacent ileocolic adenopathy. 2. No findings for metastatic disease involving the chest, abdomen or pelvis. 3. Advanced atherosclerotic calcifications involving the thoracic and abdominal aorta and branch vessels including the coronary arteries. 4. Severe degenerative changes involving both shoulders with joint effusions and large bursal fluid collections. 5. Stable complex left thyroid lobe mass, previously addressed. 6. Aortic atherosclerosis. Aortic Atherosclerosis (ICD10-I70.0). Electronically Signed   By: Marijo Sanes M.D.   On: 05/19/2021 21:41    Anti-infectives: Anti-infectives (From admission, onward)    Start     Dose/Rate Route Frequency Ordered Stop   05/21/21 0600  cefoTEtan (CEFOTAN) 2 g in sodium chloride 0.9 % 100 mL IVPB  Status:  Discontinued        2 g 200 mL/hr over 30 Minutes Intravenous On call to O.R. 05/20/21 1601 05/20/21 0906   05/21/21 0600  cefoTEtan (CEFOTAN) 2 g in sodium chloride 0.9 % 100 mL IVPB        2 g 200 mL/hr over 30 Minutes Intravenous On call to O.R. 05/20/21 0932 05/22/21 0559        Assessment/Plan  86 yo female with anemia and lower GI bleeding secondary to ascending colon adenocarcinoma. Staging workup negative. Bowel prep completed. Proceed to OR for lap-assisted right hemicolectomy.  Procedure details discussed with the patient, informed consent obtained.     LOS: 3 days    Michaelle Birks, MD [facility] Surgery General, Hepatobiliary and Pancreatic Surgery 05/21/21 7:40 AM

## 2021-05-21 NOTE — Anesthesia Postprocedure Evaluation (Signed)
Anesthesia Post Note  Patient: Cindy Patterson  Procedure(s) Performed: LAPAROSCOPIC ASSISTED RIGHT HEMI COLECTOMY (Abdomen)     Patient location during evaluation: PACU Anesthesia Type: General Level of consciousness: awake Pain management: pain level controlled Vital Signs Assessment: post-procedure vital signs reviewed and stable Respiratory status: spontaneous breathing Cardiovascular status: stable Postop Assessment: no apparent nausea or vomiting Anesthetic complications: no   No notable events documented.  Last Vitals:  Vitals:   05/21/21 1050 05/21/21 1057  BP:    Pulse: 72 73  Resp: 13 14  Temp:    SpO2: 100% 100%    Last Pain:  Vitals:   05/21/21 1057  TempSrc:   PainSc: 6                  [name]

## 2021-05-21 NOTE — Plan of Care (Signed)

## 2021-05-22 ENCOUNTER — Encounter (HOSPITAL_COMMUNITY): Payer: Self-pay | Admitting: Surgery

## 2021-05-22 LAB — CBC
HCT: 25.4 % — ABNORMAL LOW (ref 36.0–46.0)
Hemoglobin: 8.2 g/dL — ABNORMAL LOW (ref 12.0–15.0)
MCH: 27.2 pg (ref 26.0–34.0)
MCHC: 32.3 g/dL (ref 30.0–36.0)
MCV: 84.4 fL (ref 80.0–100.0)
Platelets: 350 10*3/uL (ref 150–400)
RBC: 3.01 MIL/uL — ABNORMAL LOW (ref 3.87–5.11)
RDW: 14.6 % (ref 11.5–15.5)
WBC: 8.9 10*3/uL (ref 4.0–10.5)
nRBC: 0 % (ref 0.0–0.2)

## 2021-05-22 LAB — BASIC METABOLIC PANEL
Anion gap: 5 (ref 5–15)
BUN: 16 mg/dL (ref 8–23)
CO2: 23 mmol/L (ref 22–32)
Calcium: 8.9 mg/dL (ref 8.9–10.3)
Chloride: 104 mmol/L (ref 98–111)
Creatinine, Ser: 0.9 mg/dL (ref 0.44–1.00)
GFR, Estimated: >60 mL/min (ref >60)
Glucose, Bld: 91 mg/dL (ref 70–99)
Potassium: 4.3 mmol/L (ref 3.5–5.1)
Sodium: 132 mmol/L — ABNORMAL LOW (ref 135–145)

## 2021-05-22 MED ORDER — MORPHINE SULFATE (PF) 2 MG/ML IV SOLN: 2.0000 mg | INTRAVENOUS | Status: DC | PRN

## 2021-05-22 MED ORDER — TRAMADOL HCL 50 MG PO TABS: 50.0000 mg | ORAL_TABLET | Freq: Four times a day (QID) | ORAL | Status: DC | PRN

## 2021-05-22 NOTE — Evaluation (Signed)
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 Patient is s/p Laparoscopic-assisted right hemicolectomy.   Clinical Impression   Cindy Patterson is an 86 year old woman who presents s/p hemicolectomy with mild complaints of pain. On evaluation she demonstrated ability to perform dressing, toileting and her normal functional mobility - which presents with kyphotic posture and mild limp. Patient near her baseline. Can have intermittent assistance of family if needed and has an accessible home  environment. No OT needs at this time.     Recommendations for follow up therapy are one component of a multi-disciplinary discharge planning process, led by the attending physician.  Recommendations may be updated based on patient status, additional functional criteria and insurance authorization.   Follow Up Recommendations  No OT follow up    Equipment Recommendations  None recommended by OT    Recommendations for Other Services       Precautions / Restrictions Precautions Precautions: None      Mobility Bed Mobility Overal bed mobility: Modified Independent                  Transfers Overall transfer level: Modified independent               General transfer comment: Exhibits altered gait but patient reports this is her baseline from chronic back pain. Reports a mild limp at baseline.    Balance Overall balance assessment: Mild deficits observed, not formally tested                                         ADL either performed or assessed with clinical judgement   ADL Overall ADL's : Modified independent                                             Vision Patient Visual Report: No change from baseline       Perception      Praxis      Pertinent Vitals/Pain Pain Assessment: Faces Faces Pain Scale: Hurts a little bit Pain Location: abdomen Pain Descriptors / Indicators: Sore Pain Intervention(s): Monitored during session     Hand Dominance Right   Extremity/Trunk Assessment Upper Extremity Assessment Upper Extremity Assessment: Overall WFL for tasks assessed   Lower Extremity Assessment Lower Extremity Assessment: Overall WFL for tasks assessed   Cervical / Trunk Assessment Cervical / Trunk Assessment: Kyphotic   Communication Communication Communication: No difficulties   Cognition Arousal/Alertness: Awake/alert Behavior During Therapy: WFL for tasks assessed/performed Overall Cognitive Status: Within Functional Limits for tasks assessed                                     General Comments       Exercises     Shoulder Instructions      Home Living Family/patient expects to be discharged to:: Private residence Living Arrangements: Alone Available Help at Discharge: Family;Available PRN/intermittently Type of Home: Other(Comment) (Condo) Home Access: Stairs to enter Entrance Stairs-Number of Steps: 1  Home Layout: One level     Bathroom Shower/Tub: Occupational psychologist: Handicapped height     Home Equipment: Walker - standard;Shower seat - built in          Prior Functioning/Environment Level of Independence: Independent                 OT Problem List: Pain      OT Treatment/Interventions:      OT Goals(Current goals can be found in the care plan section) Acute Rehab OT Goals OT Goal Formulation: All assessment and education complete, DC therapy  OT Frequency:     Barriers to D/C:            Co-evaluation              AM-PAC OT "6 Clicks" Daily Activity     Outcome Measure Help from another person eating meals?: None Help from another person taking care of personal grooming?: None Help from another person  toileting, which includes using toliet, bedpan, or urinal?: None Help from another person bathing (including washing, rinsing, drying)?: None Help from another person to put on and taking off regular upper body clothing?: None Help from another person to put on and taking off regular lower body clothing?: None 6 Click Score: 24   End of Session Nurse Communication: Mobility status  Activity Tolerance: Patient tolerated treatment well Patient left: in chair;with call bell/phone within reach  OT Visit Diagnosis: Pain                Time: 8144-8185 OT Time Calculation (min): 12 min Charges:  OT General Charges $OT Visit: 1 Visit OT Evaluation $OT Eval Low Complexity: 1 Low  Bella Brummet, OTR/L [place]  Office [phone] Pager: [phone]   Lenward Chancellor 05/22/2021, 11:24 AM

## 2021-05-22 NOTE — Progress Notes (Signed)
1 Day Post-Op  Subjective: No acute complaints this morning.  Afebrile vital stable.  Labs pending.  Pain well controlled.   Objective: Vital signs in last 24 hours: Temp:  [97.5 F (36.4 C)-98 F (36.7 C)] 98 F (36.7 C) (09/24 0508) Pulse Rate:  [71-96] 89 (09/24 0508) Resp:  [10-16] 16 (09/24 0508) BP: (116-197)/(61-88) 120/61 (09/24 0508) SpO2:  [97 %-100 %] 98 % (09/24 0508) Weight:  [72.2 kg] 72.2 kg (09/24 0508) Last BM Date: 05/19/21  Intake/Output from previous day: 09/23 0701 - 09/24 0700 In: 3266.7 [P.O.:480; I.V.:2371.7; Blood:315; IV Piggyback:100] Out: 1300 [Urine:1275; Blood:25] Intake/Output this shift: No intake/output data recorded.  PE: General: resting comfortably, NAD Neuro: alert and oriented, no focal deficits Resp: normal work of breathing on room air Abdomen: soft, nondistended, nontender to palpation.  Lap incisions clean, dry, and intact with no erythema induration or drainage. Extremities: warm and well-perfused GU: Foley in place draining clear yellow urine   Lab Results:  Recent Labs    05/21/21 0516 05/21/21 1045 05/22/21 0542  WBC 4.3  --  8.9  HGB 7.4* 10.2* 8.2*  HCT 23.3* 30.0* 25.4*  PLT 311  --  350   BMET Recent Labs    05/20/21 0540 05/21/21 0516 05/21/21 1045  NA 138  --  134*  K 4.0  --  4.2  CL 111  --  102  CO2 22  --   --   GLUCOSE 97  --  149*  BUN 12  --  8  CREATININE 0.65 0.86 0.90  CALCIUM 10.1  --   --    PT/INR No results for input(s): LABPROT, INR in the last 72 hours.  CMP     Component Value Date/Time   NA 134 (L) 05/21/2021 1045   K 4.2 05/21/2021 1045   CL 102 05/21/2021 1045   CO2 22 05/20/2021 0540   GLUCOSE 149 (H) 05/21/2021 1045   BUN 8 05/21/2021 1045   CREATININE 0.90 05/21/2021 1045   CALCIUM 10.1 05/20/2021 0540   PROT 7.0 05/18/2021 1811   ALBUMIN 4.4 05/18/2021 1811   AST 20 05/18/2021 1811   ALT 14 05/18/2021 1811   ALKPHOS 57 05/18/2021 1811   BILITOT 0.9  05/18/2021 1811   GFRNONAA >60 05/21/2021 0516   Lipase  No results found for: LIPASE     Studies/Results: No results found.  Anti-infectives: Anti-infectives (From admission, onward)    Start     Dose/Rate Route Frequency Ordered Stop   05/21/21 0600  cefoTEtan (CEFOTAN) 2 g in sodium chloride 0.9 % 100 mL IVPB  Status:  Discontinued        2 g 200 mL/hr over 30 Minutes Intravenous On call to O.R. 05/20/21 1660 05/20/21 0906   05/21/21 0600  cefoTEtan (CEFOTAN) 2 g in sodium chloride 0.9 % 100 mL IVPB        2 g 200 mL/hr over 30 Minutes Intravenous On call to O.R. 05/20/21 6301 05/21/21 0842        Assessment/Plan  86 yo female with anemia and lower GI bleeding secondary to ascending colon adenocarcinoma. POD1 s/p lap-assisted right hemicolecotmy. - Advance to full liquid diet, SLIV - Ambulate TID - Multimodal pain control - PT ordered - Labs pending this morning - Remove foley catheter - VTE: lovenox, SCDs - Dispo: inpatient, med-surg floor   LOS: 4 days    Michaelle Birks, MD [facility] Surgery General, Hepatobiliary and Pancreatic Surgery 05/22/21 8:26 AM

## 2021-05-22 NOTE — Plan of Care (Signed)

## 2021-05-22 NOTE — Evaluation (Signed)
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 Patient is s/p Laparoscopic-assisted right hemicolectomy.  Clinical Impression  Pt admitted with above diagnosis.  Pt currently with functional limitations due to the deficits listed below (see PT Problem List). Pt will benefit from skilled PT to increase their independence and safety with mobility to allow discharge to the venue listed below.  Pt is close to baseline status, but is a bit more unsteady with her gait.  Recommend RW.        Recommendations for follow up therapy are one component of a multi-disciplinary discharge planning process, led by the attending physician.  Recommendations may be updated based on patient status, additional functional criteria and insurance authorization.  Follow Up Recommendations No PT follow up    Equipment Recommendations  Rolling walker with 5" wheels    Recommendations for Other Services       Precautions / Restrictions Precautions Precautions: None Restrictions Weight Bearing Restrictions: No      Mobility  Bed Mobility Overal bed mobility: Modified Independent                  Transfers Overall transfer level: Modified independent                  Ambulation/Gait Ambulation/Gait assistance: Min guard;Min assist Gait Distance (Feet): 132 Feet Assistive device: Rolling walker (2 wheeled) Gait Pattern/deviations: Decreased step length - right;Decreased step length - left Gait velocity: decreased   General Gait Details: Pt amb with limp pre-surgery, but did feel more unstable after ambulating 25 without an AD.  RW brought to her and her cadence and gait pattern was smoother and safer.  Stairs            Wheelchair Mobility    Modified Rankin (Stroke Patients Only)        Balance Overall balance assessment: Needs assistance   Sitting balance-Leahy Scale: Good       Standing balance-Leahy Scale: Fair                               Pertinent Vitals/Pain Pain Score: 2  Pain Location: abdomen Pain Descriptors / Indicators: Sore Pain Intervention(s): Premedicated before session    Home Living Family/patient expects to be discharged to:: Private residence Living Arrangements: Alone Available Help at Discharge: Family;Available PRN/intermittently Type of Home: Other(Comment) (Condo) Home Access: Stairs to enter   Entrance Stairs-Number of Steps: 1 Home Layout: One level Home Equipment: Walker - standard;Shower seat - built in      Prior Function Level of Independence: Independent               Hand Dominance   Dominant Hand: Right    Extremity/Trunk Assessment   Upper Extremity Assessment Upper Extremity Assessment: Overall WFL for tasks assessed    Lower Extremity Assessment Lower Extremity Assessment: Overall WFL for tasks assessed    Cervical / Trunk Assessment Cervical / Trunk Assessment: Kyphotic  Communication   Communication: No difficulties  Cognition Arousal/Alertness: Awake/alert Behavior During Therapy: WFL for tasks assessed/performed Overall Cognitive Status: Within Functional Limits for tasks assessed  General Comments      Exercises     Assessment/Plan    PT Assessment Patient needs continued PT services  PT Problem List Decreased mobility;Decreased balance;Decreased knowledge of use of DME       PT Treatment Interventions DME instruction;Gait training;Functional mobility training;Therapeutic activities;Therapeutic exercise;Balance training    PT Goals (Current goals can be found in the Care Plan section)  Acute Rehab PT Goals Patient Stated Goal: Go to [place] in November to meet her great grandchild PT Goal Formulation: With  patient Time For Goal Achievement: 06/05/21 Potential to Achieve Goals: Good    Frequency Min 3X/week   Barriers to discharge Decreased caregiver support      Co-evaluation               AM-PAC PT "6 Clicks" Mobility  Outcome Measure Help needed turning from your back to your side while in a flat bed without using bedrails?: None Help needed moving from lying on your back to sitting on the side of a flat bed without using bedrails?: None Help needed moving to and from a bed to a chair (including a wheelchair)?: A Little Help needed standing up from a chair using your arms (e.g., wheelchair or bedside chair)?: A Little Help needed to walk in hospital room?: A Little Help needed climbing 3-5 steps with a railing? : A Little 6 Click Score: 20    End of Session Equipment Utilized During Treatment: Gait belt Activity Tolerance: Patient tolerated treatment well Patient left: in chair;with call bell/phone within reach Nurse Communication: Mobility status PT Visit Diagnosis: Difficulty in walking, not elsewhere classified (R26.2)    Time: 9417-4081 PT Time Calculation (min) (ACUTE ONLY): 14 min   Charges:   PT Evaluation $PT Eval Low Complexity: 1 Low          Aerion Bagdasarian L. Tamala Julian, [place]  05/22/2021   Galen Manila 05/22/2021, 5:04 PM

## 2021-05-23 MED ORDER — FUROSEMIDE 20 MG PO TABS
20.0000 mg | ORAL_TABLET | Freq: Every day | ORAL | Status: DC
  Administered 2021-05-23 – 2021-05-24 (×2): 20 mg via ORAL
  Filled 2021-05-23 (×2): qty 1

## 2021-05-23 NOTE — Plan of Care (Signed)
  Problem: Education: Goal: Ability to identify signs and symptoms of gastrointestinal bleeding will improve Outcome: Progressing   Problem: Bowel/Gastric: Goal: Will show no signs and symptoms of gastrointestinal bleeding Outcome: Progressing   Problem: Fluid Volume: Goal: Will show no signs and symptoms of excessive bleeding Outcome: Completed/Met   Problem: Clinical Measurements: Goal: Complications related to the disease process, condition or treatment will be avoided or minimized Outcome: Progressing   Problem: Education: Goal: Knowledge of General Education information will improve Description: Including pain rating scale, medication(s)/side effects and non-pharmacologic comfort measures Outcome: Progressing   Problem: Health Behavior/Discharge Planning: Goal: Ability to manage health-related needs will improve Outcome: Progressing   Problem: Clinical Measurements: Goal: Ability to maintain clinical measurements within normal limits will improve Outcome: Progressing Goal: Will remain free from infection Outcome: Progressing Goal: Diagnostic test results will improve Outcome: Progressing Goal: Respiratory complications will improve Outcome: Progressing Goal: Cardiovascular complication will be avoided Outcome: Progressing   Problem: Activity: Goal: Risk for activity intolerance will decrease Outcome: Progressing

## 2021-05-23 NOTE — Progress Notes (Signed)
    2 Days Post-Op  Subjective: No acute complaints.  Vital stable.  Pain well controlled.  Had bowel movements yesterday.  Tolerating full liquids.   Objective: Vital signs in last 24 hours: Temp:  [98.7 F (37.1 C)-99.5 F (37.5 C)] 98.9 F (37.2 C) (09/25 0511) Pulse Rate:  [74-91] 74 (09/25 0511) Resp:  [17-18] 17 (09/25 0511) BP: (135-149)/(61-76) 135/61 (09/25 0511) SpO2:  [93 %-95 %] 93 % (09/25 0511) Weight:  [69.7 kg] 69.7 kg (09/25 0521) Last BM Date: 05/22/21  Intake/Output from previous day: 09/24 0701 - 09/25 0700 In: 558.7 [P.O.:360; I.V.:198.7] Out: 650 [Urine:650] Intake/Output this shift: No intake/output data recorded.  PE: General: resting comfortably, NAD Neuro: alert and oriented, no focal deficits Resp: normal work of breathing on room air Abdomen: soft, nondistended, nontender to palpation.  Lap incisions clean, dry, and intact with no erythema induration or drainage. Extremities: warm and well-perfused   Lab Results:  Recent Labs    05/21/21 0516 05/21/21 1045 05/22/21 0542  WBC 4.3  --  8.9  HGB 7.4* 10.2* 8.2*  HCT 23.3* 30.0* 25.4*  PLT 311  --  350   BMET Recent Labs    05/21/21 1045 05/22/21 0542  NA 134* 132*  K 4.2 4.3  CL 102 104  CO2  --  23  GLUCOSE 149* 91  BUN 8 16  CREATININE 0.90 0.90  CALCIUM  --  8.9   PT/INR No results for input(s): LABPROT, INR in the last 72 hours.  CMP     Component Value Date/Time   NA 132 (L) 05/22/2021 0542   K 4.3 05/22/2021 0542   CL 104 05/22/2021 0542   CO2 23 05/22/2021 0542   GLUCOSE 91 05/22/2021 0542   BUN 16 05/22/2021 0542   CREATININE 0.90 05/22/2021 0542   CALCIUM 8.9 05/22/2021 0542   PROT 7.0 05/18/2021 1811   ALBUMIN 4.4 05/18/2021 1811   AST 20 05/18/2021 1811   ALT 14 05/18/2021 1811   ALKPHOS 57 05/18/2021 1811   BILITOT 0.9 05/18/2021 1811   GFRNONAA >60 05/22/2021 0542   Lipase  No results found for: LIPASE     Studies/Results: No results  found.    Assessment/Plan  86 yo female with anemia and lower GI bleeding secondary to ascending colon adenocarcinoma. POD2 s/p lap-assisted right hemicolecotmy. - Advance to soft diet - Ambulate TID - Multimodal pain control - PT following, recommending rolling walker at discharge - Surgical pathology pending, will need outpatient follow-up with medical oncology. - VTE: lovenox, SCDs - Dispo: inpatient, med-surg floor.  Anticipate discharge home tomorrow.   LOS: 5 days    Michaelle Birks, MD [facility] Surgery General, Hepatobiliary and Pancreatic Surgery 05/23/21 8:03 AM

## 2021-05-23 NOTE — Progress Notes (Signed)
Pharmacy Brief Note - Alvimopan (Entereg)  The standing order set for alvimopan (Entereg) now includes an automatic order to discontinue the drug after the patient has had a bowel movement. The change was approved by the [place] and the Medical Executive Committee.   This patient has had bowel movements documented by nursing. Therefore, alvimopan has been discontinued. If there are questions, please contact the pharmacy at [phone].   Thank you-   Royetta Asal, PharmD, BCPS 05/23/2021 11:32 AM

## 2021-05-23 NOTE — Plan of Care (Signed)

## 2021-05-24 LAB — BPAM RBC
Blood Product Expiration Date: 202210172359
Blood Product Expiration Date: 202210262359
ISSUE DATE / TIME: 202209230736
ISSUE DATE / TIME: 202209230736
Unit Type and Rh: 5100
Unit Type and Rh: 5100

## 2021-05-24 LAB — TYPE AND SCREEN
ABO/RH(D): O POS
Antibody Screen: NEGATIVE
Unit division: 0
Unit division: 0

## 2021-05-24 LAB — SURGICAL PATHOLOGY

## 2021-05-24 MED ORDER — TRAMADOL HCL 50 MG PO TABS: 50.0000 mg | ORAL_TABLET | Freq: Four times a day (QID) | ORAL | 0 refills | Status: AC | PRN

## 2021-05-24 NOTE — TOC Transition Note (Signed)
Transition of Care [facility]) - CM/SW Discharge Note   Patient Details  Name: Cindy Patterson MRN: [id number] Date of Birth: 1934-12-03  [facility] CM/SW Contact:  Lynnell Catalan, RN Phone Number: 05/24/2021, 10:14 AM   Clinical Narrative:     Spoke with pt at bedside for dc planning. Rolling walker order placed by MD. Pt declines RW at this time. She states that she has a walker at home already and she is planning on getting a motorized scooter soon.

## 2021-05-24 NOTE — Discharge Summary (Signed)
Patient ID: Cindy Patterson [id number] [birth year] 86 y.o.  Admit date: 05/18/2021 Discharge date: 05/24/2021  Admitting Diagnosis: GI bleed HTN  Discharge Diagnosis Patient Active Problem List   Diagnosis Date Noted   Acute GI bleeding 05/18/2021   Acute blood loss anemia 05/18/2021  R colon adenocarcinoma, s/p lap assisted R hemicolectomy, Dr. Zenia Resides  Consultants Dr. Zenia Resides, general surgery Dr. Benson Norway, GI  Reason for Admission: [name] is a 86 year old female, widowed (spouse was a Psychiatric nurse), retired Scientist, research (life sciences), lives alone and dependent, medical history significant for essential hypertension, hyperlipidemia, GERD, diverticulosis by colonoscopy approximately 10 years ago, osteoarthritis, multiple surgical procedures as noted below, presented to the [facility] as a direct admission from Dr. Collene Mares, GI office with above complaints.  Patient reports that she was in her usual state of health until 05/14/2021 when she had an episode of dark/maroon stools with clots associated with abdominal cramping.  She denies ever having epigastric pain, nausea or vomiting.  She had multiple BMs on 05/15/2021 with low volume dark stools and no clots.  Ongoing abdominal cramping prior to BMs.  She denied any blood in stools on 05/16/2021.  After returning from swimming exercises on 05/17/2021, she had another episode of more formed but dark stools following which she went to see her PCP today, lab works revealed hemoglobin of 7.7.  PCP referred her to see her gastroenterologist and patient was directed to the hospital for admission and evaluation.  She denied prior history of GI bleeds.  Uses Advil only occasionally and denies being on blood thinners.  She reports generalized weakness/tiredness but denies chest pain, dyspnea, palpitations, passing out or feeling like passing out.  Procedures Upper endo and colonoscopy, 9/21, Dr. Collene Mares  Laparoscopic-assisted right hemicolectomy, Dr.  Zenia Resides 9/23  Hospital Course:  The patient was admitted and underwent work up for her GI bleed.  She was found to have a right ascending colon mass.  General surgery was consulted and the patient underwent the above procedure.  She tolerated this well and her diet was able to be advanced as tolerated post op.  She was having bowel function prior to discharge. She worked with therapies who did not recommend any follow up.  Her colonoscopy path revealed adenocarcinoma.  Her surgical path had not returned at time of discharge.  She was otherwise doing well on POD 3, and stable for DC home.  Physical Exam: Abd: soft, appropriately tender, +BS, ND, incisions c/d/i  Allergies as of 05/24/2021       Reactions   Codeine    Sulfa Antibiotics         Medication List     TAKE these medications    acetaminophen 500 MG tablet Commonly known as: TYLENOL Take 1,000 mg by mouth every 6 (six) hours as needed for mild pain or moderate pain.   atorvastatin 10 MG tablet Commonly known as: LIPITOR Take 10 mg by mouth at bedtime.   furosemide 20 MG tablet Commonly known as: LASIX Take 20 mg by mouth daily. Take with banana or OJ   lisinopril 40 MG tablet Commonly known as: ZESTRIL Take 80 mg by mouth daily.   omeprazole 20 MG capsule Commonly known as: PRILOSEC Take 20 mg by mouth daily.   pyridOXINE 100 MG tablet Commonly known as: VITAMIN B-6 Take 100 mg by mouth daily.   tiZANidine 2 MG tablet Commonly known as: ZANAFLEX Take 2 mg by mouth 3 (three) times daily as needed.  traMADol 50 MG tablet Commonly known as: ULTRAM Take 1 tablet (50 mg total) by mouth every 6 (six) hours as needed. What changed: reasons to take this   vitamin C 1000 MG tablet Take 1 capsule by mouth daily.   Vitamin D3 10 MCG (400 UNIT) Caps Take 10 mcg by mouth at bedtime.               Durable Medical Equipment  (From admission, onward)           Start     Ordered   05/23/21 0803  For  home use only DME Walker rolling  Once       Question Answer Comment  Walker: With 5 Inch Wheels   Patient needs a walker to treat with the following condition Colon adenocarcinoma ([place])      05/23/21 0802              Follow-up Information     Dwan Bolt, MD Follow up in 3 week(s).   Specialty: General Surgery Why: call to confirm appointment date and time Contact information: [place]. [address] [place] [address] [phone]                 Signed: Saverio Danker, [facility] 05/24/2021, 8:55 AM Please see Amion for pager number during day hours 7:00am-4:30pm, 7-11:30am on Weekends

## 2021-05-24 NOTE — Discharge Instructions (Signed)
[facility]      [facility] [phone]  OPEN ABDOMINAL SURGERY: POST OP INSTRUCTIONS  Always review your discharge instruction sheet given to you by the facility where your surgery was performed.  IF YOU HAVE DISABILITY OR FAMILY LEAVE FORMS, YOU MUST BRING THEM TO THE OFFICE FOR PROCESSING.  PLEASE DO NOT GIVE THEM TO YOUR DOCTOR.  A prescription for pain medication may be given to you upon discharge.  Take your pain medication as prescribed, if needed.  If narcotic pain medicine is not needed, then you may take acetaminophen (Tylenol) or ibuprofen (Advil) as needed. Take your usually prescribed medications unless otherwise directed. If you need a refill on your pain medication, please contact your pharmacy. They will contact our office to request authorization.  Prescriptions will not be filled after 5pm or on week-ends. You should follow a light diet the first few days after arrival home, such as soup and crackers, pudding, etc.unless your doctor has advised otherwise. A high-fiber, low fat diet can be resumed as tolerated.   Be sure to include lots of fluids daily. Most patients will experience some swelling and bruising on the chest and neck area.  Ice packs will help.  Swelling and bruising can take several days to resolve Most patients will experience some swelling and bruising in the area of the incision. Ice pack will help. Swelling and bruising can take several days to resolve..  It is common to experience some constipation if taking pain medication after surgery.  Increasing fluid intake and taking a stool softener will usually help or prevent this problem from occurring.  A mild laxative (Milk of Magnesia or Miralax) should be taken according to package directions if there are no bowel movements after 48 hours.  You may have steri-strips (small skin tapes) in place directly over the incision.  These strips should be left on the skin for 7-10 days.  If your surgeon used skin  glue on the incision, you may shower in 24 hours.  The glue will flake off over the next 2-3 weeks.  Any sutures or staples will be removed at the office during your follow-up visit. You may find that a light gauze bandage over your incision may keep your staples from being rubbed or pulled. You may shower and replace the bandage daily. ACTIVITIES:  You may resume regular (light) daily activities beginning the next day--such as daily self-care, walking, climbing stairs--gradually increasing activities as tolerated.  You may have sexual intercourse when it is comfortable.  Refrain from any heavy lifting or straining until approved by your doctor. You may drive when you no longer are taking prescription pain medication, you can comfortably wear a seatbelt, and you can safely maneuver your car and apply brakes Return to Work: ___________________________________ You should see your doctor in the office for a follow-up appointment approximately two weeks after your surgery.  Make sure that you call for this appointment within a day or two after you arrive home to insure a convenient appointment time. OTHER INSTRUCTIONS:  _____________________________________________________________ _____________________________________________________________  WHEN TO CALL YOUR DOCTOR: Fever over 101.0 Inability to urinate Nausea and/or vomiting Extreme swelling or bruising Continued bleeding from incision. Increased pain, redness, or drainage from the incision. Difficulty swallowing or breathing Muscle cramping or spasms. Numbness or tingling in hands or feet or around lips.  The clinic staff is available to answer your questions during regular business hours.  Please don't hesitate to call and ask to speak to one of   the nurses if you have concerns.  For further questions, please visit [email or web address]  

## 2021-05-24 NOTE — Care Management Important Message (Signed)
Important Message  Patient Details IM Letter given to the Patient. Name: Cindy Patterson MRN: [id number] Date of Birth: December 26, 1934   Medicare Important Message Given:  Yes     Kerin Salen 05/24/2021, 12:44 PM

## 2021-05-24 NOTE — Progress Notes (Signed)
Patient had uneventful night. She was up to the bathroom at least twice last night. Some soreness to the abdomen noted with mobility otherwise she had not issues.

## 2021-05-24 NOTE — Progress Notes (Signed)
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 Patient is s/p Laparoscopic-assisted right hemicolectomy.    PT Comments    Patient progressing well with acute therapy and increased ambulation distance to ~400'. Pt initially ambulated without walker however unstable with no device and transitioned to gait with RW. Pt required reduced assist with walker and min guard only for safety. Patient completed stair mobility with single rail and no overt LOB; min guard for safety. Anticipate pt will continue to progress mobility well with no further needs when discharging home. Acute PT will continue to progress pt as able in hospital setting.    Recommendations for follow up therapy are one component of a multi-disciplinary discharge planning process, led by the attending physician.  Recommendations may be updated based on patient status, additional functional criteria and insurance authorization.    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 Patient is s/p Laparoscopic-assisted right hemicolectomy.  Subjective Data  Patient Stated Goal Go to [place] in November to meet her great grandchild  Precautions  Precautions None  Restrictions  Weight Bearing Restrictions No  Pain Assessment  Pain Assessment No/denies pain  Pain Intervention(s) Monitored during session  Cognition  Arousal/Alertness Awake/alert  Behavior During Therapy [facility] for tasks assessed/performed  Overall Cognitive Status Within Functional Limits for tasks assessed  Bed Mobility  Overal bed mobility Modified Independent  Transfers  Overall transfer level Modified  independent  General transfer comment pt steady with sit<>stand form reclienr and toilet, using UE's for power up and safe reach back to lower.  Ambulation/Gait  Ambulation/Gait assistance Min guard;Supervision  Gait Distance (Feet) 400 Feet  Assistive device None  Gait Pattern/deviations Decreased step length - right;Decreased step length - left;Step-through pattern;Decreased stride length  General Gait Details ambulated ~120' with no device and pt unsteady with hitch/limp (pt reports this is baseline). gait improved with use of RW for stability. min guard/supervision for safety. pt required seated rest at ~300' after stair mobility.  Gait velocity decreased  Stairs Yes  Stairs assistance Min guard  Stair Management One rail Right;Step to pattern;Forwards;Sideways  Number of Stairs 4  General stair comments pt ascend/descend with single rail amd min guard for safety. pt uses wall of house at her daughters to steady self and daugther guards.  Balance  Overall balance assessment Needs assistance  Sitting balance-Leahy Scale Good  Standing balance support During functional activity;No upper extremity supported  Standing balance-Leahy Scale Good  PT - End of Session  Equipment Utilized During Treatment Gait belt  Activity Tolerance Patient tolerated treatment well  Patient left in bed;with call bell/phone within reach  Nurse Communication Mobility status   PT - Assessment/Plan  PT Plan Current plan remains appropriate  PT Visit Diagnosis Difficulty in walking, not elsewhere classified (R26.2)  PT Frequency (ACUTE ONLY) Min 3X/week  Follow Up Recommendations No PT follow up  PT equipment  (Rollator)  AM-PAC PT "6 Clicks" Mobility Outcome Measure (Version 2)  Help needed turning from your back to your side while in a flat bed without using bedrails? 4  Help needed moving from lying on  your back to sitting on the side of a flat bed without using bedrails? 4  Help needed moving to and from  a bed to a chair (including a wheelchair)? 3  Help needed standing up from a chair using your arms (e.g., wheelchair or bedside chair)? 3  Help needed to walk in hospital room? 3  Help needed climbing 3-5 steps with a railing?  3  6 Click Score 20  Consider Recommendation of Discharge To: Home with no services  Progressive Mobility  What is the highest level of mobility based on the progressive mobility assessment? Level 4 (Walks with assist in room) - Balance while marching in place and cannot step forward and back - Complete  Mobility Ambulated with assistance in hallway  PT Goal Progression  Progress towards PT goals Progressing toward goals  Acute Rehab PT Goals  PT Goal Formulation With patient  Time For Goal Achievement 06/05/21  Potential to Achieve Goals Good  PT Time Calculation  PT Start Time (ACUTE ONLY) 1053  PT Stop Time (ACUTE ONLY) 1117  PT Time Calculation (min) (ACUTE ONLY) 24 min  PT General Charges  $$ ACUTE PT VISIT 1 Visit  PT Treatments  $Gait Training 23-37 mins    Verner Mould, DPT Acute Rehabilitation Services Office [phone] Pager [phone]     Jacques Navy 05/24/2021, 5:11 PM

## 2021-05-25 ENCOUNTER — Telehealth: Payer: Self-pay | Admitting: Nurse Practitioner

## 2021-05-25 NOTE — Telephone Encounter (Signed)
Scheduled appt per 9/25 referral. Pt is aware of appt date and time.

## 2021-05-27 DIAGNOSIS — K5730 Diverticulosis of large intestine without perforation or abscess without bleeding: Secondary | ICD-10-CM | POA: Diagnosis not present

## 2021-05-27 DIAGNOSIS — R197 Diarrhea, unspecified: Secondary | ICD-10-CM | POA: Diagnosis not present

## 2021-05-27 DIAGNOSIS — Z85038 Personal history of other malignant neoplasm of large intestine: Secondary | ICD-10-CM | POA: Diagnosis not present

## 2021-05-27 DIAGNOSIS — Z86010 Personal history of colonic polyps: Secondary | ICD-10-CM | POA: Diagnosis not present

## 2021-06-07 ENCOUNTER — Other Ambulatory Visit: Payer: Self-pay

## 2021-06-07 ENCOUNTER — Encounter: Payer: Self-pay | Admitting: Nurse Practitioner

## 2021-06-07 ENCOUNTER — Emergency Department (HOSPITAL_COMMUNITY): Payer: Medicare Other

## 2021-06-07 ENCOUNTER — Encounter (HOSPITAL_COMMUNITY): Payer: Self-pay | Admitting: Emergency Medicine

## 2021-06-07 ENCOUNTER — Inpatient Hospital Stay: Payer: Medicare Other | Attending: Nurse Practitioner | Admitting: Nurse Practitioner

## 2021-06-07 ENCOUNTER — Observation Stay (HOSPITAL_COMMUNITY)
Admission: EM | Admit: 2021-06-07 | Discharge: 2021-06-08 | Disposition: A | Discharge status: Home / Self Care | Payer: Medicare Other | Attending: Emergency Medicine | Admitting: Emergency Medicine

## 2021-06-07 ENCOUNTER — Observation Stay (HOSPITAL_COMMUNITY): Payer: Medicare Other

## 2021-06-07 DIAGNOSIS — D500 Iron deficiency anemia secondary to blood loss (chronic): Secondary | ICD-10-CM | POA: Diagnosis present

## 2021-06-07 DIAGNOSIS — Z8249 Family history of ischemic heart disease and other diseases of the circulatory system: Secondary | ICD-10-CM | POA: Insufficient documentation

## 2021-06-07 DIAGNOSIS — K219 Gastro-esophageal reflux disease without esophagitis: Secondary | ICD-10-CM | POA: Insufficient documentation

## 2021-06-07 DIAGNOSIS — Z7982 Long term (current) use of aspirin: Secondary | ICD-10-CM | POA: Diagnosis not present

## 2021-06-07 DIAGNOSIS — Z885 Allergy status to narcotic agent status: Secondary | ICD-10-CM | POA: Insufficient documentation

## 2021-06-07 DIAGNOSIS — Z79899 Other long term (current) drug therapy: Secondary | ICD-10-CM | POA: Diagnosis not present

## 2021-06-07 DIAGNOSIS — Z85038 Personal history of other malignant neoplasm of large intestine: Secondary | ICD-10-CM | POA: Diagnosis not present

## 2021-06-07 DIAGNOSIS — Z882 Allergy status to sulfonamides status: Secondary | ICD-10-CM | POA: Diagnosis not present

## 2021-06-07 DIAGNOSIS — I10 Essential (primary) hypertension: Secondary | ICD-10-CM | POA: Diagnosis not present

## 2021-06-07 DIAGNOSIS — Z87891 Personal history of nicotine dependence: Secondary | ICD-10-CM | POA: Diagnosis not present

## 2021-06-07 DIAGNOSIS — G319 Degenerative disease of nervous system, unspecified: Secondary | ICD-10-CM | POA: Diagnosis not present

## 2021-06-07 DIAGNOSIS — C182 Malignant neoplasm of ascending colon: Secondary | ICD-10-CM | POA: Diagnosis not present

## 2021-06-07 DIAGNOSIS — E785 Hyperlipidemia, unspecified: Secondary | ICD-10-CM | POA: Diagnosis present

## 2021-06-07 DIAGNOSIS — I959 Hypotension, unspecified: Secondary | ICD-10-CM

## 2021-06-07 DIAGNOSIS — I2510 Atherosclerotic heart disease of native coronary artery without angina pectoris: Secondary | ICD-10-CM | POA: Insufficient documentation

## 2021-06-07 DIAGNOSIS — Z20822 Contact with and (suspected) exposure to covid-19: Secondary | ICD-10-CM | POA: Insufficient documentation

## 2021-06-07 DIAGNOSIS — R55 Syncope and collapse: Secondary | ICD-10-CM | POA: Diagnosis not present

## 2021-06-07 DIAGNOSIS — I6381 Other cerebral infarction due to occlusion or stenosis of small artery: Secondary | ICD-10-CM | POA: Diagnosis not present

## 2021-06-07 DIAGNOSIS — R42 Dizziness and giddiness: Secondary | ICD-10-CM | POA: Diagnosis not present

## 2021-06-07 DIAGNOSIS — D649 Anemia, unspecified: Secondary | ICD-10-CM | POA: Insufficient documentation

## 2021-06-07 DIAGNOSIS — R030 Elevated blood-pressure reading, without diagnosis of hypertension: Secondary | ICD-10-CM | POA: Diagnosis not present

## 2021-06-07 DIAGNOSIS — Z96653 Presence of artificial knee joint, bilateral: Secondary | ICD-10-CM | POA: Diagnosis not present

## 2021-06-07 DIAGNOSIS — E441 Mild protein-calorie malnutrition: Secondary | ICD-10-CM | POA: Diagnosis present

## 2021-06-07 LAB — RESP PANEL BY RT-PCR (FLU A&B, COVID) ARPGX2
Influenza A by PCR: NEGATIVE
Influenza B by PCR: NEGATIVE
SARS Coronavirus 2 by RT PCR: NEGATIVE

## 2021-06-07 LAB — CBC WITH DIFFERENTIAL/PLATELET
Abs Immature Granulocytes: 0.07 10*3/uL (ref 0.00–0.07)
Basophils Absolute: 0 10*3/uL (ref 0.0–0.1)
Basophils Relative: 0 %
Eosinophils Absolute: 0.1 10*3/uL (ref 0.0–0.5)
Eosinophils Relative: 1 %
HCT: 25.5 % — ABNORMAL LOW (ref 36.0–46.0)
Hemoglobin: 7.9 g/dL — ABNORMAL LOW (ref 12.0–15.0)
Immature Granulocytes: 1 %
Lymphocytes Relative: 8 %
Lymphs Abs: 0.9 10*3/uL (ref 0.7–4.0)
MCH: 27.1 pg (ref 26.0–34.0)
MCHC: 31 g/dL (ref 30.0–36.0)
MCV: 87.6 fL (ref 80.0–100.0)
Monocytes Absolute: 0.7 10*3/uL (ref 0.1–1.0)
Monocytes Relative: 7 %
Neutro Abs: 8.6 10*3/uL — ABNORMAL HIGH (ref 1.7–7.7)
Neutrophils Relative %: 83 %
Platelets: 326 10*3/uL (ref 150–400)
RBC: 2.91 MIL/uL — ABNORMAL LOW (ref 3.87–5.11)
RDW: 15.4 % (ref 11.5–15.5)
WBC: 10.3 10*3/uL (ref 4.0–10.5)
nRBC: 0 % (ref 0.0–0.2)

## 2021-06-07 LAB — COMPREHENSIVE METABOLIC PANEL
ALT: 38 U/L (ref 0–44)
AST: 16 U/L (ref 15–41)
Albumin: 3.4 g/dL — ABNORMAL LOW (ref 3.5–5.0)
Alkaline Phosphatase: 53 U/L (ref 38–126)
Anion gap: 8 (ref 5–15)
BUN: 29 mg/dL — ABNORMAL HIGH (ref 8–23)
CO2: 20 mmol/L — ABNORMAL LOW (ref 22–32)
Calcium: 8.8 mg/dL — ABNORMAL LOW (ref 8.9–10.3)
Chloride: 107 mmol/L (ref 98–111)
Creatinine, Ser: 1.04 mg/dL — ABNORMAL HIGH (ref 0.44–1.00)
GFR, Estimated: 53 mL/min — ABNORMAL LOW (ref >60)
Glucose, Bld: 101 mg/dL — ABNORMAL HIGH (ref 70–99)
Potassium: 3.9 mmol/L (ref 3.5–5.1)
Sodium: 135 mmol/L (ref 135–145)
Total Bilirubin: 0.8 mg/dL (ref 0.3–1.2)
Total Protein: 6.4 g/dL — ABNORMAL LOW (ref 6.5–8.1)

## 2021-06-07 LAB — TROPONIN I (HIGH SENSITIVITY)
Troponin I (High Sensitivity): 7 ng/L (ref <18)
Troponin I (High Sensitivity): 7 ng/L (ref <18)

## 2021-06-07 LAB — TYPE AND SCREEN
ABO/RH(D): O POS
Antibody Screen: NEGATIVE

## 2021-06-07 LAB — POC OCCULT BLOOD, ED: Fecal Occult Bld: NEGATIVE

## 2021-06-07 MED ORDER — ZINC SULFATE 220 (50 ZN) MG PO CAPS
220.0000 mg | ORAL_CAPSULE | Freq: Every day | ORAL | Status: DC
  Administered 2021-06-08: 220 mg via ORAL
  Filled 2021-06-07: qty 1

## 2021-06-07 MED ORDER — PANTOPRAZOLE SODIUM 40 MG PO TBEC
40.0000 mg | DELAYED_RELEASE_TABLET | Freq: Every day | ORAL | Status: DC
  Administered 2021-06-08: 40 mg via ORAL
  Filled 2021-06-07: qty 1

## 2021-06-07 MED ORDER — TRAMADOL HCL 50 MG PO TABS: 50.0000 mg | ORAL_TABLET | Freq: Four times a day (QID) | ORAL | Status: DC | PRN

## 2021-06-07 MED ORDER — HYDRALAZINE HCL 25 MG PO TABS
25.0000 mg | ORAL_TABLET | Freq: Four times a day (QID) | ORAL | Status: DC | PRN
  Administered 2021-06-07 – 2021-06-08 (×2): 25 mg via ORAL
  Filled 2021-06-07 (×2): qty 1

## 2021-06-07 MED ORDER — LISINOPRIL 20 MG PO TABS
40.0000 mg | ORAL_TABLET | Freq: Every day | ORAL | Status: DC
  Administered 2021-06-08: 40 mg via ORAL
  Filled 2021-06-07: qty 2

## 2021-06-07 MED ORDER — ACETAMINOPHEN 325 MG PO TABS: 650.0000 mg | ORAL_TABLET | Freq: Four times a day (QID) | ORAL | Status: DC | PRN

## 2021-06-07 MED ORDER — ASCORBIC ACID 500 MG PO TABS
1000.0000 mg | ORAL_TABLET | Freq: Every day | ORAL | Status: DC
  Administered 2021-06-08: 1000 mg via ORAL
  Filled 2021-06-07: qty 2

## 2021-06-07 MED ORDER — ATORVASTATIN CALCIUM 10 MG PO TABS
10.0000 mg | ORAL_TABLET | Freq: Every day | ORAL | Status: DC
  Administered 2021-06-08: 10 mg via ORAL
  Filled 2021-06-07: qty 1

## 2021-06-07 MED ORDER — ENOXAPARIN SODIUM 40 MG/0.4ML IJ SOSY
40.0000 mg | PREFILLED_SYRINGE | Freq: Every day | INTRAMUSCULAR | Status: DC
  Administered 2021-06-07 – 2021-06-08 (×2): 40 mg via SUBCUTANEOUS
  Filled 2021-06-07 (×2): qty 0.4

## 2021-06-07 MED ORDER — SODIUM CHLORIDE 0.9% FLUSH
3.0000 mL | Freq: Two times a day (BID) | INTRAVENOUS | Status: DC
  Administered 2021-06-07 – 2021-06-08 (×2): 3 mL via INTRAVENOUS

## 2021-06-07 MED ORDER — ACETAMINOPHEN 650 MG RE SUPP: 650.0000 mg | Freq: Four times a day (QID) | RECTAL | Status: DC | PRN

## 2021-06-07 MED ORDER — VITAMIN B-6 100 MG PO TABS
100.0000 mg | ORAL_TABLET | Freq: Every day | ORAL | Status: DC
  Administered 2021-06-07 – 2021-06-08 (×2): 100 mg via ORAL
  Filled 2021-06-07 (×2): qty 1

## 2021-06-07 MED ORDER — ONDANSETRON HCL 4 MG/2ML IJ SOLN: 4.0000 mg | Freq: Four times a day (QID) | INTRAMUSCULAR | Status: DC | PRN

## 2021-06-07 MED ORDER — TIZANIDINE HCL 4 MG PO TABS: 2.0000 mg | ORAL_TABLET | Freq: Three times a day (TID) | ORAL | Status: DC | PRN

## 2021-06-07 MED ORDER — ONDANSETRON HCL 4 MG PO TABS: 4.0000 mg | ORAL_TABLET | Freq: Four times a day (QID) | ORAL | Status: DC | PRN

## 2021-06-07 NOTE — Significant Event (Signed)
Rapid Response Event Note   Reason for Call :  Hypotensive   Initial Focused Assessment:  Patient found to be lethargic on assessment, a/o x4. Family reports increased weakness with lightheadedness over the weekend.    Interventions:  IV started  1 L bolus of NS Admit to ED    Plan of Care:  Transferred to ED via w/c   Event Summary:   MD Notified:  Kalman Shan, NP  Call Time: [place] Time: [place] Time: [place], RN

## 2021-06-07 NOTE — H&P (Signed)
History and Physical    TORIE TOWLE GYK:[id number] DOB: 01/17/1935 DOA: 06/07/2021  PCP: Merrilee Seashore, MD   Patient coming from: Home.   I have personally briefly reviewed patient's old medical records in [place]  Chief Complaint: Syncopal episode.  HPI: Cindy Patterson is a 86 y.o. female with medical history significant of GERD, chronic back pain, CAD, diverticulosis, essential hypertension, GERD, hyperlipidemia, hypertension, osteoarthritis, umbilical hernia who was sitting in the waiting room at the cancer center, stood up upon hearing her name and immediately fell and lost consciousness.  She was assisted by her 2 daughters.  She denied dizziness, palpitations, chest pain, diaphoresis, nausea or emesis, dyspnea, recent PND, orthopnea or pitting edema of the lower extremities.  She woke up after about a minute but today feeling "groggy" for about 10 minutes after passing out.  She stated that she was unable to see well but after a few minutes her vision improved.  She took her lisinopril about an hour and 30 minutes before passing out.  She normally takes 80 mg daily but only took 40 mg today.  She denied abdominal pain, diarrhea, constipation melena or hematochezia.  No dysuria, frequency or hematuria.  No polyuria, polydipsia, polyphagia or blurred vision.  ED Course: Initial vital signs were temperature 97.8 F, pulse 99, respiration 20, BP 60/32 mmHg and O2 sat 89% on room air.  No medications were given in the emergency department.  Patient's blood pressure subsequently has become very elevated in the 180s and 190s.  Lab work: CBC is her white count of 10.3 with 83% neutrophils and 8% lymphocytes, hemoglobin 7.9 g/dL and platelets 326.  Her most recent hemoglobin level was 8.2 g/dL about 16 days ago.  Troponin x2 was normal.  Fecal occult blood was negative.  CMP showed a CO2 of 20 mmol/L, but all other electrolytes were normal when calcium was corrected to albumin.   Glucose 101, BUN 29 creatinine 1.04 mg/dL.  Total protein 6.4 and albumin 3.4 g/dL.  Imaging: A 2 view chest radiograph showed chronic appearing increased lung markings without evidence of acute or active cardiopulmonary disease.  CT head without contrast showed generalized atrophy without any acute intracranial abnormality.  There were bilateral subcentimeter basal ganglia lacunar infarcts.  Mild to moderate severity bilateral ethmoid sinus disease.  Please see images and full radiology report for further details.  Review of Systems: As per HPI otherwise all other systems reviewed and are negative.  Past Medical History:  Diagnosis Date   Acid reflux    Back pain    Coronary artery disease    Diverticulosis    Essential hypertension    GERD (gastroesophageal reflux disease)    Hyperlipidemia    Hypertension    Osteoarthritis    Umbilical hernia    Past Surgical History:  Procedure Laterality Date   ABDOMINAL HYSTERECTOMY     total vaginal   ABDOMINOPLASTY     APPENDECTOMY     BIOPSY  05/19/2021   Procedure: BIOPSY;  Surgeon: [name], MD;  Location: [facility];  Service: Endoscopy;;   COLONOSCOPY WITH PROPOFOL N/A 05/19/2021   Procedure: COLONOSCOPY WITH PROPOFOL;  Surgeon: [name], MD;  Location: [facility];  Service: Endoscopy;  Laterality: N/A;   DECOMPRESSIVE LUMBAR LAMINECTOMY LEVEL 1     DECOMPRESSIVE LUMBAR LAMINECTOMY LEVEL 4     ESOPHAGOGASTRODUODENOSCOPY (EGD) WITH PROPOFOL N/A 05/19/2021   Procedure: ESOPHAGOGASTRODUODENOSCOPY (EGD) WITH PROPOFOL;  Surgeon: [name], MD;  Location: [facility];  Service: Endoscopy;  Laterality: N/A;   HERNIA REPAIR     umb hernia repair   JOINT REPLACEMENT     LAPAROSCOPIC RIGHT HEMI COLECTOMY N/A 05/21/2021   Procedure: LAPAROSCOPIC ASSISTED RIGHT HEMI COLECTOMY;  Surgeon: Dwan Bolt, MD;  Location: [facility];  Service: General;  Laterality: N/A;   REPLACEMENT TOTAL KNEE BILATERAL     RHINOPLASTY     TONSILLECTOMY      TONSILLECTOMY AND ADENOIDECTOMY     Social History  reports that she quit smoking about 32 years ago. Her smoking use included cigarettes. She has never used smokeless tobacco. She reports current alcohol use. She reports that she does not use drugs.  Allergies  Allergen Reactions   Codeine     Nausea/vomiting    Sulfa Antibiotics Nausea And Vomiting   Family History  Problem Relation Age of Onset   Heart disease Brother    Diabetes Brother    COPD Mother    Heart disease Father    Prior to Admission medications   Medication Sig Start Date End Date Taking? Authorizing Provider  acetaminophen (TYLENOL) 500 MG tablet Take 1,000 mg by mouth every 6 (six) hours as needed for mild pain or moderate pain.   Yes [provider]  Ascorbic Acid (VITAMIN C) 1000 MG tablet Take 1,000 mg by mouth daily.   Yes [provider]  atorvastatin (LIPITOR) 10 MG tablet Take 10 mg by mouth daily.   Yes [provider]  Cholecalciferol 125 MCG (5000 UT) capsule Take 5,000 Units by mouth daily.   Yes [provider]  furosemide (LASIX) 20 MG tablet Take 20 mg by mouth daily. Take with banana or OJ   Yes [provider]  lisinopril (ZESTRIL) 40 MG tablet Take 80 mg by mouth daily.   Yes [provider]  pantoprazole (PROTONIX) 40 MG tablet Take 40 mg by mouth daily. 05/18/21  Yes [provider]  pyridOXINE (VITAMIN B-6) 100 MG tablet Take 100 mg by mouth daily.   Yes [provider]  tiZANidine (ZANAFLEX) 2 MG tablet Take 2 mg by mouth 3 (three) times daily as needed for muscle spasms.   Yes [provider]  traMADol (ULTRAM) 50 MG tablet Take 1 tablet (50 mg total) by mouth every 6 (six) hours as needed. Patient taking differently: Take 50 mg by mouth every 6 (six) hours as needed for moderate pain. 05/24/21  Yes Saverio Danker, PA-C  vitamin C (ASCORBIC ACID) 500 MG tablet Take 1,000 mg by mouth daily.   Yes [provider]  zinc gluconate 50 MG tablet Take 50 mg by mouth daily.   Yes [provider]   Physical Exam: Vitals:   06/07/21 1430 06/07/21 1500 06/07/21 1515 06/07/21 1530  BP: (!) 153/84 (!) 154/90 (!) 179/78 (!) 178/84  Pulse: 70 85 81 86  Resp: (!) 22 19 15 17   Temp:      TempSrc:      SpO2: 97% (!) 88% 100% 98%   Constitutional: Chronically ill-appearing.  NAD, calm, comfortable Eyes: PERRL, lids and conjunctivae are pale. ENMT: Mucous membranes are moist. Posterior pharynx clear of any exudate or lesions. Neck: normal, supple, no masses, no thyromegaly Respiratory: clear to auscultation bilaterally, no wheezing, no crackles. Normal respiratory effort. No accessory muscle use.  Cardiovascular: Regular rate and rhythm, no murmurs / rubs / gallops. No extremity edema. 2+ pedal pulses. No carotid bruits.  Abdomen: Obese, healing surgical scars.  Bowel sounds positive.  No distention.  No tenderness, no masses palpated. No hepatosplenomegaly.  Musculoskeletal: no clubbing / cyanosis. Good ROM, no contractures. Normal muscle tone.  Skin: no acute rashes, lesions, ulcers on very limited otological examination Neurologic: CN 2-12 grossly intact. Sensation intact, DTR normal. Strength 5/5 in all 4.  Psychiatric: Normal judgment and insight. Alert and oriented x 3. Normal mood.   Labs on Admission: I have personally reviewed following labs and imaging studies  CBC: Recent Labs  Lab 06/07/21 1335  WBC 10.3  NEUTROABS 8.6*  HGB 7.9*  HCT 25.5*  MCV 87.6  PLT 702    Basic Metabolic Panel: Recent Labs  Lab 06/07/21 1335  NA 135  K 3.9  CL 107  CO2 20*  GLUCOSE 101*  BUN 29*  CREATININE 1.04*  CALCIUM 8.8*    GFR: CrCl cannot be calculated (Unknown ideal weight.).  Liver Function Tests: Recent Labs  Lab 06/07/21 1335  AST 16  ALT 38  ALKPHOS 53  BILITOT 0.8  PROT 6.4*  ALBUMIN 3.4*   Radiological Exams on Admission: X-ray chest PA and  lateral  Result Date: 06/07/2021 CLINICAL DATA:  Dizziness. EXAM: CHEST - 2 VIEW COMPARISON:  December 02, 2010 FINDINGS: Chronic appearing increased lung markings are seen without evidence of acute infiltrate, pleural effusion or pneumothorax. There is mild, stable elevation of the right hemidiaphragm. The heart size and mediastinal contours are within normal limits. A radiopaque fusion plate and screws are seen overlying the cervical spine. Degenerative changes are noted throughout the thoracic spine and bilateral shoulders. IMPRESSION: Chronic appearing increased lung markings without evidence of acute or active cardiopulmonary disease. Electronically Signed   By: Virgina Norfolk M.D.   On: 06/07/2021 16:40   CT Head Wo Contrast  Result Date: 06/07/2021 CLINICAL DATA:  History of colon cancer with decreased blood pressure. EXAM: CT HEAD WITHOUT CONTRAST TECHNIQUE: Contiguous axial images were obtained from the base of the skull through the vertex without intravenous contrast. COMPARISON:  None. FINDINGS: Brain: There is mild cerebral atrophy with widening of the extra-axial spaces and ventricular dilatation. There are areas of decreased attenuation within the white matter tracts of the supratentorial brain, consistent with microvascular disease changes. Bilateral subcentimeter basal ganglia lacunar infarcts are noted. Vascular: No hyperdense vessel or unexpected calcification. Skull: Normal. Negative for fracture or focal lesion. Sinuses/Orbits: There is mild to moderate severity bilateral ethmoid sinus mucosal thickening. A small left ethmoid sinus osteoma is noted. Other: A 7 mm benign-appearing soft tissue calcification is seen within the right frontal scalp. IMPRESSION: 1. Generalized cerebral atrophy without an acute intracranial abnormality. 2. Bilateral subcentimeter basal ganglia lacunar infarcts. 3. Mild to moderate severity bilateral ethmoid sinus disease. Electronically Signed   By: Virgina Norfolk M.D.   On: 06/07/2021 15:35    EKG: Independently reviewed.  Vent. rate 65 BPM PR interval 62 ms QRS duration 106 ms QT/QTcB 406/423 ms P-R-T axes 46 54 35 Sinus rhythm Short PR interval Low voltage, precordial leads  Assessment/Plan Principal Problem:   Syncope and collapse Observation/telemetry. Fall precautions. Asked to call staff before getting out of bed. Check carotid ultrasound. Check echocardiogram in a.m. Pending above work-up cardiology evaluation if pertinent.  Active Problems:   Primary adenocarcinoma of ascending colon ([place]) Reschedule follow-up with oncology.    Essential hypertension Continue lisinopril 40 mg p.o. daily. Continue furosemide 20 mg p.o. daily. Monitor BP, renal function electrolytes.    Hyperlipidemia Continue atorvastatin 10 mg p.o. daily.    Coronary artery disease  Continue atorvastatin. Not on aspirin or beta-blocker.    GERD (gastroesophageal reflux disease) Continue pantoprazole 40 mg p.o. daily.    Chronic blood loss anemia Monitor H&H. Transfuse as needed.    Mild protein malnutrition (HCC) Protein supplementation. Consider nutritional services evaluation.     DVT prophylaxis: SCDs. Code Status:   Full code.  Family Communication:   Disposition Plan:   Patient is from:  Home.   Anticipated DC to:  Home.   Anticipated DC date:  06/08/2021.  Anticipated DC barriers: Pending work-up. Consults called:   Admission status:  Observation/telemetry.   Severity of Illness: High severity after having a syncopal episode while waiting at the cancer center.  The patient will remain for 24 hours for close monitoring and further work-up  Reubin Milan MD [facility]  How to contact the [facility] Attending or Consulting provider [place] or covering provider during after hours [place], for this patient?   Check the care team in [facility] and look for a) attending/consulting [facility] provider listed and b) the [facility] team  listed Log into [email or web address] and use [place]'s universal password to access. If you do not have the password, please contact the hospital operator. Locate the [facility] provider you are looking for under [facility] and page to a number that you can be directly reached. If you still have difficulty reaching the provider, please page the [facility] (Director on Call) for the Hospitalists listed on amion for assistance.  06/07/2021, 5:21 PM   This document was prepared using Dragon voice recognition software and may contain some unintended transcription errors.

## 2021-06-07 NOTE — ED Provider Notes (Signed)
[facility] DEPT Provider Note   CSN: [id number] Arrival date & time: 06/07/21  1158     History No chief complaint on file.   Cindy Patterson is a 86 y.o. female.  With past medical history of hypertension, hyperlipidemia and recently diagnosed adenocarcinoma the ascending colon's s/p right hemicolectomy on 05/21/2021 who presents emergency department with syncopal episode.  Patient was brought over by rapid response from the cancer center for syncopal episode abdomen BP of 40J systolic.  She was at the cancer center for follow-up of her primary adenocarcinoma when she had a syncopal episode.  She states that she stood up, felt lightheaded and then had a syncopal episode.  She denies blurry vision, chest pain, palpitations, shortness of breath, nausea or vomiting before or after the event.  She denies being incontinent of urine or stool during the event.  She denies any ongoing GI bleeding.  She does note that she had increase in her lisinopril dosing recently.  Per oncology's note she was found asleep in the exam room.  Per her daughter she did ambulate into the clinic by herself.  She was difficult to arouse and sluggish with responses.  She attempted to stand up and ambulate when she suddenly became weak.  Daughter caught her and she did not fall or hit her head.  Daughter states that she was confused and drowsy for around 15 minutes.  Per the family she had been recovering well at home with no signs of infection or bleeding.  She did take her blood pressure medication this morning including her Lasix, lisinopril.  Of note patient was admitted on 05/18/2021 as a direct admit from Dr.Mann with GI for complaint of acute GI bleeding.  She was in her normal state of health until 05/14/2021 when she began having dark maroon stools and clots.  She then had multiple episodes of the same with abdominal cramping.  She was found to have hemoglobin of 7.7 and sent to the  emergency department.  On 9/21 she had upper and lower endoscopy and found to have adenocarcinoma of her ascending colon.  She then underwent right hemicolectomy by Dr. Zenia Resides on 05/21/2021.  Postoperatively she was tolerating p.o. and had cessation of GI bleeding.  HPI     Past Medical History:  Diagnosis Date   Acid reflux    Back pain    Coronary artery disease    Diverticulosis    Essential hypertension    GERD (gastroesophageal reflux disease)    Hyperlipidemia    Hypertension    Osteoarthritis    Umbilical hernia     Patient Active Problem List   Diagnosis Date Noted   Primary adenocarcinoma of ascending colon ([place]) 06/07/2021   Acute GI bleeding 05/18/2021   Acute blood loss anemia 05/18/2021    Past Surgical History:  Procedure Laterality Date   ABDOMINAL HYSTERECTOMY     total vaginal   ABDOMINOPLASTY     APPENDECTOMY     BIOPSY  05/19/2021   Procedure: BIOPSY;  Surgeon: [name], MD;  Location: [facility];  Service: Endoscopy;;   COLONOSCOPY WITH PROPOFOL N/A 05/19/2021   Procedure: COLONOSCOPY WITH PROPOFOL;  Surgeon: [name], MD;  Location: [facility];  Service: Endoscopy;  Laterality: N/A;   DECOMPRESSIVE LUMBAR LAMINECTOMY LEVEL 1     DECOMPRESSIVE LUMBAR LAMINECTOMY LEVEL 4     ESOPHAGOGASTRODUODENOSCOPY (EGD) WITH PROPOFOL N/A 05/19/2021   Procedure: ESOPHAGOGASTRODUODENOSCOPY (EGD) WITH PROPOFOL;  Surgeon: [name], MD;  Location:  [facility];  Service: Endoscopy;  Laterality: N/A;   HERNIA REPAIR     umb hernia repair   JOINT REPLACEMENT     LAPAROSCOPIC RIGHT HEMI COLECTOMY N/A 05/21/2021   Procedure: LAPAROSCOPIC ASSISTED RIGHT HEMI COLECTOMY;  Surgeon: Dwan Bolt, MD;  Location: [facility];  Service: General;  Laterality: N/A;   REPLACEMENT TOTAL KNEE BILATERAL     RHINOPLASTY     TONSILLECTOMY     TONSILLECTOMY AND ADENOIDECTOMY       OB History   No obstetric history on file.     Family History  Problem Relation Age of  Onset   Heart disease Brother    Diabetes Brother    COPD Mother    Heart disease Father     Social History   Tobacco Use   Smoking status: Former    Types: Cigarettes    Quit date: 02/26/1989    Years since quitting: 32.2   Smokeless tobacco: Never   Tobacco comments:    Smoker 45 years ago  Vaping Use   Vaping Use: Never used  Substance Use Topics   Alcohol use: Yes    Comment: Occasional use.   Drug use: Never    Home Medications Prior to Admission medications   Medication Sig Start Date End Date Taking? Authorizing Provider  acetaminophen (TYLENOL) 500 MG tablet Take 1,000 mg by mouth every 6 (six) hours as needed for mild pain or moderate pain.    [provider]  Ascorbic Acid (VITAMIN C) 1000 MG tablet Take 1 capsule by mouth daily.    [provider]  aspirin 325 MG EC tablet Take 325 mg by mouth daily.    [provider]  atorvastatin (LIPITOR) 10 MG tablet Take 10 mg by mouth daily.    [provider]  Cholecalciferol (VITAMIN D PO) Take 1 tablet by mouth daily.    [provider]  Cholecalciferol (VITAMIN D3) 10 MCG (400 UNIT) CAPS Take 10 mcg by mouth at bedtime.    [provider]  furosemide (LASIX) 20 MG tablet Take 20 mg by mouth daily. Take with banana or OJ    [provider]  hydrochlorothiazide (HYDRODIURIL) 25 MG tablet Take 25 mg by mouth daily.    [provider]  lisinopril (ZESTRIL) 40 MG tablet Take 80 mg by mouth daily.    [provider]  Multiple Vitamin (MULTIVITAMIN WITH MINERALS) TABS tablet Take 1 tablet by mouth daily.    [provider]  omeprazole (PRILOSEC OTC) 20 MG tablet Take 20 mg by mouth daily.    [provider]  pyridOXINE (VITAMIN B-6) 100 MG tablet Take 100 mg by mouth daily.    [provider]  tiZANidine (ZANAFLEX) 2 MG tablet Take 2 mg by mouth 3 (three) times daily as needed.    [provider]  traMADol (ULTRAM)  50 MG tablet Take 1 tablet (50 mg total) by mouth every 6 (six) hours as needed. 05/24/21   Saverio Danker, PA-C  vitamin C (ASCORBIC ACID) 500 MG tablet Take 500 mg by mouth daily.    [provider]    Allergies    Codeine, Sulfa antibiotics, and Sulfa antibiotics  Review of Systems   Review of Systems  Constitutional:  Negative for appetite change, fatigue and fever.  Gastrointestinal:  Negative for blood in stool, diarrhea, nausea and vomiting.  Neurological:  Positive for dizziness, syncope and light-headedness.  Psychiatric/Behavioral:  Negative for confusion.   All  other systems reviewed and are negative.  Physical Exam Updated Vital Signs BP 113/64   Pulse (!) 51   Temp 97.8 F (36.6 C) (Oral)   Resp 18   SpO2 96%   Physical Exam Vitals and nursing note reviewed. Exam conducted with a chaperone present.  Constitutional:      General: She is not in acute distress.    Appearance: Normal appearance. She is not toxic-appearing.  HENT:     Head: Normocephalic.     Mouth/Throat:     Mouth: Mucous membranes are dry.     Pharynx: Oropharynx is clear. No posterior oropharyngeal erythema.  Eyes:     General: No scleral icterus.    Extraocular Movements: Extraocular movements intact.     Pupils: Pupils are equal, round, and reactive to light.     Comments: Conjunctive a pale bilaterally  Cardiovascular:     Rate and Rhythm: Normal rate and regular rhythm.     Pulses: Normal pulses.     Heart sounds: Normal heart sounds. No murmur heard. Pulmonary:     Effort: Pulmonary effort is normal. No respiratory distress.     Breath sounds: Normal breath sounds.  Abdominal:     General: Bowel sounds are normal. There is no distension.     Palpations: Abdomen is soft.     Tenderness: There is no abdominal tenderness.  Genitourinary:    Rectum: Normal. No mass or tenderness.     Comments: Digital rectal exam for Hemoccult stool. Stool in the rectal vault.  No palpable  masses or impactions. Musculoskeletal:        General: Normal range of motion.     Cervical back: Normal range of motion.  Skin:    General: Skin is warm and dry.     Capillary Refill: Capillary refill takes less than 2 seconds.     Coloration: Skin is pale.  Neurological:     General: No focal deficit present.     Mental Status: She is alert and oriented to person, place, and time. Mental status is at baseline.  Psychiatric:        Mood and Affect: Mood normal.        Behavior: Behavior normal.    ED Results / Procedures / Treatments   Labs (all labs ordered are listed, but only abnormal results are displayed) Labs Reviewed  COMPREHENSIVE METABOLIC PANEL - Abnormal; Notable for the following components:      Result Value   CO2 20 (*)    Glucose, Bld 101 (*)    BUN 29 (*)    Creatinine, Ser 1.04 (*)    Calcium 8.8 (*)    Total Protein 6.4 (*)    Albumin 3.4 (*)    GFR, Estimated 53 (*)    All other components within normal limits  CBC WITH DIFFERENTIAL/PLATELET - Abnormal; Notable for the following components:   RBC 2.91 (*)    Hemoglobin 7.9 (*)    HCT 25.5 (*)    Neutro Abs 8.6 (*)    All other components within normal limits  CBC  COMPREHENSIVE METABOLIC PANEL  POC OCCULT BLOOD, ED  TYPE AND SCREEN  TROPONIN I (HIGH SENSITIVITY)  TROPONIN I (HIGH SENSITIVITY)   EKG EKG Interpretation  Date/Time:  Monday June 07 2021 12:57:40 EDT Ventricular Rate:  65 PR Interval:  62 QRS Duration: 106 QT Interval:  406 QTC Calculation: 423 R Axis:   54 Text Interpretation: Sinus rhythm Short PR interval Low voltage, precordial  leads No significant change since last tracing Confirmed by Calvert Cantor [phone]) on 06/07/2021 1:56:42 PM  Radiology X-ray chest PA and lateral  Result Date: 06/07/2021 CLINICAL DATA:  Dizziness. EXAM: CHEST - 2 VIEW COMPARISON:  December 02, 2010 FINDINGS: Chronic appearing increased lung markings are seen without evidence of acute infiltrate,  pleural effusion or pneumothorax. There is mild, stable elevation of the right hemidiaphragm. The heart size and mediastinal contours are within normal limits. A radiopaque fusion plate and screws are seen overlying the cervical spine. Degenerative changes are noted throughout the thoracic spine and bilateral shoulders. IMPRESSION: Chronic appearing increased lung markings without evidence of acute or active cardiopulmonary disease. Electronically Signed   By: Virgina Norfolk M.D.   On: 06/07/2021 16:40   CT Head Wo Contrast  Result Date: 06/07/2021 CLINICAL DATA:  History of colon cancer with decreased blood pressure. EXAM: CT HEAD WITHOUT CONTRAST TECHNIQUE: Contiguous axial images were obtained from the base of the skull through the vertex without intravenous contrast. COMPARISON:  None. FINDINGS: Brain: There is mild cerebral atrophy with widening of the extra-axial spaces and ventricular dilatation. There are areas of decreased attenuation within the white matter tracts of the supratentorial brain, consistent with microvascular disease changes. Bilateral subcentimeter basal ganglia lacunar infarcts are noted. Vascular: No hyperdense vessel or unexpected calcification. Skull: Normal. Negative for fracture or focal lesion. Sinuses/Orbits: There is mild to moderate severity bilateral ethmoid sinus mucosal thickening. A small left ethmoid sinus osteoma is noted. Other: A 7 mm benign-appearing soft tissue calcification is seen within the right frontal scalp. IMPRESSION: 1. Generalized cerebral atrophy without an acute intracranial abnormality. 2. Bilateral subcentimeter basal ganglia lacunar infarcts. 3. Mild to moderate severity bilateral ethmoid sinus disease. Electronically Signed   By: Virgina Norfolk M.D.   On: 06/07/2021 15:35    Procedures Procedures   Medications Ordered in ED Medications  sodium chloride flush (NS) 0.9 % injection 3 mL (has no administration in time range)  enoxaparin  (LOVENOX) injection 40 mg (40 mg Subcutaneous Given 06/07/21 1640)  acetaminophen (TYLENOL) tablet 650 mg (has no administration in time range)    Or  acetaminophen (TYLENOL) suppository 650 mg (has no administration in time range)  ondansetron (ZOFRAN) tablet 4 mg (has no administration in time range)    Or  ondansetron (ZOFRAN) injection 4 mg (has no administration in time range)  ascorbic acid (VITAMIN C) tablet 1,000 mg (has no administration in time range)  zinc sulfate capsule 220 mg (has no administration in time range)  atorvastatin (LIPITOR) tablet 10 mg (has no administration in time range)  traMADol (ULTRAM) tablet 50 mg (has no administration in time range)  pantoprazole (PROTONIX) EC tablet 40 mg (has no administration in time range)  pyridOXINE (VITAMIN B-6) tablet 100 mg (100 mg Oral Given 06/07/21 1837)  tiZANidine (ZANAFLEX) tablet 2 mg (has no administration in time range)  hydrALAZINE (APRESOLINE) tablet 25 mg (25 mg Oral Given 06/07/21 1838)  lisinopril (ZESTRIL) tablet 40 mg (has no administration in time range)    ED Course  I have reviewed the triage vital signs and the nursing notes.  Pertinent labs & imaging results that were available during my care of the patient were reviewed by me and considered in my medical decision making (see chart for details).  Clinical Course as of 06/07/21 1901  Mon Jun 07, 2021  1522 Adenocarcinoma s/p colon resection sep 23 Sent from cancer center w/ syncopal episode, low BP, anemic and worsening, hemoccult negative  +-  bradycardic, CT head read pending Pending admit * [CP]    Clinical Course User Index [CP] Prosperi, Jackalyn Lombard   MDM Rules/Calculators/A&P 86 year old female presents emergency department after syncope and hypotension.  See HPI, PE and work-up as described above.   Patient presents with symptoms consistent with syncope which is most likely multifactorial.    Her labs do not appear to reflect  dehydration however she does have small bump in Scr 1.04 (0.9) and blood pressure was 70 systolic at cancer center. Hemoglobin 7.9 from previous of 8.2 prior to discharge from hospital admission.  Is not significantly decreased, however she is pale on exam.  Hemoccult negative on rectal exam. Her presentation is not consistent with a seizure given short time course, no postictal state and no seizure activity noted. Have low suspicion for acute neurological event including ICH given lack of trauma, or stroke given no focal neurological deficits.  Obtaining CT head given history of newly found adenocarcinoma which was resected and want to R/O brain mets. I have low suspicion this is due to a PE given that she is not tachycardic, tachypneic, hypoxic. Doubt dissection. EKG without life-threatening arrhythmia or conduction abnormality. Doubt ACS, EKG as described above and initial troponin negative. Given her age and risk factors, recent history and work-up today will admit her.   1540: spoke with Dr. Olevia Bowens who agrees to admit patient at this time  Final Clinical Impression(s) / ED Diagnoses Final diagnoses:  Syncope and collapse  Acute hypotension    Rx / DC Orders ED Discharge Orders     None        Mickie Hillier, PA-C 06/07/21 1902    Truddie Hidden, MD 06/08/21 [phone]

## 2021-06-07 NOTE — Progress Notes (Addendum)
[place]   Telephone:([phone] Fax:([phone]) [phone]   Clinic New Consult Note   Patient Care Team: Merrilee Seashore, MD as PCP - General (Internal Medicine) Merrilee Seashore, MD (Internal Medicine) 06/07/2021  CHIEF COMPLAINTS/PURPOSE OF CONSULTATION:  Colon cancer, referred by Dr. Michaelle Birks  HISTORY OF PRESENTING ILLNESS:  Cindy Patterson 86 y.o. female with PMH including CAD, GERD, HTN, HL is here because of newly diagnosed colon cancer.  Approximately 1 month ago she developed large-volume dark stool and abdominal pain/cramping.  She was seen by her PCP and found to have hemoglobin of 7.7, she was subsequently referred to GI and underwent work-up.  Diagnostic EGD 05/19/2021 by Dr. Collene Mares was unremarkable, however colonoscopy showed an ulcerated, partially obstructing circumferential mass in the proximal ascending colon.  The scope could not be passed into the cecum.  Path showed adenocarcinoma with loss of nuclear expression in MLH1 and PMS2, and MSI-high.  Further molecular testing showed the presence of MLH1 hyper methylation, suggestive of a sporadic/somatic mutation.  She was directly admitted to the hospital, staging CT CAP on 05/19/2021 showed a known 4 cm left thyroid mass and a 2.5 cm apple core type lesion in the ascending colon consistent with the biopsy-proven adenocarcinoma.  No findings of local regional adenopathy, or metastatic disease in the chest, abdomen, or pelvis.  There was incidental finding of advanced atherosclerosis and a thoracic and abdominal aorta and coronary arteries, as well as severe degenerative changes in the shoulders.  Baseline CEA 05/19/2021 normal at 2.6.  She was taken to the operating room by Dr. Zenia Resides and underwent lap right hemicolectomy with ileocolonic anastomosis on 05/21/2021.  Surgical path showed invasive moderately differentiated adenocarcinoma spanning 2.5 cm invading the submucosa, and resection margins are negative, 19 lymph  nodes are negative for carcinoma.  This was staged PT1PN0.  Postoperative course was uneventful, able to be discharged home on day 3.  Socially, she is widowed, a retired Haematologist.  Lives independently and active.  She is staying with her daughter Lattie Haw presently.  Today, she presents in a wheelchair, she is found asleep in the exam room.  Per her daughters, she walked in by herself and checked herself and at 10:45 AM, she sat in a wheelchair to wait for her appointment time then fell asleep.  She is difficult to arouse and sluggish with responses.  Eventually becomes oriented to person and place.  She is generally weak.  Per her family, she has been recovering well at home, no recent signs of infection or bleeding.  She took her blood pressure medication this morning including Lasix, lisinopril, and HCTZ.  She has not taken tramadol recently to their knowledge.  She is not taking aspirin routinely.  MEDICAL HISTORY:  Past Medical History:  Diagnosis Date   Acid reflux    Back pain    Coronary artery disease    Diverticulosis    Essential hypertension    GERD (gastroesophageal reflux disease)    Hyperlipidemia    Hypertension    Osteoarthritis    Umbilical hernia     SURGICAL HISTORY: Past Surgical History:  Procedure Laterality Date   ABDOMINAL HYSTERECTOMY     total vaginal   ABDOMINOPLASTY     APPENDECTOMY     BIOPSY  05/19/2021   Procedure: BIOPSY;  Surgeon: [name], MD;  Location: [facility];  Service: Endoscopy;;   COLONOSCOPY WITH PROPOFOL N/A 05/19/2021   Procedure: COLONOSCOPY WITH PROPOFOL;  Surgeon: [name], MD;  Location: [facility];  Service: Endoscopy;  Laterality: N/A;   DECOMPRESSIVE LUMBAR LAMINECTOMY LEVEL 1     DECOMPRESSIVE LUMBAR LAMINECTOMY LEVEL 4     ESOPHAGOGASTRODUODENOSCOPY (EGD) WITH PROPOFOL N/A 05/19/2021   Procedure: ESOPHAGOGASTRODUODENOSCOPY (EGD) WITH PROPOFOL;  Surgeon: [name], MD;  Location: [facility];  Service: Endoscopy;   Laterality: N/A;   HERNIA REPAIR     umb hernia repair   JOINT REPLACEMENT     LAPAROSCOPIC RIGHT HEMI COLECTOMY N/A 05/21/2021   Procedure: LAPAROSCOPIC ASSISTED RIGHT HEMI COLECTOMY;  Surgeon: Dwan Bolt, MD;  Location: [facility];  Service: General;  Laterality: N/A;   REPLACEMENT TOTAL KNEE BILATERAL     RHINOPLASTY     TONSILLECTOMY     TONSILLECTOMY AND ADENOIDECTOMY      SOCIAL HISTORY: Social History   Socioeconomic History   Marital status: Widowed    Spouse name: Not on file   Number of children: Not on file   Years of education: Not on file   Highest education level: Not on file  Occupational History   Not on file  Tobacco Use   Smoking status: Former    Types: Cigarettes    Quit date: 02/26/1989    Years since quitting: 32.2   Smokeless tobacco: Never   Tobacco comments:    Smoker 45 years ago  Vaping Use   Vaping Use: Never used  Substance and Sexual Activity   Alcohol use: Yes    Comment: Occasional use.   Drug use: Never   Sexual activity: Not on file  Other Topics Concern   Not on file  Social History Narrative   ** Merged History Encounter **       Lives alone.  Independent.  Retired Haematologist.  Spouse was a Psychiatric nurse.   Social Determinants of Health   Financial Resource Strain: Not on file  Food Insecurity: Not on file  Transportation Needs: Not on file  Physical Activity: Not on file  Stress: Not on file  Social Connections: Not on file  Intimate Partner Violence: Not on file    FAMILY HISTORY: Family History  Problem Relation Age of Onset   Heart disease Brother    Diabetes Brother    COPD Mother    Heart disease Father     ALLERGIES:  is allergic to codeine, sulfa antibiotics, and sulfa antibiotics.  MEDICATIONS:  Current Outpatient Medications  Medication Sig Dispense Refill   acetaminophen (TYLENOL) 500 MG tablet Take 1,000 mg by mouth every 6 (six) hours as needed for mild pain or moderate pain.     Ascorbic Acid  (VITAMIN C) 1000 MG tablet Take 1 capsule by mouth daily.     aspirin 325 MG EC tablet Take 325 mg by mouth daily.     atorvastatin (LIPITOR) 10 MG tablet Take 10 mg by mouth daily.     Cholecalciferol (VITAMIN D PO) Take 1 tablet by mouth daily.     Cholecalciferol (VITAMIN D3) 10 MCG (400 UNIT) CAPS Take 10 mcg by mouth at bedtime.     furosemide (LASIX) 20 MG tablet Take 20 mg by mouth daily. Take with banana or OJ     hydrochlorothiazide (HYDRODIURIL) 25 MG tablet Take 25 mg by mouth daily.     lisinopril (ZESTRIL) 40 MG tablet Take 80 mg by mouth daily.     Multiple Vitamin (MULTIVITAMIN WITH MINERALS) TABS tablet Take 1 tablet by mouth daily.     omeprazole (PRILOSEC OTC) 20 MG tablet Take  20 mg by mouth daily.     pyridOXINE (VITAMIN B-6) 100 MG tablet Take 100 mg by mouth daily.     tiZANidine (ZANAFLEX) 2 MG tablet Take 2 mg by mouth 3 (three) times daily as needed.     traMADol (ULTRAM) 50 MG tablet Take 1 tablet (50 mg total) by mouth every 6 (six) hours as needed. 20 tablet 0   vitamin C (ASCORBIC ACID) 500 MG tablet Take 500 mg by mouth daily.     No current facility-administered medications for this visit.    REVIEW OF SYSTEMS:   Patient not able to answer questions  PHYSICAL EXAMINATION:  Vitals:   06/07/21 1102  BP: (!) 82/48  Pulse: 98  Resp: 15  Temp: 97.8 F (36.6 C)  SpO2: 96%   69/54 P 66 99% room air  Fluid bolus started via peripheral IV in the right forearm, BP 79/60  Filed Weights    GENERAL: Asleep in wheelchair, no obvious distress SKIN: Warm EYES: sclera clear.  pinpoint pupils LUNGS: clear with normal breathing effort HEART: regular rate & rhythm, mild right greater than left lower extremity edema PSYCH/neuro: Asleep at first, sluggish to arouse then lethargic.  Oriented to person and place.  Slurred speech.  Generalized weakness   LABORATORY DATA:  I have reviewed the data as listed CBC Latest Ref Rng & Units 05/22/2021 05/21/2021  05/21/2021  WBC 4.0 - 10.5 K/uL 8.9 - 4.3  Hemoglobin 12.0 - 15.0 g/dL 8.2(L) 10.2(L) 7.4(L)  Hematocrit 36.0 - 46.0 % 25.4(L) 30.0(L) 23.3(L)  Platelets 150 - 400 K/uL 350 - 311   CMP Latest Ref Rng & Units 05/22/2021 05/21/2021 05/21/2021  Glucose 70 - 99 mg/dL 91 149(H) -  BUN 8 - 23 mg/dL 16 8 -  Creatinine 0.44 - 1.00 mg/dL 0.90 0.90 0.86  Sodium 135 - 145 mmol/L 132(L) 134(L) -  Potassium 3.5 - 5.1 mmol/L 4.3 4.2 -  Chloride 98 - 111 mmol/L 104 102 -  CO2 22 - 32 mmol/L 23 - -  Calcium 8.9 - 10.3 mg/dL 8.9 - -  Total Protein 6.5 - 8.1 g/dL - - -  Total Bilirubin 0.3 - 1.2 mg/dL - - -  Alkaline Phos 38 - 126 U/L - - -  AST 15 - 41 U/L - - -  ALT 0 - 44 U/L - - -     RADIOGRAPHIC STUDIES: I have personally reviewed the radiological images as listed and agreed with the findings in the report. CT CHEST ABDOMEN PELVIS W CONTRAST  Result Date: 05/19/2021 CLINICAL DATA:  Colorectal cancer staging. EXAM: CT CHEST, ABDOMEN, AND PELVIS WITH CONTRAST TECHNIQUE: Multidetector CT imaging of the chest, abdomen and pelvis was performed following the standard protocol during bolus administration of intravenous contrast. CONTRAST:  55m OMNIPAQUE IOHEXOL 350 MG/ML SOLN COMPARISON:  02/24/2014 FINDINGS: CT CHEST FINDINGS Cardiovascular: The heart is normal in size. No pericardial effusion. The aorta demonstrates moderate tortuosity, mild ectasia and moderate atherosclerotic calcifications. Dense three-vessel coronary artery calcifications are noted. Mediastinum/Nodes: No mediastinal or hilar mass or adenopathy. The esophagus is grossly normal. There is a complex 4.5 x 4.2 cm mass associated with the left thyroid lobe. This has been evaluated on previous imaging. (ref: J Am Coll Radiol. 2015 Feb;12(2): 143-50). Lungs/Pleura: No acute pulmonary findings. No worrisome pulmonary lesions. No pulmonary nodules to suggest pulmonary metastatic disease. Musculoskeletal: Severe degenerative changes involving both  shoulders with joint effusions and large bursal fluid collections. No acute bony findings or destructive  bony changes. Cervical spine fusion hardware is noted. Advanced degenerative changes involving the thoracolumbar spine and significant scoliosis. CT ABDOMEN PELVIS FINDINGS Hepatobiliary: No hepatic lesions to suggest metastatic disease. No intrahepatic biliary dilatation. The gallbladder is grossly normal. No common bile duct dilatation. Pancreas: No mass, inflammation or ductal dilatation. Spleen: Normal size.  No focal lesions. Adrenals/Urinary Tract: Adrenal glands and kidneys are grossly normal. No worrisome renal lesions or hydronephrosis. The bladder is unremarkable. Stomach/Bowel: The stomach, duodenum and small bowel are unremarkable. There is an apple-core type neoplasm involving the ascending colon. No findings to suggest tumor invasion of the pericolonic fat. No adjacent ileocolic adenopathy. The remainder of the colon is grossly normal. Vascular/Lymphatic: Advanced atherosclerotic calcifications involving the aorta and iliac arteries and branch vessels but no aneurysm. Major venous structures are patent. No mesenteric or retroperitoneal mass or adenopathy. Reproductive: Surgically absent. Other: There is a 3.5 cm cystic structure in the subcutaneous fat overlying the left abdominal oblique muscles. This is likely a benign subcutaneous cyst. No significant change since the prior study 2015. Musculoskeletal: No significant bony findings. Stable degenerative changes involving the spine hips. No worrisome bone lesions. IMPRESSION: 1. 2.5 cm apple-core type ascending colon lesion consistent with known colon cancer. No findings for local tumor invasion of the pericolonic fat or adjacent ileocolic adenopathy. 2. No findings for metastatic disease involving the chest, abdomen or pelvis. 3. Advanced atherosclerotic calcifications involving the thoracic and abdominal aorta and branch vessels including the  coronary arteries. 4. Severe degenerative changes involving both shoulders with joint effusions and large bursal fluid collections. 5. Stable complex left thyroid lobe mass, previously addressed. 6. Aortic atherosclerosis. Aortic Atherosclerosis (ICD10-I70.0). Electronically Signed   By: Marijo Sanes M.D.   On: 05/19/2021 21:41    ASSESSMENT & PLAN: 86 year old female with PMH including CAD, GERD, HTN, HL  Hypotension, altered mental status Moderately differentiated adenocarcinoma of the ascending colon, PT1N0, stage I, MSI-high Advanced atherosclerosis seen on CT CAP 05/19/2021 for colon cancer staging work-up  Dispo: Ms. Pinedo presents with her daughters today for new patient consult for stage I right colon cancer s/p right hemicolectomy 05/21/2021 by Dr. Michaelle Birks.  Per her daughters she has been recovering well at home but staying with daughter Lattie Haw at this time, she is active and independent.  Took her usual meds this morning including Lasix, lisinopril, HCTZ.  She ambulated independently and checked herself into the cancer center today, last known well at 10:45 AM then fell asleep in a wheelchair waiting for the appointment. She was difficult to arouse with generalized weakness and slurred speech.  BP 82/48 initially, then 98-33'A systolic last 25/05, 39% O2 on room air, weak pulse 66.  NS bolus started to right forearm IV.  Rapid response was called to the room and Pt was transported to the ED for further work-up accompanied by 2 daughters and RRT nurse. The appointment will be rescheduled.    Alla Feeling, NP 06/07/2021   Addendum  86 year old lady, with past medical history of hypertension, coronary artery disease, GERD, who was recently diagnosed with stage I moderately differentiated right-sided colon cancer, status post hemicolectomy.  Her tumor was MSI high, which predicts better outcome.  She was found to be hypotensive with altered mental status when she checked in.  We called  the rapid response team, and started her on IV fluids immediately, and sent her to [facility] emergency room.   Patient was admitted for observation, and discharged home today.  I called her  daughter Lattie Haw, and reviewed her course of hospital event, recent diagnosis of colon cancer, overall prognosis and surveillance plan.  She certainly does not need adjuvant chemotherapy due to the low risk of recurrence.  I recommend IV iron for her moderate anemia and iron deficiency, I think anemia may contribute to her syncope and hypotension episode yesterday.  She just started oral iron, will continue.  We will schedule her return for IV Venofer 366m weekly three times.  I reviewed the benefit and side effect of IV iron with LLattie Haw she will discuss with pt and agrees to schedule.   Will see her back when she returns.  YTruitt Merle 06/08/2021

## 2021-06-07 NOTE — ED Triage Notes (Signed)
Brought over by rapid response from cancer center, patient was there for a f/u on colon cancer. BP was found to be in the 85T-09P systolic and pt symptomatic. Has been getting dizzy and lightheaded w/ position changes all weekend, took her BP meds and lasix today. Getting a 1 L NS bolus now. Alert and oriented. Reports generalized weakness.

## 2021-06-08 ENCOUNTER — Observation Stay (HOSPITAL_BASED_OUTPATIENT_CLINIC_OR_DEPARTMENT_OTHER): Payer: Medicare Other

## 2021-06-08 ENCOUNTER — Encounter: Payer: Self-pay | Admitting: Hematology

## 2021-06-08 DIAGNOSIS — R55 Syncope and collapse: Secondary | ICD-10-CM

## 2021-06-08 DIAGNOSIS — D500 Iron deficiency anemia secondary to blood loss (chronic): Secondary | ICD-10-CM | POA: Insufficient documentation

## 2021-06-08 LAB — CBC
HCT: 24.7 % — ABNORMAL LOW (ref 36.0–46.0)
Hemoglobin: 7.8 g/dL — ABNORMAL LOW (ref 12.0–15.0)
MCH: 27.3 pg (ref 26.0–34.0)
MCHC: 31.6 g/dL (ref 30.0–36.0)
MCV: 86.4 fL (ref 80.0–100.0)
Platelets: 326 10*3/uL (ref 150–400)
RBC: 2.86 MIL/uL — ABNORMAL LOW (ref 3.87–5.11)
RDW: 15.4 % (ref 11.5–15.5)
WBC: 5.9 10*3/uL (ref 4.0–10.5)
nRBC: 0 % (ref 0.0–0.2)

## 2021-06-08 LAB — COMPREHENSIVE METABOLIC PANEL
ALT: 8 U/L (ref 0–44)
AST: 12 U/L — ABNORMAL LOW (ref 15–41)
Albumin: 3.1 g/dL — ABNORMAL LOW (ref 3.5–5.0)
Alkaline Phosphatase: 53 U/L (ref 38–126)
Anion gap: 7 (ref 5–15)
BUN: 21 mg/dL (ref 8–23)
CO2: 21 mmol/L — ABNORMAL LOW (ref 22–32)
Calcium: 9 mg/dL (ref 8.9–10.3)
Chloride: 108 mmol/L (ref 98–111)
Creatinine, Ser: 0.79 mg/dL (ref 0.44–1.00)
GFR, Estimated: >60 mL/min (ref >60)
Glucose, Bld: 92 mg/dL (ref 70–99)
Potassium: 3.5 mmol/L (ref 3.5–5.1)
Sodium: 136 mmol/L (ref 135–145)
Total Bilirubin: 0.8 mg/dL (ref 0.3–1.2)
Total Protein: 5.9 g/dL — ABNORMAL LOW (ref 6.5–8.1)

## 2021-06-08 LAB — ECHOCARDIOGRAM COMPLETE
Area-P 1/2: 3.39 cm2
Height: 61 in
S' Lateral: 2.5 cm
Weight: 2433.9 oz

## 2021-06-08 LAB — IRON AND TIBC
Iron: 21 ug/dL — ABNORMAL LOW (ref 28–170)
Saturation Ratios: 6 % — ABNORMAL LOW (ref 10.4–31.8)
TIBC: 351 ug/dL (ref 250–450)
UIBC: 330 ug/dL

## 2021-06-08 LAB — TRANSFERRIN: Transferrin: 251 mg/dL (ref 192–382)

## 2021-06-08 LAB — FERRITIN: Ferritin: 32 ng/mL (ref 11–307)

## 2021-06-08 MED ORDER — AMLODIPINE BESYLATE 5 MG PO TABS: 5.0000 mg | ORAL_TABLET | Freq: Every day | ORAL | 0 refills | Status: AC

## 2021-06-08 MED ORDER — FERROUS SULFATE 325 (65 FE) MG PO TABS: 325.0000 mg | ORAL_TABLET | Freq: Every day | ORAL | 0 refills | Status: DC

## 2021-06-08 MED ORDER — AMLODIPINE BESYLATE 5 MG PO TABS
5.0000 mg | ORAL_TABLET | Freq: Every day | ORAL | Status: DC
  Administered 2021-06-08: 5 mg via ORAL
  Filled 2021-06-08: qty 1

## 2021-06-08 MED ORDER — FUROSEMIDE 20 MG PO TABS: 20.0000 mg | ORAL_TABLET | Freq: Every day | ORAL | 0 refills | Status: DC | PRN

## 2021-06-08 MED ORDER — LISINOPRIL 40 MG PO TABS: 40.0000 mg | ORAL_TABLET | Freq: Every day | ORAL | 0 refills | Status: DC

## 2021-06-08 NOTE — Discharge Summary (Signed)
Discharge Summary:   ROSANGELICA Patterson   QIW:[id number] DOB: 05/22/1935 DOA: 06/07/2021  PCP: Merrilee Seashore, MD  Admit date: 06/07/2021 Discharge date: 06/08/2021  Admitted From: Home Disposition: Home  Discharge Condition: Improved and stable CODE STATUS: Full code Diet recommendation: Heart healthy   Hospital Course:   86 year old Caucasian female with a past medical history of GERD, chronic back pain, coronary artery disease, diverticulosis, essential hypertension, dyslipidemia, newly diagnosed colon cancer status post right hemicolectomy with ileocolonic anastomosis on 05/21/2021.  Surgical pathology showed invasive moderately differentiated adenocarcinoma.  This was staged PT1PN0.  She was in the waiting room at the cancer center.  When she suddenly stood up on her hearing her name and immediately syncopized and lost consciousness.  She was assisted by her 2 daughters.  Denied any prodromal symptoms.  Felt a little groggy for about 10 minutes after passing out.  Sent to the ER for evaluation for syncope.  CT head showed no acute findings.  Two-view chest radiograph showed no acute findings as well.  Orthostatics were obtained.  Per nursing staff they were normal.  She is noted to be anemic but did not require blood transfusion during this hospitalization.  Iron studies were obtained.  Iron was low.  Will be discharged on iron supplementation.  Carotid duplex ultrasound was unremarkable as well.  Echo was unremarkable.  She is on lisinopril 80 mg daily.  I have adjusted this to 40 mg daily.  In addition have started amlodipine 5 mg daily.  I have switched her Lasix from scheduled to as needed for her peripheral edema.  She will need to follow-up with her primary care physician within 1 week.  Likely syncopized secondary to vasovagal.   Discharge Diagnoses:   Vasovagal syncope Iron deficiency anemia Accelerated hypertension Coronary artery  disease GERD Diverticulosis Dyslipidemia Newly diagnosed colon cancer status post right hemicolectomy with ileocolonic anastomosis on 05/21/2021   Discharge Instructions:    Discharge Instructions     Call MD for:  persistant dizziness or light-headedness   Complete by: As directed    Diet - low sodium heart healthy   Complete by: As directed    Discharge instructions   Complete by: As directed    1. Follow- up with PCP in 1 week 2. Your Lasix has been changed to as needed 3. Reduce lisinopril to 40 mg daily 4. Start amlodipine 5 mg daily 5. Start iron 325 mg daily 6. Have your PCP recheck iron studies and H/H in 4 weeks   Increase activity slowly   Complete by: As directed       Allergies as of 06/08/2021       Reactions   Codeine    Nausea/vomiting    Sulfa Antibiotics Nausea And Vomiting        Medication List     TAKE these medications    acetaminophen 500 MG tablet Commonly known as: TYLENOL Take 1,000 mg by mouth every 6 (six) hours as needed for mild pain or moderate pain.   amLODipine 5 MG tablet Commonly known as: NORVASC Take 1 tablet (5 mg total) by mouth daily. Start taking on: June 09, 2021   atorvastatin 10 MG tablet Commonly known as: LIPITOR Take 10 mg by mouth daily.   Cholecalciferol 125 MCG (5000 UT) capsule Take 5,000 Units by mouth daily.   ferrous sulfate 325 (65 FE) MG tablet Take 1 tablet (325 mg total) by mouth daily with breakfast.   furosemide 20 MG tablet  Commonly known as: LASIX Take 1 tablet (20 mg total) by mouth daily as needed for edema. Take with banana or OJ What changed:  when to take this reasons to take this   lisinopril 40 MG tablet Commonly known as: ZESTRIL Take 1 tablet (40 mg total) by mouth daily. Start taking on: June 09, 2021 What changed: how much to take   pantoprazole 40 MG tablet Commonly known as: PROTONIX Take 40 mg by mouth daily.   pyridOXINE 100 MG tablet Commonly known as:  VITAMIN B-6 Take 100 mg by mouth daily.   tiZANidine 2 MG tablet Commonly known as: ZANAFLEX Take 2 mg by mouth 3 (three) times daily as needed for muscle spasms.   traMADol 50 MG tablet Commonly known as: ULTRAM Take 1 tablet (50 mg total) by mouth every 6 (six) hours as needed. What changed: reasons to take this   vitamin C 500 MG tablet Commonly known as: ASCORBIC ACID Take 1,000 mg by mouth daily.   vitamin C 1000 MG tablet Take 1,000 mg by mouth daily.   zinc gluconate 50 MG tablet Take 50 mg by mouth daily.        Follow-up Information     Merrilee Seashore, MD Follow up in 1 week(s).   Specialty: Internal Medicine Contact information: [address] [place] [address] [phone]                Allergies  Allergen Reactions   Codeine     Nausea/vomiting    Sulfa Antibiotics Nausea And Vomiting      Consultations:   None   Procedures/Studies:   X-ray chest PA and lateral  Result Date: 06/07/2021 CLINICAL DATA:  Dizziness. EXAM: CHEST - 2 VIEW COMPARISON:  December 02, 2010 FINDINGS: Chronic appearing increased lung markings are seen without evidence of acute infiltrate, pleural effusion or pneumothorax. There is mild, stable elevation of the right hemidiaphragm. The heart size and mediastinal contours are within normal limits. A radiopaque fusion plate and screws are seen overlying the cervical spine. Degenerative changes are noted throughout the thoracic spine and bilateral shoulders. IMPRESSION: Chronic appearing increased lung markings without evidence of acute or active cardiopulmonary disease. Electronically Signed   By: Virgina Norfolk M.D.   On: 06/07/2021 16:40   CT Head Wo Contrast  Result Date: 06/07/2021 CLINICAL DATA:  History of colon cancer with decreased blood pressure. EXAM: CT HEAD WITHOUT CONTRAST TECHNIQUE: Contiguous axial images were obtained from the base of the skull through the vertex without  intravenous contrast. COMPARISON:  None. FINDINGS: Brain: There is mild cerebral atrophy with widening of the extra-axial spaces and ventricular dilatation. There are areas of decreased attenuation within the white matter tracts of the supratentorial brain, consistent with microvascular disease changes. Bilateral subcentimeter basal ganglia lacunar infarcts are noted. Vascular: No hyperdense vessel or unexpected calcification. Skull: Normal. Negative for fracture or focal lesion. Sinuses/Orbits: There is mild to moderate severity bilateral ethmoid sinus mucosal thickening. A small left ethmoid sinus osteoma is noted. Other: A 7 mm benign-appearing soft tissue calcification is seen within the right frontal scalp. IMPRESSION: 1. Generalized cerebral atrophy without an acute intracranial abnormality. 2. Bilateral subcentimeter basal ganglia lacunar infarcts. 3. Mild to moderate severity bilateral ethmoid sinus disease. Electronically Signed   By: Virgina Norfolk M.D.   On: 06/07/2021 15:35   CT CHEST ABDOMEN PELVIS W CONTRAST  Result Date: 05/19/2021 CLINICAL DATA:  Colorectal cancer staging. EXAM: CT CHEST, ABDOMEN, AND PELVIS WITH CONTRAST  TECHNIQUE: Multidetector CT imaging of the chest, abdomen and pelvis was performed following the standard protocol during bolus administration of intravenous contrast. CONTRAST:  69mL OMNIPAQUE IOHEXOL 350 MG/ML SOLN COMPARISON:  02/24/2014 FINDINGS: CT CHEST FINDINGS Cardiovascular: The heart is normal in size. No pericardial effusion. The aorta demonstrates moderate tortuosity, mild ectasia and moderate atherosclerotic calcifications. Dense three-vessel coronary artery calcifications are noted. Mediastinum/Nodes: No mediastinal or hilar mass or adenopathy. The esophagus is grossly normal. There is a complex 4.5 x 4.2 cm mass associated with the left thyroid lobe. This has been evaluated on previous imaging. (ref: J Am Coll Radiol. 2015 Feb;12(2): 143-50). Lungs/Pleura: No  acute pulmonary findings. No worrisome pulmonary lesions. No pulmonary nodules to suggest pulmonary metastatic disease. Musculoskeletal: Severe degenerative changes involving both shoulders with joint effusions and large bursal fluid collections. No acute bony findings or destructive bony changes. Cervical spine fusion hardware is noted. Advanced degenerative changes involving the thoracolumbar spine and significant scoliosis. CT ABDOMEN PELVIS FINDINGS Hepatobiliary: No hepatic lesions to suggest metastatic disease. No intrahepatic biliary dilatation. The gallbladder is grossly normal. No common bile duct dilatation. Pancreas: No mass, inflammation or ductal dilatation. Spleen: Normal size.  No focal lesions. Adrenals/Urinary Tract: Adrenal glands and kidneys are grossly normal. No worrisome renal lesions or hydronephrosis. The bladder is unremarkable. Stomach/Bowel: The stomach, duodenum and small bowel are unremarkable. There is an apple-core type neoplasm involving the ascending colon. No findings to suggest tumor invasion of the pericolonic fat. No adjacent ileocolic adenopathy. The remainder of the colon is grossly normal. Vascular/Lymphatic: Advanced atherosclerotic calcifications involving the aorta and iliac arteries and branch vessels but no aneurysm. Major venous structures are patent. No mesenteric or retroperitoneal mass or adenopathy. Reproductive: Surgically absent. Other: There is a 3.5 cm cystic structure in the subcutaneous fat overlying the left abdominal oblique muscles. This is likely a benign subcutaneous cyst. No significant change since the prior study 2015. Musculoskeletal: No significant bony findings. Stable degenerative changes involving the spine hips. No worrisome bone lesions. IMPRESSION: 1. 2.5 cm apple-core type ascending colon lesion consistent with known colon cancer. No findings for local tumor invasion of the pericolonic fat or adjacent ileocolic adenopathy. 2. No findings for  metastatic disease involving the chest, abdomen or pelvis. 3. Advanced atherosclerotic calcifications involving the thoracic and abdominal aorta and branch vessels including the coronary arteries. 4. Severe degenerative changes involving both shoulders with joint effusions and large bursal fluid collections. 5. Stable complex left thyroid lobe mass, previously addressed. 6. Aortic atherosclerosis. Aortic Atherosclerosis (ICD10-I70.0). Electronically Signed   By: Marijo Sanes M.D.   On: 05/19/2021 21:41   ECHOCARDIOGRAM COMPLETE  Result Date: 06/08/2021    ECHOCARDIOGRAM REPORT   Patient Name:   GENAVIVE KUBICKI Date of Exam: 06/08/2021 Medical Rec #:  [id number]       Height:       61.0 in Accession #:    [id number]      Weight:       152.1 lb Date of Birth:  [birth year]       BSA:          1.681 m Patient Age:    36 years        BP:           194/93 mmHg Patient Gender: F               HR:           80 bpm. Exam Location:  Inpatient Procedure: 2D Echo,  Color Doppler and Cardiac Doppler Indications:    R55 Syncope  History:        Patient has no prior history of Echocardiogram examinations.                 Risk Factors:Hypertension and Dyslipidemia.  Sonographer:    Raquel Sarna Senior RDCS Referring Phys: [id number] [place]  1. Left ventricular ejection fraction, by estimation, is 60 to 65%. The left ventricle has normal function. The left ventricle has no regional wall motion abnormalities. Left ventricular diastolic parameters are consistent with Grade II diastolic dysfunction (pseudonormalization).  2. Right ventricular systolic function is normal. The right ventricular size is normal. Tricuspid regurgitation signal is inadequate for assessing PA pressure.  3. Left atrial size was moderately dilated.  4. The mitral valve is normal in structure. Trivial mitral valve regurgitation. No evidence of mitral stenosis.  5. The aortic valve was not well visualized. Aortic valve regurgitation is not  visualized. Mild aortic valve sclerosis is present, with no evidence of aortic valve stenosis.  6. The inferior vena cava is dilated in size with <50% respiratory variability, suggesting right atrial pressure of 15 mmHg. Comparison(s): No prior Echocardiogram. FINDINGS  Left Ventricle: Left ventricular ejection fraction, by estimation, is 60 to 65%. The left ventricle has normal function. The left ventricle has no regional wall motion abnormalities. The left ventricular internal cavity size was normal in size. There is  no left ventricular hypertrophy. Left ventricular diastolic parameters are consistent with Grade II diastolic dysfunction (pseudonormalization). Right Ventricle: The right ventricular size is normal. No increase in right ventricular wall thickness. Right ventricular systolic function is normal. Tricuspid regurgitation signal is inadequate for assessing PA pressure. Left Atrium: Left atrial size was moderately dilated. Right Atrium: Right atrial size was normal in size. Pericardium: There is no evidence of pericardial effusion. Mitral Valve: The mitral valve is normal in structure. Trivial mitral valve regurgitation. No evidence of mitral valve stenosis. Tricuspid Valve: The tricuspid valve is normal in structure. Tricuspid valve regurgitation is trivial. No evidence of tricuspid stenosis. Aortic Valve: Likely tricuspid with calcification. The aortic valve was not well visualized. Aortic valve regurgitation is not visualized. Mild aortic valve sclerosis is present, with no evidence of aortic valve stenosis. Pulmonic Valve: The pulmonic valve was not well visualized. Pulmonic valve regurgitation is not visualized. No evidence of pulmonic stenosis. Aorta: The aortic root and ascending aorta are structurally normal, with no evidence of dilitation. Venous: The inferior vena cava is dilated in size with less than 50% respiratory variability, suggesting right atrial pressure of 15 mmHg. IAS/Shunts: The  atrial septum is grossly normal.  LEFT VENTRICLE PLAX 2D LVIDd:         4.00 cm   Diastology LVIDs:         2.50 cm   LV e' medial:    6.31 cm/s LV PW:         1.00 cm   LV E/e' medial:  17.9 LV IVS:        0.90 cm   LV e' lateral:   8.05 cm/s LVOT diam:     1.90 cm   LV E/e' lateral: 14.0 LV SV:         70 LV SV Index:   42 LVOT Area:     2.84 cm  RIGHT VENTRICLE RV S prime:     17.50 cm/s TAPSE (M-mode): 2.7 cm LEFT ATRIUM  Index        RIGHT ATRIUM           Index LA diam:        4.30 cm 2.56 cm/m   RA Area:     15.20 cm LA Vol (A2C):   81.6 ml 48.53 ml/m  RA Volume:   36.60 ml  21.77 ml/m LA Vol (A4C):   64.5 ml 38.36 ml/m LA Biplane Vol: 75.9 ml 45.14 ml/m  AORTIC VALVE LVOT Vmax:   117.00 cm/s LVOT Vmean:  78.900 cm/s LVOT VTI:    0.248 m  AORTA Ao Root diam: 3.10 cm Ao Asc diam:  3.10 cm MITRAL VALVE MV Area (PHT): 3.39 cm     SHUNTS MV Decel Time: 224 msec     Systemic VTI:  0.25 m MV E velocity: 113.00 cm/s  Systemic Diam: 1.90 cm MV A velocity: 134.00 cm/s MV E/A ratio:  0.84 Rudean Haskell MD Electronically signed by Rudean Haskell MD Signature Date/Time: 06/08/2021/8:43:11 AM    Final    VAS US CAROTID  Result Date: 06/08/2021 Carotid Arterial Duplex Study Patient Name:  BRINAE WOODS  Date of Exam:   06/08/2021 Medical Rec #: [id number]        Accession #:    [id number] Date of Birth: [birth year]        Patient Gender: F Patient Age:   11 years Exam Location:  [facility] Procedure:      VAS US CAROTID Referring Phys: DAVID ORTIZ --------------------------------------------------------------------------------  Indications:       Syncope. Risk Factors:      Hypertension, hyperlipidemia. Comparison Study:  No prior studies. Performing Technologist: Oliver Hum RVT  Examination Guidelines: A complete evaluation includes B-mode imaging, spectral Doppler, color Doppler, and power Doppler as needed of all accessible portions of each vessel. Bilateral testing is  considered an integral part of a complete examination. Limited examinations for reoccurring indications may be performed as noted.  Right Carotid Findings: +----------+--------+--------+--------+-----------------------+--------+           PSV cm/sEDV cm/sStenosisPlaque Description     Comments +----------+--------+--------+--------+-----------------------+--------+ CCA Prox  71      12              smooth and heterogenoustortuous +----------+--------+--------+--------+-----------------------+--------+ CCA Distal96      20              smooth and heterogenous         +----------+--------+--------+--------+-----------------------+--------+ ICA Prox  78      22              smooth and heterogenoustortuous +----------+--------+--------+--------+-----------------------+--------+ ICA Distal58      15                                     tortuous +----------+--------+--------+--------+-----------------------+--------+ ECA       157     18                                              +----------+--------+--------+--------+-----------------------+--------+ +----------+--------+-------+--------+-------------------+           PSV cm/sEDV cmsDescribeArm Pressure (mmHG) +----------+--------+-------+--------+-------------------+ [id number]                                         +----------+--------+-------+--------+-------------------+ +---------+--------+--+--------+--+---------+  VertebralPSV cm/s53EDV cm/s12Antegrade +---------+--------+--+--------+--+---------+  Left Carotid Findings: +----------+--------+--------+--------+-----------------------+--------+           PSV cm/sEDV cm/sStenosisPlaque Description     Comments +----------+--------+--------+--------+-----------------------+--------+ CCA Prox  74      17              smooth and heterogenous         +----------+--------+--------+--------+-----------------------+--------+ CCA Distal60       15              smooth and heterogenous         +----------+--------+--------+--------+-----------------------+--------+ ICA Prox  66      17              smooth and heterogenous         +----------+--------+--------+--------+-----------------------+--------+ ICA Distal75      23                                              +----------+--------+--------+--------+-----------------------+--------+ ECA       94      21                                              +----------+--------+--------+--------+-----------------------+--------+ +----------+--------+--------+--------+-------------------+           PSV cm/sEDV cm/sDescribeArm Pressure (mmHG) +----------+--------+--------+--------+-------------------+ [id number]                                          +----------+--------+--------+--------+-------------------+ +---------+--------+--+--------+--+---------+ VertebralPSV cm/s57EDV cm/s19Antegrade +---------+--------+--+--------+--+---------+   Summary: Right Carotid: Velocities in the right ICA are consistent with a 1-39% stenosis. Left Carotid: Velocities in the left ICA are consistent with a 1-39% stenosis. Vertebrals: Bilateral vertebral arteries demonstrate antegrade flow. *See table(s) above for measurements and observations.     Preliminary        Discharge Exam:   Vitals:   06/08/21 1130 06/08/21 1145  BP:    Pulse: 98 98  Resp: 16 17  Temp:    SpO2: 99% 98%   Vitals:   06/08/21 1000 06/08/21 1030 06/08/21 1130 06/08/21 1145  BP: (!) 181/87 (!) 195/87    Pulse: 97 74 98 98  Resp: (!) 21 13 16 17   Temp:      TempSrc:      SpO2: 98% 96% 99% 98%  Weight:      Height:        General: Pt is alert, awake, not in acute distress Cardiovascular: RRR, S1/S2 +, no rubs, no gallops Respiratory: CTA bilaterally, no wheezing, no rhonchi Abdominal: Soft, NT, ND, bowel sounds + Extremities: no edema, no cyanosis    The results of significant  diagnostics from this hospitalization (including imaging, microbiology, ancillary and laboratory) are listed below for reference.     Microbiology:   Recent Results (from the past 240 hour(s))  Resp Panel by RT-PCR (Flu A&B, Covid) Nasopharyngeal Swab     Status: None   Collection Time: 06/07/21  8:13 PM   Specimen: Nasopharyngeal Swab; Nasopharyngeal(NP) swabs in vial transport medium  Result Value Ref Range Status   SARS Coronavirus 2 by RT PCR NEGATIVE NEGATIVE Final    Comment: (NOTE) SARS-CoV-2 target nucleic acids are  NOT DETECTED.  The SARS-CoV-2 RNA is generally detectable in upper respiratory specimens during the acute phase of infection. The lowest concentration of SARS-CoV-2 viral copies this assay can detect is 138 copies/mL. A negative result does not preclude SARS-Cov-2 infection and should not be used as the sole basis for treatment or other patient management decisions. A negative result may occur with  improper specimen collection/handling, submission of specimen other than nasopharyngeal swab, presence of viral mutation(s) within the areas targeted by this assay, and inadequate number of viral copies(<138 copies/mL). A negative result must be combined with clinical observations, patient history, and epidemiological information. The expected result is Negative.  Fact Sheet for Patients:  EntrepreneurPulse.com.au  Fact Sheet for Healthcare Providers:  IncredibleEmployment.be  This test is no t yet approved or cleared by the [place] FDA and  has been authorized for detection and/or diagnosis of SARS-CoV-2 by FDA under an Emergency Use Authorization (EUA). This EUA will remain  in effect (meaning this test can be used) for the duration of the COVID-19 declaration under Section 564(b)(1) of the Act, 21 U.S.C.section 360bbb-3(b)(1), unless the authorization is terminated  or revoked sooner.       Influenza A by PCR  NEGATIVE NEGATIVE Final   Influenza B by PCR NEGATIVE NEGATIVE Final    Comment: (NOTE) The Xpert Xpress SARS-CoV-2/FLU/RSV plus assay is intended as an aid in the diagnosis of influenza from Nasopharyngeal swab specimens and should not be used as a sole basis for treatment. Nasal washings and aspirates are unacceptable for Xpert Xpress SARS-CoV-2/FLU/RSV testing.  Fact Sheet for Patients: EntrepreneurPulse.com.au  Fact Sheet for Healthcare Providers: IncredibleEmployment.be  This test is not yet approved or cleared by the [place] FDA and has been authorized for detection and/or diagnosis of SARS-CoV-2 by FDA under an Emergency Use Authorization (EUA). This EUA will remain in effect (meaning this test can be used) for the duration of the COVID-19 declaration under Section 564(b)(1) of the Act, 21 U.S.C. section 360bbb-3(b)(1), unless the authorization is terminated or revoked.  Performed at [facility], [place] [address], [place], [place] [address]        Labs:   BNP (last 3 results) No results for input(s): BNP in the last 8760 hours. Basic Metabolic Panel: Recent Labs  Lab 06/07/21 1335 06/08/21 0604  NA 135 136  K 3.9 3.5  CL 107 108  CO2 20* 21*  GLUCOSE 101* 92  BUN 29* 21  CREATININE 1.04* 0.79  CALCIUM 8.8* 9.0   Liver Function Tests: Recent Labs  Lab 06/07/21 1335 06/08/21 0604  AST 16 12*  ALT 38 8  ALKPHOS 53 53  BILITOT 0.8 0.8  PROT 6.4* 5.9*  ALBUMIN 3.4* 3.1*   No results for input(s): LIPASE, AMYLASE in the last 168 hours. No results for input(s): AMMONIA in the last 168 hours. CBC: Recent Labs  Lab 06/07/21 1335 06/08/21 0604  WBC 10.3 5.9  NEUTROABS 8.6*  --   HGB 7.9* 7.8*  HCT 25.5* 24.7*  MCV 87.6 86.4  PLT 326 326   Cardiac Enzymes: No results for input(s): CKTOTAL, CKMB, CKMBINDEX, TROPONINI in the last 168 hours. BNP: Invalid input(s): POCBNP CBG: No results  for input(s): GLUCAP in the last 168 hours. D-Dimer No results for input(s): DDIMER in the last 72 hours. Hgb A1c No results for input(s): HGBA1C in the last 72 hours. Lipid Profile No results for input(s): CHOL, HDL, LDLCALC, TRIG, CHOLHDL, LDLDIRECT in the last 72 hours. Thyroid function  studies No results for input(s): TSH, T4TOTAL, T3FREE, THYROIDAB in the last 72 hours.  Invalid input(s): FREET3 Anemia work up Recent Labs    06/08/21 0940  FERRITIN 32  TIBC 351  IRON 21*   Urinalysis    Component Value Date/Time   COLORURINE YELLOW 12/02/2010 [place] 12/02/2010 1205   LABSPEC 1.014 12/02/2010 1205   PHURINE 5.0 12/02/2010 1205   GLUCOSEU NEGATIVE 12/02/2010 1205   [place] 12/02/2010 1205   [place] 12/02/2010 1205   [place] 12/02/2010 1205   PROTEINUR NEGATIVE 12/02/2010 1205   UROBILINOGEN 0.2 12/02/2010 1205   NITRITE NEGATIVE 12/02/2010 1205   LEUKOCYTESUR SMALL (A) 12/02/2010 1205   Sepsis Labs Invalid input(s): PROCALCITONIN,  WBC,  LACTICIDVEN Microbiology Recent Results (from the past 240 hour(s))  Resp Panel by RT-PCR (Flu A&B, Covid) Nasopharyngeal Swab     Status: None   Collection Time: 06/07/21  8:13 PM   Specimen: Nasopharyngeal Swab; Nasopharyngeal(NP) swabs in vial transport medium  Result Value Ref Range Status   SARS Coronavirus 2 by RT PCR NEGATIVE NEGATIVE Final    Comment: (NOTE) SARS-CoV-2 target nucleic acids are NOT DETECTED.  The SARS-CoV-2 RNA is generally detectable in upper respiratory specimens during the acute phase of infection. The lowest concentration of SARS-CoV-2 viral copies this assay can detect is 138 copies/mL. A negative result does not preclude SARS-Cov-2 infection and should not be used as the sole basis for treatment or other patient management decisions. A negative result may occur with  improper specimen collection/handling, submission of specimen other than  nasopharyngeal swab, presence of viral mutation(s) within the areas targeted by this assay, and inadequate number of viral copies(<138 copies/mL). A negative result must be combined with clinical observations, patient history, and epidemiological information. The expected result is Negative.  Fact Sheet for Patients:  EntrepreneurPulse.com.au  Fact Sheet for Healthcare Providers:  IncredibleEmployment.be  This test is no t yet approved or cleared by the [place] FDA and  has been authorized for detection and/or diagnosis of SARS-CoV-2 by FDA under an Emergency Use Authorization (EUA). This EUA will remain  in effect (meaning this test can be used) for the duration of the COVID-19 declaration under Section 564(b)(1) of the Act, 21 U.S.C.section 360bbb-3(b)(1), unless the authorization is terminated  or revoked sooner.       Influenza A by PCR NEGATIVE NEGATIVE Final   Influenza B by PCR NEGATIVE NEGATIVE Final    Comment: (NOTE) The Xpert Xpress SARS-CoV-2/FLU/RSV plus assay is intended as an aid in the diagnosis of influenza from Nasopharyngeal swab specimens and should not be used as a sole basis for treatment. Nasal washings and aspirates are unacceptable for Xpert Xpress SARS-CoV-2/FLU/RSV testing.  Fact Sheet for Patients: EntrepreneurPulse.com.au  Fact Sheet for Healthcare Providers: IncredibleEmployment.be  This test is not yet approved or cleared by the [place] FDA and has been authorized for detection and/or diagnosis of SARS-CoV-2 by FDA under an Emergency Use Authorization (EUA). This EUA will remain in effect (meaning this test can be used) for the duration of the COVID-19 declaration under Section 564(b)(1) of the Act, 21 U.S.C. section 360bbb-3(b)(1), unless the authorization is terminated or revoked.  Performed at [facility], [place] [address], [place], [place] [address]      Time coordinating discharge: 45 minutes  SIGNED:   Leslee Home, MD  [facility] 06/08/2021, 12:06 PM Pager   If 7PM-7AM, please contact night-coverage [email or web address] Password [email or web address]

## 2021-06-08 NOTE — Progress Notes (Signed)
Echocardiogram 2D Echocardiogram has been performed.  Oneal Deputy Urijah Arko RDCS 06/08/2021, 8:03 AM

## 2021-06-08 NOTE — Discharge Instructions (Addendum)
Follow up with PCP in 1 week Your Lasix was changed to as needed Lisinopril was reduced to 40 mg daily Start amlodipine 5 mg daily Start iron 325 mg daily Have your PCP recheck iron studies and H/H in 4 weeks

## 2021-06-08 NOTE — ED Notes (Signed)
Pt slid up in bed.

## 2021-06-08 NOTE — Progress Notes (Signed)
Carotid artery duplex has been completed. Preliminary results can be found in CV Proc through chart review.   06/08/21 10:38 AM Cindy Patterson RVT

## 2021-06-08 NOTE — ED Notes (Signed)
Pt given meal tray.

## 2021-06-08 NOTE — ED Notes (Signed)
Pt declines PRN BP meds at this time and states that she wants to rest

## 2021-06-08 NOTE — ED Notes (Signed)
Changed pt's gown, bed pad, and Purewick. Provided clean brief and assisted pt in repositioning.

## 2021-06-09 LAB — SOLUBLE TRANSFERRIN RECEPTOR: Transferrin Receptor: 21.3 nmol/L (ref 12.2–27.3)

## 2021-06-14 ENCOUNTER — Inpatient Hospital Stay: Payer: Medicare Other

## 2021-06-15 DIAGNOSIS — E782 Mixed hyperlipidemia: Secondary | ICD-10-CM | POA: Diagnosis not present

## 2021-06-15 DIAGNOSIS — N1831 Chronic kidney disease, stage 3a: Secondary | ICD-10-CM | POA: Diagnosis not present

## 2021-06-15 DIAGNOSIS — I10 Essential (primary) hypertension: Secondary | ICD-10-CM | POA: Diagnosis not present

## 2021-06-15 DIAGNOSIS — D500 Iron deficiency anemia secondary to blood loss (chronic): Secondary | ICD-10-CM | POA: Diagnosis not present

## 2021-06-16 DIAGNOSIS — D500 Iron deficiency anemia secondary to blood loss (chronic): Secondary | ICD-10-CM | POA: Diagnosis not present

## 2021-06-21 ENCOUNTER — Inpatient Hospital Stay (HOSPITAL_BASED_OUTPATIENT_CLINIC_OR_DEPARTMENT_OTHER): Payer: Medicare Other | Admitting: Nurse Practitioner

## 2021-06-21 ENCOUNTER — Inpatient Hospital Stay: Payer: Medicare Other

## 2021-06-21 ENCOUNTER — Encounter: Payer: Self-pay | Admitting: Hematology

## 2021-06-21 ENCOUNTER — Encounter: Payer: Self-pay | Admitting: Nurse Practitioner

## 2021-06-21 ENCOUNTER — Other Ambulatory Visit: Payer: Self-pay

## 2021-06-21 VITALS — BP 154/74 | HR 98 | Temp 98.4°F | Resp 17 | Wt 148.2 lb

## 2021-06-21 DIAGNOSIS — Z79899 Other long term (current) drug therapy: Secondary | ICD-10-CM | POA: Diagnosis not present

## 2021-06-21 DIAGNOSIS — D649 Anemia, unspecified: Secondary | ICD-10-CM | POA: Diagnosis not present

## 2021-06-21 DIAGNOSIS — Z1231 Encounter for screening mammogram for malignant neoplasm of breast: Secondary | ICD-10-CM

## 2021-06-21 DIAGNOSIS — Z7982 Long term (current) use of aspirin: Secondary | ICD-10-CM | POA: Insufficient documentation

## 2021-06-21 DIAGNOSIS — Z87891 Personal history of nicotine dependence: Secondary | ICD-10-CM | POA: Insufficient documentation

## 2021-06-21 DIAGNOSIS — I2510 Atherosclerotic heart disease of native coronary artery without angina pectoris: Secondary | ICD-10-CM | POA: Diagnosis not present

## 2021-06-21 DIAGNOSIS — C182 Malignant neoplasm of ascending colon: Secondary | ICD-10-CM | POA: Insufficient documentation

## 2021-06-21 DIAGNOSIS — I10 Essential (primary) hypertension: Secondary | ICD-10-CM | POA: Diagnosis not present

## 2021-06-21 DIAGNOSIS — K219 Gastro-esophageal reflux disease without esophagitis: Secondary | ICD-10-CM | POA: Insufficient documentation

## 2021-06-21 NOTE — Progress Notes (Addendum)
[place]   [phone] [phone]   Clinic Follow up Note   Patient Care Team: Merrilee Seashore, MD as PCP - General (Internal Medicine) Merrilee Seashore, MD (Internal Medicine) 06/21/2021  CHIEF COMPLAINT: Stage I colon cancer  SUMMARY OF ONCOLOGIC HISTORY: Oncology History  Primary adenocarcinoma of ascending colon ([place])  05/19/2021 Procedure   GI work-up by Dr. Collene Mares  EGD impression - Normal appearing, widely patent esophagus and GEJ. - Normal appearing stomach. - Normal examined duodenum. - No specimens collected.  Colonoscopy impression  -an ulcerated, partially obstructing, circunferential mass was found in the proximal ascending colon-this was biopsied; I was unable to pass the scope through the mas sinto the cecum. No additional abnormalities were found on retroflexion.   05/19/2021 Initial Biopsy   FINAL MICROSCOPIC DIAGNOSIS:   A. COLON, PROXIMAL RIGHT, ASCENDING, BIOPSY:  - Adenocarcinoma.   MMR - abnormal IHC EXPRESSION RESULTS  TEST           RESULT  MLH1:          LOSS OF NUCLEAR EXPRESSION  MSH2:          Preserved nuclear expression  MSH6:          Preserved nuclear expression  PMS2:          LOSS OF NUCLEAR EXPRESSION   Microsatellite instability high  MLH1 hypermethylation present   05/19/2021 Imaging   Staging CT CAP IMPRESSION: 1. 2.5 cm apple-core type ascending colon lesion consistent with known colon cancer. No findings for local tumor invasion of the pericolonic fat or adjacent ileocolic adenopathy. 2. No findings for metastatic disease involving the chest, abdomen or pelvis. 3. Advanced atherosclerotic calcifications involving the thoracic and abdominal aorta and branch vessels including the coronary arteries. 4. Severe degenerative changes involving both shoulders with joint effusions and large bursal fluid collections. 5. Stable complex left thyroid lobe mass, previously addressed. 6. Aortic  atherosclerosis.   05/21/2021 Cancer Staging   Staging form: Colon and Rectum, AJCC 8th Edition - Pathologic stage from 05/21/2021: Stage I (pT1, pN0, cM0) - Signed by Alla Feeling, NP on 06/07/2021 Stage prefix: Initial diagnosis Total positive nodes: 0 Histologic grading system: 4 grade system Histologic grade (G): G2 Laterality: Right Carcinoembryonic antigen (CEA) (ng/mL): 2.6 Microsatellite instability (MSI): Unstable high    05/21/2021 Definitive Surgery   FINAL MICROSCOPIC DIAGNOSIS:   A. COLON, RIGHT WITH TERMIAL ILEUM, RESECTION:  - Invasive moderately differentiated adenocarcinoma, 2.5 cm, involving  ascending colon  - Carcinoma invades into submucosa, see comment  - Resection margins are negative for carcinoma  - Nineteen lymph nodes, negative for carcinoma (0/19)  Pathologic Stage Classification (pTNM, AJCC 8th Edition): pT1, pN0    06/07/2021 Initial Diagnosis   Primary adenocarcinoma of ascending colon ([facility])     CURRENT THERAPY: Surveillance  INTERVAL HISTORY: Ms. Hillebrand returns for follow-up as scheduled.  She initially presented to [place] on 06/07/2021 for new patient consult but was transported to ED for syncopal episode.  Work-up including echo and EKG were unremarkable, orthostatics reportedly normal, CT head and chest x-ray were negative.  She was given fluids, BP meds were adjusted and she was started on iron supplementation.  Syncopal episode was felt to be vasovagal.  She presents today by herself.  She feels much better.  BP meds have been adjusted and she is taking Lasix as needed.  Bowels moving with Metamucil, she has very mild occasional pain at the right lower abdomen.  She does not  take medication.  She has seen Dr. Zenia Resides.  Energy and appetite are normal.  Denies recent fever or signs of infection.    MEDICAL HISTORY:  Past Medical History:  Diagnosis Date   Acid reflux    Back pain    Coronary artery disease    Diverticulosis    Essential  hypertension    GERD (gastroesophageal reflux disease)    Hyperlipidemia    Hypertension    Osteoarthritis    Umbilical hernia     SURGICAL HISTORY: Past Surgical History:  Procedure Laterality Date   ABDOMINAL HYSTERECTOMY     total vaginal   ABDOMINOPLASTY     APPENDECTOMY     BIOPSY  05/19/2021   Procedure: BIOPSY;  Surgeon: [name], MD;  Location: [facility];  Service: Endoscopy;;   COLONOSCOPY WITH PROPOFOL N/A 05/19/2021   Procedure: COLONOSCOPY WITH PROPOFOL;  Surgeon: [name], MD;  Location: [facility];  Service: Endoscopy;  Laterality: N/A;   DECOMPRESSIVE LUMBAR LAMINECTOMY LEVEL 1     DECOMPRESSIVE LUMBAR LAMINECTOMY LEVEL 4     ESOPHAGOGASTRODUODENOSCOPY (EGD) WITH PROPOFOL N/A 05/19/2021   Procedure: ESOPHAGOGASTRODUODENOSCOPY (EGD) WITH PROPOFOL;  Surgeon: [name], MD;  Location: [facility];  Service: Endoscopy;  Laterality: N/A;   HERNIA REPAIR     umb hernia repair   JOINT REPLACEMENT     LAPAROSCOPIC RIGHT HEMI COLECTOMY N/A 05/21/2021   Procedure: LAPAROSCOPIC ASSISTED RIGHT HEMI COLECTOMY;  Surgeon: Dwan Bolt, MD;  Location: [facility];  Service: General;  Laterality: N/A;   REPLACEMENT TOTAL KNEE BILATERAL     RHINOPLASTY     TONSILLECTOMY     TONSILLECTOMY AND ADENOIDECTOMY      I have reviewed the social history and family history with the patient and they are unchanged from previous note.  ALLERGIES:  is allergic to codeine and sulfa antibiotics.  MEDICATIONS:  Current Outpatient Medications  Medication Sig Dispense Refill   acetaminophen (TYLENOL) 500 MG tablet Take 1,000 mg by mouth every 6 (six) hours as needed for mild pain or moderate pain.     amLODipine (NORVASC) 5 MG tablet Take 1 tablet (5 mg total) by mouth daily. 30 tablet 0   Ascorbic Acid (VITAMIN C) 1000 MG tablet Take 1,000 mg by mouth daily.     atorvastatin (LIPITOR) 10 MG tablet Take 10 mg by mouth daily.     Cholecalciferol 125 MCG (5000 UT) capsule Take 5,000  Units by mouth daily.     ferrous sulfate 325 (65 FE) MG tablet Take 1 tablet (325 mg total) by mouth daily with breakfast. 30 tablet 0   furosemide (LASIX) 20 MG tablet Take 1 tablet (20 mg total) by mouth daily as needed for edema. Take with banana or OJ 30 tablet 0   lisinopril (ZESTRIL) 40 MG tablet Take 1 tablet (40 mg total) by mouth daily. 30 tablet 0   pantoprazole (PROTONIX) 40 MG tablet Take 40 mg by mouth daily.     pyridOXINE (VITAMIN B-6) 100 MG tablet Take 100 mg by mouth daily.     tiZANidine (ZANAFLEX) 2 MG tablet Take 2 mg by mouth 3 (three) times daily as needed for muscle spasms.     traMADol (ULTRAM) 50 MG tablet Take 1 tablet (50 mg total) by mouth every 6 (six) hours as needed. (Patient taking differently: Take 50 mg by mouth every 6 (six) hours as needed for moderate pain.) 20 tablet 0   vitamin C (ASCORBIC ACID) 500 MG tablet Take 1,000  mg by mouth daily.     zinc gluconate 50 MG tablet Take 50 mg by mouth daily.     No current facility-administered medications for this visit.    PHYSICAL EXAMINATION: ECOG PERFORMANCE STATUS: 1 - Symptomatic but completely ambulatory  Vitals:   06/21/21 1224  BP: (!) 154/74  Pulse: 98  Resp: 17  Temp: 98.4 F (36.9 C)  SpO2: 99%   Filed Weights   06/21/21 1224  Weight: 148 lb 4 oz (67.2 kg)    GENERAL:alert, no distress and comfortable SKIN: no rash  EYES: sclera clear NECK: without mass LYMPH:  no palpable cervical or supraclavicular lymphadenopathy  LUNGS: clear with normal breathing effort HEART: regular rate & rhythm, no lower extremity edema ABDOMEN:abdomen soft, non-tender and normal bowel sounds. Lap incisions healed NEURO: alert & oriented x 3 with fluent speech, no focal motor/sensory deficits  LABORATORY DATA:  I have reviewed the data as listed CBC Latest Ref Rng & Units 06/08/2021 06/07/2021 05/22/2021  WBC 4.0 - 10.5 K/uL 5.9 10.3 8.9  Hemoglobin 12.0 - 15.0 g/dL 7.8(L) 7.9(L) 8.2(L)  Hematocrit 36.0 -  46.0 % 24.7(L) 25.5(L) 25.4(L)  Platelets 150 - 400 K/uL 326 326 350     CMP Latest Ref Rng & Units 06/08/2021 06/07/2021 05/22/2021  Glucose 70 - 99 mg/dL 92 101(H) 91  BUN 8 - 23 mg/dL 21 29(H) 16  Creatinine 0.44 - 1.00 mg/dL 0.79 1.04(H) 0.90  Sodium 135 - 145 mmol/L 136 135 132(L)  Potassium 3.5 - 5.1 mmol/L 3.5 3.9 4.3  Chloride 98 - 111 mmol/L 108 107 104  CO2 22 - 32 mmol/L 21(L) 20(L) 23  Calcium 8.9 - 10.3 mg/dL 9.0 8.8(L) 8.9  Total Protein 6.5 - 8.1 g/dL 5.9(L) 6.4(L) -  Total Bilirubin 0.3 - 1.2 mg/dL 0.8 0.8 -  Alkaline Phos 38 - 126 U/L 53 53 -  AST 15 - 41 U/L 12(L) 16 -  ALT 0 - 44 U/L 8 38 -      RADIOGRAPHIC STUDIES: I have personally reviewed the radiological images as listed and agreed with the findings in the report. No results found.   ASSESSMENT & PLAN: 86 yo female   Adenocarcinoma of ascending colon, G2, pT1N0M0 stage I, MMR abnormal, MSI-High -we reviewed her colonoscopy, imaging, and surgical path in detail. She presented with change in bowel habits and hematochezia, work up showed ascending colon mass. Biopsy confirmed adenocarcinoma. Baseline CEA was normal, staging CT CAP was negative for distant metastasis.  -she underwent surgical resection by Dr. Zenia Resides on 05/21/21, final path showed 2.6 cm pT1 adenocarcinoma, lymph nodes and margins were negative.  -Molecular testing showed loss of expression in MLH1 and PMS2, MLH1 hypermethylation was present, suggesting somatic mutation. We discussed MSI-High disease carries better outcome. -due to her early stage colon cancer that was completely resected, she does not need adjuvant treatment. We recommend surveillance.  -We reviewed the 5 year surveillance plan including lab and exam every 4-6 months for first 2 years, then q6-12 months; a colonoscopy 1 year from surgery; and possibly a surveillance scan at 1 year from surgery.  She agrees.  -Ms. Parslow is clinically doing well. She has recovered well from surgery  1 month post-op. Bowels moving with metamucil. Pain is mild and well managed. There is no clinical evidence of complication or recurrence. -she is being treated for IDA by per PCP -we will see her back in 4 months, or sooner if needed. We reviewed signs and symptoms of  recurrence including unintentional weight loss, change in bowel habits, blood in stool, or abdominal pain/bloating.  -Patient seen with Dr. Burr Medico.  IDA -secondary to #1 -she was started on oral ferrous sulfate in 10/22, takes it every other day due to constipation  -s/p IV iron (Feraheme?) at PCP's office, will get second dose 10/26 -she is agreeable to have anemia labs checked in 2 months at PCP, will arrange additional iron if needed  Recent syncope -she presented 06/07/21 for initial new patient consult, but was found syncopal and transported to ED. She was admitted for observation.  -I reviewed her ED work up, Echo, EKG, carotid doppler, head CT, and chest xray were unremarkable -she was given IVF and BP meds were adjusted. Lisinopril reduced, lasix changed to PRN, and amlodipine was added -recovered well, no recurrent episodes. BP today 154/74  Co-morbidities - HTN, HL, CAD, GERD -per PCP  Health maintenance  -she is requesting order for screening mammogram, I referred her to breast center -I encouraged her to continue healthy active lifestyle, avoid smoking, limit alcohol, and stay up to date on vaccines and other age-appropriate health maintenance -she is eager to get back to aqua aerobics   PLAN: -Reviewed colon cancer w/up and ED course -begin colon cancer surveillance -will request to change dietician appt to phone visit -Continue IV iron and f/up anemia per PCP -Lab and surveillance visit in 4 months -Reviewed s/sx of recurrence -Screening mammo order placed   Orders Placed This Encounter  Procedures   MM 3D SCREEN BREAST BILATERAL    Standing Status:   Future    Standing Expiration Date:   06/21/2022     Order Specific Question:   Reason for Exam (SYMPTOM  OR DIAGNOSIS REQUIRED)    Answer:   routine screening, recent dx stage I colon cancer    Order Specific Question:   Preferred imaging location?    Answer:   [facility]    All questions were answered. The patient knows to call the clinic with any problems, questions or concerns. No barriers to learning were detected.     Alla Feeling, NP 06/21/21   Addendum  I have seen the patient, examined her. I agree with the assessment and and plan and have edited the notes.   I reviewed her surgical pathology findings with patient.  Given her stage I disease, and MSI high her prognosis is excellent. Will contact pathology regarding her reflex MLH1 promoter hypermethylation and BRAF mutation test results to see if this is sporadic versus H syndrome.  Her risk of recurrence is low, I recommend routine follow-up with lab and office visit, she may not need routine surveillance CT scan.  She is getting IV iron at her primary care physician's office.  She is feeling better overall.  We will see her back in 4 months for follow-up.  Truitt Merle  06/21/2021

## 2021-06-24 DIAGNOSIS — D500 Iron deficiency anemia secondary to blood loss (chronic): Secondary | ICD-10-CM | POA: Diagnosis not present

## 2021-06-28 ENCOUNTER — Ambulatory Visit: Payer: Medicare Other | Admitting: Nutrition

## 2021-06-28 ENCOUNTER — Telehealth: Payer: Self-pay | Admitting: Nutrition

## 2021-06-28 ENCOUNTER — Ambulatory Visit: Payer: No Typology Code available for payment source

## 2021-06-28 NOTE — Progress Notes (Signed)
Contacted patient by telephone.  She was not available.  Left message with name and phone number for return call.

## 2021-06-28 NOTE — Telephone Encounter (Signed)
Contacted patient by telephone.  She was not available.  Left name and phone number for return call.

## 2021-07-06 DIAGNOSIS — E782 Mixed hyperlipidemia: Secondary | ICD-10-CM | POA: Diagnosis not present

## 2021-07-06 DIAGNOSIS — D500 Iron deficiency anemia secondary to blood loss (chronic): Secondary | ICD-10-CM | POA: Diagnosis not present

## 2021-07-06 DIAGNOSIS — N1831 Chronic kidney disease, stage 3a: Secondary | ICD-10-CM | POA: Diagnosis not present

## 2021-07-06 DIAGNOSIS — I10 Essential (primary) hypertension: Secondary | ICD-10-CM | POA: Diagnosis not present

## 2021-08-09 ENCOUNTER — Other Ambulatory Visit: Payer: Self-pay

## 2021-08-09 ENCOUNTER — Ambulatory Visit
Admission: RE | Admit: 2021-08-09 | Discharge: 2021-08-09 | Disposition: A | Discharge status: Home / Self Care | Payer: Medicare Other | Source: Ambulatory Visit | Attending: Nurse Practitioner | Admitting: Nurse Practitioner

## 2021-08-09 DIAGNOSIS — Z1231 Encounter for screening mammogram for malignant neoplasm of breast: Secondary | ICD-10-CM | POA: Diagnosis not present

## 2021-08-10 DIAGNOSIS — E782 Mixed hyperlipidemia: Secondary | ICD-10-CM | POA: Diagnosis not present

## 2021-08-10 DIAGNOSIS — N1831 Chronic kidney disease, stage 3a: Secondary | ICD-10-CM | POA: Diagnosis not present

## 2021-08-10 DIAGNOSIS — I10 Essential (primary) hypertension: Secondary | ICD-10-CM | POA: Diagnosis not present

## 2021-08-10 DIAGNOSIS — D500 Iron deficiency anemia secondary to blood loss (chronic): Secondary | ICD-10-CM | POA: Diagnosis not present

## 2021-08-17 DIAGNOSIS — N390 Urinary tract infection, site not specified: Secondary | ICD-10-CM | POA: Diagnosis not present

## 2021-08-17 DIAGNOSIS — I10 Essential (primary) hypertension: Secondary | ICD-10-CM | POA: Diagnosis not present

## 2021-08-17 DIAGNOSIS — M8589 Other specified disorders of bone density and structure, multiple sites: Secondary | ICD-10-CM | POA: Diagnosis not present

## 2021-08-17 DIAGNOSIS — Z0000 Encounter for general adult medical examination without abnormal findings: Secondary | ICD-10-CM | POA: Diagnosis not present

## 2021-08-17 DIAGNOSIS — I700 Atherosclerosis of aorta: Secondary | ICD-10-CM | POA: Diagnosis not present

## 2021-08-17 DIAGNOSIS — D500 Iron deficiency anemia secondary to blood loss (chronic): Secondary | ICD-10-CM | POA: Diagnosis not present

## 2021-08-17 DIAGNOSIS — N1831 Chronic kidney disease, stage 3a: Secondary | ICD-10-CM | POA: Diagnosis not present

## 2021-08-17 DIAGNOSIS — Z780 Asymptomatic menopausal state: Secondary | ICD-10-CM | POA: Diagnosis not present

## 2021-08-17 DIAGNOSIS — E782 Mixed hyperlipidemia: Secondary | ICD-10-CM | POA: Diagnosis not present

## 2021-09-01 DIAGNOSIS — N390 Urinary tract infection, site not specified: Secondary | ICD-10-CM | POA: Diagnosis not present

## 2021-09-28 DIAGNOSIS — N1831 Chronic kidney disease, stage 3a: Secondary | ICD-10-CM | POA: Diagnosis not present

## 2021-09-28 DIAGNOSIS — K219 Gastro-esophageal reflux disease without esophagitis: Secondary | ICD-10-CM | POA: Diagnosis not present

## 2021-09-28 DIAGNOSIS — I10 Essential (primary) hypertension: Secondary | ICD-10-CM | POA: Diagnosis not present

## 2021-09-28 DIAGNOSIS — E782 Mixed hyperlipidemia: Secondary | ICD-10-CM | POA: Diagnosis not present

## 2021-10-26 ENCOUNTER — Other Ambulatory Visit: Payer: Self-pay | Admitting: Nurse Practitioner

## 2021-10-26 ENCOUNTER — Other Ambulatory Visit: Payer: Self-pay

## 2021-10-26 DIAGNOSIS — C182 Malignant neoplasm of ascending colon: Secondary | ICD-10-CM

## 2021-10-26 DIAGNOSIS — D500 Iron deficiency anemia secondary to blood loss (chronic): Secondary | ICD-10-CM

## 2021-10-26 NOTE — Progress Notes (Unsigned)
[place]   Telephone:(336) [phone] Fax:(336) [phone]   Clinic Follow up Note   Patient Care Team: Merrilee Seashore, MD as PCP - General (Internal Medicine) Merrilee Seashore, MD (Internal Medicine) 10/26/2021  CHIEF COMPLAINT: Follow-up stage I colon cancer  SUMMARY OF ONCOLOGIC HISTORY: Oncology History  Primary adenocarcinoma of ascending colon ([place])  05/19/2021 Procedure   GI work-up by Dr. Collene Mares  EGD impression - Normal appearing, widely patent esophagus and GEJ. - Normal appearing stomach. - Normal examined duodenum. - No specimens collected.  Colonoscopy impression  -an ulcerated, partially obstructing, circunferential mass was found in the proximal ascending colon-this was biopsied; I was unable to pass the scope through the mas sinto the cecum. No additional abnormalities were found on retroflexion.   05/19/2021 Initial Biopsy   FINAL MICROSCOPIC DIAGNOSIS:   A. COLON, PROXIMAL RIGHT, ASCENDING, BIOPSY:  - Adenocarcinoma.   MMR - abnormal IHC EXPRESSION RESULTS  TEST           RESULT  MLH1:          LOSS OF NUCLEAR EXPRESSION  MSH2:          Preserved nuclear expression  MSH6:          Preserved nuclear expression  PMS2:          LOSS OF NUCLEAR EXPRESSION   Microsatellite instability high  MLH1 hypermethylation present   05/19/2021 Imaging   Staging CT CAP IMPRESSION: 1. 2.5 cm apple-core type ascending colon lesion consistent with known colon cancer. No findings for local tumor invasion of the pericolonic fat or adjacent ileocolic adenopathy. 2. No findings for metastatic disease involving the chest, abdomen or pelvis. 3. Advanced atherosclerotic calcifications involving the thoracic and abdominal aorta and branch vessels including the coronary arteries. 4. Severe degenerative changes involving both shoulders with joint effusions and large bursal fluid collections. 5. Stable complex left thyroid lobe mass, previously addressed. 6.  Aortic atherosclerosis.   05/21/2021 Cancer Staging   Staging form: Colon and Rectum, AJCC 8th Edition - Pathologic stage from 05/21/2021: Stage I (pT1, pN0, cM0) - Signed by Alla Feeling, NP on 06/07/2021 Stage prefix: Initial diagnosis Total positive nodes: 0 Histologic grading system: 4 grade system Histologic grade (G): G2 Laterality: Right Carcinoembryonic antigen (CEA) (ng/mL): 2.6 Microsatellite instability (MSI): Unstable high    05/21/2021 Definitive Surgery   FINAL MICROSCOPIC DIAGNOSIS:   A. COLON, RIGHT WITH TERMIAL ILEUM, RESECTION:  - Invasive moderately differentiated adenocarcinoma, 2.5 cm, involving  ascending colon  - Carcinoma invades into submucosa, see comment  - Resection margins are negative for carcinoma  - Nineteen lymph nodes, negative for carcinoma (0/19)  Pathologic Stage Classification (pTNM, AJCC 8th Edition): pT1, pN0    06/07/2021 Initial Diagnosis   Primary adenocarcinoma of ascending colon ([place])     CURRENT THERAPY: Surveillance  INTERVAL HISTORY: Cindy Patterson returns for follow-up as scheduled, last seen by me 06/21/2021.   REVIEW OF SYSTEMS:   Constitutional: Denies fevers, chills or abnormal weight loss Eyes: Denies blurriness of vision Ears, nose, mouth, throat, and face: Denies mucositis or sore throat Respiratory: Denies cough, dyspnea or wheezes Cardiovascular: Denies palpitation, chest discomfort or lower extremity swelling Gastrointestinal:  Denies nausea, heartburn or change in bowel habits Skin: Denies abnormal skin rashes Lymphatics: Denies new lymphadenopathy or easy bruising Neurological:Denies numbness, tingling or new weaknesses Behavioral/Psych: Mood is stable, no new changes  All other systems were reviewed with the patient and are negative.  MEDICAL HISTORY:  Past Medical History:  Diagnosis Date   Acid reflux    Back pain    Coronary artery disease    Diverticulosis    Essential hypertension    GERD  (gastroesophageal reflux disease)    Hyperlipidemia    Hypertension    Osteoarthritis    Umbilical hernia     SURGICAL HISTORY: Past Surgical History:  Procedure Laterality Date   ABDOMINAL HYSTERECTOMY     total vaginal   ABDOMINOPLASTY     APPENDECTOMY     BIOPSY  05/19/2021   Procedure: BIOPSY;  Surgeon: [name], MD;  Location: [facility];  Service: Endoscopy;;   COLONOSCOPY WITH PROPOFOL N/A 05/19/2021   Procedure: COLONOSCOPY WITH PROPOFOL;  Surgeon: [name], MD;  Location: [facility];  Service: Endoscopy;  Laterality: N/A;   DECOMPRESSIVE LUMBAR LAMINECTOMY LEVEL 1     DECOMPRESSIVE LUMBAR LAMINECTOMY LEVEL 4     ESOPHAGOGASTRODUODENOSCOPY (EGD) WITH PROPOFOL N/A 05/19/2021   Procedure: ESOPHAGOGASTRODUODENOSCOPY (EGD) WITH PROPOFOL;  Surgeon: [name], MD;  Location: [facility];  Service: Endoscopy;  Laterality: N/A;   HERNIA REPAIR     umb hernia repair   JOINT REPLACEMENT     LAPAROSCOPIC RIGHT HEMI COLECTOMY N/A 05/21/2021   Procedure: LAPAROSCOPIC ASSISTED RIGHT HEMI COLECTOMY;  Surgeon: Dwan Bolt, MD;  Location: [facility];  Service: General;  Laterality: N/A;   REPLACEMENT TOTAL KNEE BILATERAL     RHINOPLASTY     TONSILLECTOMY     TONSILLECTOMY AND ADENOIDECTOMY      I have reviewed the social history and family history with the patient and they are unchanged from previous note.  ALLERGIES:  is allergic to codeine and sulfa antibiotics.  MEDICATIONS:  Current Outpatient Medications  Medication Sig Dispense Refill   acetaminophen (TYLENOL) 500 MG tablet Take 1,000 mg by mouth every 6 (six) hours as needed for mild pain or moderate pain.     amLODipine (NORVASC) 5 MG tablet Take 1 tablet (5 mg total) by mouth daily. 30 tablet 0   Ascorbic Acid (VITAMIN C) 1000 MG tablet Take 1,000 mg by mouth daily.     atorvastatin (LIPITOR) 10 MG tablet Take 10 mg by mouth daily.     Cholecalciferol 125 MCG (5000 UT) capsule Take 5,000 Units by mouth daily.      ferrous sulfate 325 (65 FE) MG tablet Take 1 tablet (325 mg total) by mouth daily with breakfast. 30 tablet 0   furosemide (LASIX) 20 MG tablet Take 1 tablet (20 mg total) by mouth daily as needed for edema. Take with banana or OJ 30 tablet 0   lisinopril (ZESTRIL) 40 MG tablet Take 1 tablet (40 mg total) by mouth daily. 30 tablet 0   pantoprazole (PROTONIX) 40 MG tablet Take 40 mg by mouth daily.     pyridOXINE (VITAMIN B-6) 100 MG tablet Take 100 mg by mouth daily.     tiZANidine (ZANAFLEX) 2 MG tablet Take 2 mg by mouth 3 (three) times daily as needed for muscle spasms.     traMADol (ULTRAM) 50 MG tablet Take 1 tablet (50 mg total) by mouth every 6 (six) hours as needed. (Patient taking differently: Take 50 mg by mouth every 6 (six) hours as needed for moderate pain.) 20 tablet 0   vitamin C (ASCORBIC ACID) 500 MG tablet Take 1,000 mg by mouth daily.     zinc gluconate 50 MG tablet Take 50 mg by mouth daily.     No current facility-administered medications for this visit.  PHYSICAL EXAMINATION: ECOG PERFORMANCE STATUS: {CHL ONC ECOG PS:[phone]}  There were no vitals filed for this visit. There were no vitals filed for this visit.  GENERAL:alert, no distress and comfortable SKIN: skin color, texture, turgor are normal, no rashes or significant lesions EYES: normal, Conjunctiva are pink and non-injected, sclera clear OROPHARYNX:no exudate, no erythema and lips, buccal mucosa, and tongue normal  NECK: supple, thyroid normal size, non-tender, without nodularity LYMPH:  no palpable lymphadenopathy in the cervical, axillary or inguinal LUNGS: clear to auscultation and percussion with normal breathing effort HEART: regular rate & rhythm and no murmurs and no lower extremity edema ABDOMEN:abdomen soft, non-tender and normal bowel sounds Musculoskeletal:no cyanosis of digits and no clubbing  NEURO: alert & oriented x 3 with fluent speech, no focal motor/sensory deficits  LABORATORY  DATA:  I have reviewed the data as listed CBC Latest Ref Rng & Units 06/08/2021 06/07/2021 05/22/2021  WBC 4.0 - 10.5 K/uL 5.9 10.3 8.9  Hemoglobin 12.0 - 15.0 g/dL 7.8(L) 7.9(L) 8.2(L)  Hematocrit 36.0 - 46.0 % 24.7(L) 25.5(L) 25.4(L)  Platelets 150 - 400 K/uL 326 326 350     CMP Latest Ref Rng & Units 06/08/2021 06/07/2021 05/22/2021  Glucose 70 - 99 mg/dL 92 101(H) 91  BUN 8 - 23 mg/dL 21 29(H) 16  Creatinine 0.44 - 1.00 mg/dL 0.79 1.04(H) 0.90  Sodium 135 - 145 mmol/L 136 135 132(L)  Potassium 3.5 - 5.1 mmol/L 3.5 3.9 4.3  Chloride 98 - 111 mmol/L 108 107 104  CO2 22 - 32 mmol/L 21(L) 20(L) 23  Calcium 8.9 - 10.3 mg/dL 9.0 8.8(L) 8.9  Total Protein 6.5 - 8.1 g/dL 5.9(L) 6.4(L) -  Total Bilirubin 0.3 - 1.2 mg/dL 0.8 0.8 -  Alkaline Phos 38 - 126 U/L 53 53 -  AST 15 - 41 U/L 12(L) 16 -  ALT 0 - 44 U/L 8 38 -      RADIOGRAPHIC STUDIES: I have personally reviewed the radiological images as listed and agreed with the findings in the report. No results found.   ASSESSMENT & PLAN:  No problem-specific Assessment & Plan notes found for this encounter.   No orders of the defined types were placed in this encounter.  All questions were answered. The patient knows to call the clinic with any problems, questions or concerns. No barriers to learning was detected. I spent {CHL ONC TIME VISIT - VTXLE:[id number]} counseling the patient face to face. The total time spent in the appointment was {CHL ONC TIME VISIT - YDSWV:[id number]} and more than 50% was on counseling and review of test results     Alla Feeling, NP 10/26/21

## 2021-10-27 ENCOUNTER — Inpatient Hospital Stay: Payer: Medicare Other | Attending: Nurse Practitioner

## 2021-10-27 ENCOUNTER — Inpatient Hospital Stay: Payer: Medicare Other | Admitting: Nurse Practitioner

## 2021-12-21 DIAGNOSIS — E782 Mixed hyperlipidemia: Secondary | ICD-10-CM | POA: Diagnosis not present

## 2021-12-21 DIAGNOSIS — I10 Essential (primary) hypertension: Secondary | ICD-10-CM | POA: Diagnosis not present

## 2021-12-21 DIAGNOSIS — D500 Iron deficiency anemia secondary to blood loss (chronic): Secondary | ICD-10-CM | POA: Diagnosis not present

## 2021-12-21 DIAGNOSIS — N390 Urinary tract infection, site not specified: Secondary | ICD-10-CM | POA: Diagnosis not present

## 2021-12-28 DIAGNOSIS — Z23 Encounter for immunization: Secondary | ICD-10-CM | POA: Diagnosis not present

## 2021-12-28 DIAGNOSIS — I10 Essential (primary) hypertension: Secondary | ICD-10-CM | POA: Diagnosis not present

## 2021-12-28 DIAGNOSIS — I700 Atherosclerosis of aorta: Secondary | ICD-10-CM | POA: Diagnosis not present

## 2021-12-28 DIAGNOSIS — E782 Mixed hyperlipidemia: Secondary | ICD-10-CM | POA: Diagnosis not present

## 2021-12-28 DIAGNOSIS — N1831 Chronic kidney disease, stage 3a: Secondary | ICD-10-CM | POA: Diagnosis not present

## 2022-01-26 DIAGNOSIS — N1831 Chronic kidney disease, stage 3a: Secondary | ICD-10-CM | POA: Diagnosis not present

## 2022-01-26 DIAGNOSIS — K219 Gastro-esophageal reflux disease without esophagitis: Secondary | ICD-10-CM | POA: Diagnosis not present

## 2022-01-26 DIAGNOSIS — I10 Essential (primary) hypertension: Secondary | ICD-10-CM | POA: Diagnosis not present

## 2022-01-26 DIAGNOSIS — E782 Mixed hyperlipidemia: Secondary | ICD-10-CM | POA: Diagnosis not present

## 2022-02-11 DIAGNOSIS — H10022 Other mucopurulent conjunctivitis, left eye: Secondary | ICD-10-CM | POA: Diagnosis not present

## 2022-04-28 DIAGNOSIS — N1831 Chronic kidney disease, stage 3a: Secondary | ICD-10-CM | POA: Diagnosis not present

## 2022-04-28 DIAGNOSIS — I10 Essential (primary) hypertension: Secondary | ICD-10-CM | POA: Diagnosis not present

## 2022-04-28 DIAGNOSIS — K219 Gastro-esophageal reflux disease without esophagitis: Secondary | ICD-10-CM | POA: Diagnosis not present

## 2022-04-28 DIAGNOSIS — E782 Mixed hyperlipidemia: Secondary | ICD-10-CM | POA: Diagnosis not present

## 2022-05-24 DIAGNOSIS — M4155 Other secondary scoliosis, thoracolumbar region: Secondary | ICD-10-CM | POA: Diagnosis not present

## 2022-06-09 ENCOUNTER — Inpatient Hospital Stay: Payer: Medicare Other | Admitting: Nurse Practitioner

## 2022-06-09 ENCOUNTER — Inpatient Hospital Stay: Payer: Medicare Other

## 2022-06-21 DIAGNOSIS — M4185 Other forms of scoliosis, thoracolumbar region: Secondary | ICD-10-CM | POA: Diagnosis not present

## 2022-06-21 DIAGNOSIS — M4722 Other spondylosis with radiculopathy, cervical region: Secondary | ICD-10-CM | POA: Diagnosis not present

## 2022-06-21 DIAGNOSIS — M4316 Spondylolisthesis, lumbar region: Secondary | ICD-10-CM | POA: Diagnosis not present

## 2022-06-21 DIAGNOSIS — M4726 Other spondylosis with radiculopathy, lumbar region: Secondary | ICD-10-CM | POA: Diagnosis not present

## 2022-06-21 DIAGNOSIS — M48061 Spinal stenosis, lumbar region without neurogenic claudication: Secondary | ICD-10-CM | POA: Diagnosis not present

## 2022-06-21 DIAGNOSIS — M5116 Intervertebral disc disorders with radiculopathy, lumbar region: Secondary | ICD-10-CM | POA: Diagnosis not present

## 2022-06-23 ENCOUNTER — Telehealth: Payer: Self-pay | Admitting: Nurse Practitioner

## 2022-06-23 DIAGNOSIS — K5730 Diverticulosis of large intestine without perforation or abscess without bleeding: Secondary | ICD-10-CM | POA: Diagnosis not present

## 2022-06-23 DIAGNOSIS — E782 Mixed hyperlipidemia: Secondary | ICD-10-CM | POA: Diagnosis not present

## 2022-06-23 DIAGNOSIS — I10 Essential (primary) hypertension: Secondary | ICD-10-CM | POA: Diagnosis not present

## 2022-06-23 DIAGNOSIS — Z86010 Personal history of colonic polyps: Secondary | ICD-10-CM | POA: Diagnosis not present

## 2022-06-23 DIAGNOSIS — K219 Gastro-esophageal reflux disease without esophagitis: Secondary | ICD-10-CM | POA: Diagnosis not present

## 2022-06-23 DIAGNOSIS — Z85038 Personal history of other malignant neoplasm of large intestine: Secondary | ICD-10-CM | POA: Diagnosis not present

## 2022-06-23 NOTE — Telephone Encounter (Signed)
R/s  Per 10/25 in basket, message left with pt

## 2022-06-28 NOTE — Progress Notes (Unsigned)
[place]   Telephone:(336) [phone] Fax:(336) [phone]   Clinic Follow up Note   Patient Care Team: Merrilee Seashore, MD as PCP - General (Internal Medicine) Merrilee Seashore, MD (Internal Medicine) 06/29/2022  CHIEF COMPLAINT: Follow-up stage I colon cancer  SUMMARY OF ONCOLOGIC HISTORY: Oncology History  Primary adenocarcinoma of ascending colon ([place])  05/19/2021 Procedure   GI work-up by Dr. Collene Mares  EGD impression - Normal appearing, widely patent esophagus and GEJ. - Normal appearing stomach. - Normal examined duodenum. - No specimens collected.  Colonoscopy impression  -an ulcerated, partially obstructing, circunferential mass was found in the proximal ascending colon-this was biopsied; I was unable to pass the scope through the mas sinto the cecum. No additional abnormalities were found on retroflexion.   05/19/2021 Initial Biopsy   FINAL MICROSCOPIC DIAGNOSIS:   A. COLON, PROXIMAL RIGHT, ASCENDING, BIOPSY:  - Adenocarcinoma.   MMR - abnormal IHC EXPRESSION RESULTS  TEST           RESULT  MLH1:          LOSS OF NUCLEAR EXPRESSION  MSH2:          Preserved nuclear expression  MSH6:          Preserved nuclear expression  PMS2:          LOSS OF NUCLEAR EXPRESSION   Microsatellite instability high  MLH1 hypermethylation present   05/19/2021 Imaging   Staging CT CAP IMPRESSION: 1. 2.5 cm apple-core type ascending colon lesion consistent with known colon cancer. No findings for local tumor invasion of the pericolonic fat or adjacent ileocolic adenopathy. 2. No findings for metastatic disease involving the chest, abdomen or pelvis. 3. Advanced atherosclerotic calcifications involving the thoracic and abdominal aorta and branch vessels including the coronary arteries. 4. Severe degenerative changes involving both shoulders with joint effusions and large bursal fluid collections. 5. Stable complex left thyroid lobe mass, previously addressed. 6.  Aortic atherosclerosis.   05/21/2021 Cancer Staging   Staging form: Colon and Rectum, AJCC 8th Edition - Pathologic stage from 05/21/2021: Stage I (pT1, pN0, cM0) - Signed by Cindy Feeling, NP on 06/07/2021 Stage prefix: Initial diagnosis Total positive nodes: 0 Histologic grading system: 4 grade system Histologic grade (G): G2 Laterality: Right Carcinoembryonic antigen (CEA) (ng/mL): 2.6 Microsatellite instability (MSI): Unstable high   05/21/2021 Definitive Surgery   FINAL MICROSCOPIC DIAGNOSIS:   A. COLON, RIGHT WITH TERMIAL ILEUM, RESECTION:  - Invasive moderately differentiated adenocarcinoma, 2.5 cm, involving  ascending colon  - Carcinoma invades into submucosa, see comment  - Resection margins are negative for carcinoma  - Nineteen lymph nodes, negative for carcinoma (0/19)  Pathologic Stage Classification (pTNM, AJCC 8th Edition): pT1, pN0    06/07/2021 Initial Diagnosis   Primary adenocarcinoma of ascending colon ([place])     CURRENT THERAPY: Surveillance  INTERVAL HISTORY: Cindy Patterson returns for follow-up as scheduled, last seen by me 06/21/2021.  She tried to schedule a 4-monthfollow-up but could not get through and had to leave a message.  She has been doing well independently in good health, does pool aerobics 3 times a week.  Energy and appetite are normal, denies unintentional weight loss.  Denies change in bowel habits, rectal bleeding, or abdominal pain/bloating.  She is scheduled to undergo a surveillance colonoscopy with Dr. MCollene Mares11/13/2023.  All other systems were reviewed with the patient and are negative.  MEDICAL HISTORY:  Past Medical History:  Diagnosis Date   Acid reflux    Back pain  Coronary artery disease    Diverticulosis    Essential hypertension    GERD (gastroesophageal reflux disease)    Hyperlipidemia    Hypertension    Osteoarthritis    Umbilical hernia     SURGICAL HISTORY: Past Surgical History:  Procedure Laterality Date    ABDOMINAL HYSTERECTOMY     total vaginal   ABDOMINOPLASTY     APPENDECTOMY     BIOPSY  05/19/2021   Procedure: BIOPSY;  Surgeon: [name], MD;  [facility];  Service: Endoscopy;;   COLONOSCOPY WITH PROPOFOL N/A 05/19/2021   Procedure: COLONOSCOPY WITH PROPOFOL;  Surgeon: [name], MD;  [facility];  Service: Endoscopy;  Laterality: N/A;   DECOMPRESSIVE LUMBAR LAMINECTOMY LEVEL 1     DECOMPRESSIVE LUMBAR LAMINECTOMY LEVEL 4     ESOPHAGOGASTRODUODENOSCOPY (EGD) WITH PROPOFOL N/A 05/19/2021   Procedure: ESOPHAGOGASTRODUODENOSCOPY (EGD) WITH PROPOFOL;  Surgeon: [name], MD;  [facility];  Service: Endoscopy;  Laterality: N/A;   HERNIA REPAIR     umb hernia repair   JOINT REPLACEMENT     LAPAROSCOPIC RIGHT HEMI COLECTOMY N/A 05/21/2021   Procedure: LAPAROSCOPIC ASSISTED RIGHT HEMI COLECTOMY;  Surgeon: Dwan Bolt, MD;  Location: [facility];  Service: General;  Laterality: N/A;   REPLACEMENT TOTAL KNEE BILATERAL     RHINOPLASTY     TONSILLECTOMY     TONSILLECTOMY AND ADENOIDECTOMY      I have reviewed the social history and family history with the patient and they are unchanged from previous note.  ALLERGIES:  is allergic to codeine and sulfa antibiotics.  MEDICATIONS:  Current Outpatient Medications  Medication Sig Dispense Refill   acetaminophen (TYLENOL) 500 MG tablet Take 1,000 mg by mouth every 6 (six) hours as needed for mild pain or moderate pain.     amLODipine (NORVASC) 5 MG tablet Take 1 tablet (5 mg total) by mouth daily. 30 tablet 0   Ascorbic Acid (VITAMIN C) 1000 MG tablet Take 1,000 mg by mouth daily.     atorvastatin (LIPITOR) 10 MG tablet Take 10 mg by mouth daily.     Cholecalciferol 125 MCG (5000 UT) capsule Take 5,000 Units by mouth daily.     ferrous sulfate 325 (65 FE) MG tablet Take 1 tablet (325 mg total) by mouth daily with breakfast. 30 tablet 0   furosemide (LASIX) 20 MG tablet Take 1 tablet (20 mg total) by mouth  daily as needed for edema. Take with banana or OJ 30 tablet 0   lisinopril (ZESTRIL) 40 MG tablet Take 1 tablet (40 mg total) by mouth daily. 30 tablet 0   pantoprazole (PROTONIX) 40 MG tablet Take 40 mg by mouth daily.     pyridOXINE (VITAMIN B-6) 100 MG tablet Take 100 mg by mouth daily.     tiZANidine (ZANAFLEX) 2 MG tablet Take 2 mg by mouth 3 (three) times daily as needed for muscle spasms.     traMADol (ULTRAM) 50 MG tablet Take 1 tablet (50 mg total) by mouth every 6 (six) hours as needed. (Patient taking differently: Take 50 mg by mouth every 6 (six) hours as needed for moderate pain.) 20 tablet 0   vitamin C (ASCORBIC ACID) 500 MG tablet Take 1,000 mg by mouth daily.     zinc gluconate 50 MG tablet Take 50 mg by mouth daily.     No current facility-administered medications for this visit.    PHYSICAL EXAMINATION: ECOG PERFORMANCE STATUS: 0 - Asymptomatic  Vitals:   06/29/22  1052  BP: (!) 201/79  Pulse: 72  Resp: (!) 72  Temp: (!) 97.5 F (36.4 C)  SpO2: 100%   Filed Weights   06/29/22 1052  Weight: 147 lb 4.8 oz (66.8 kg)    GENERAL:alert, no distress and comfortable SKIN: No rash EYES: sclera clear NECK: without mass LUNGS: clear with normal breathing effort HEART: regular rate & rhythm, no lower extremity edema ABDOMEN:abdomen soft, non-tender and normal bowel sounds NEURO: alert & oriented x 3 with fluent speech, no focal motor/sensory deficits  LABORATORY DATA:  I have reviewed the data as listed    Latest Ref Rng & Units 06/29/2022   10:17 AM 06/08/2021    6:04 AM 06/07/2021    1:35 PM  CBC  WBC 4.0 - 10.5 K/uL 3.9  5.9  10.3   Hemoglobin 12.0 - 15.0 g/dL 11.7  7.8  7.9   Hematocrit 36.0 - 46.0 % 34.9  24.7  25.5   Platelets 150 - 400 K/uL 266  326  326         Latest Ref Rng & Units 06/29/2022   10:17 AM 06/08/2021    6:04 AM 06/07/2021    1:35 PM  CMP  Glucose 70 - 99 mg/dL 83  92  101   BUN 8 - 23 mg/dL _0 Creatinine 0.44 - 1.00  mg/dL 1.00  0.79  1.04   Sodium 135 - 145 mmol/L 133  136  135   Potassium 3.5 - 5.1 mmol/L 4.4  3.5  3.9   Chloride 98 - 111 mmol/L 103  108  107   CO2 22 - 32 mmol/L _1 Calcium 8.9 - 10.3 mg/dL 10.1  9.0  8.8   Total Protein 6.5 - 8.1 g/dL 7.1  5.9  6.4   Total Bilirubin 0.3 - 1.2 mg/dL 0.7  0.8  0.8   Alkaline Phos 38 - 126 U/L 63  53  53   AST 15 - 41 U/L _2 ALT 0 - 44 U/L 14  8  38       RADIOGRAPHIC STUDIES: I have personally reviewed the radiological images as listed and agreed with the findings in the report. No results found.   ASSESSMENT & PLAN: 87 yo female    Adenocarcinoma of ascending colon, G2, pT1N0M0 stage I, MMR abnormal, MSI-High -She presented with change in bowel habits and hematochezia, work up showed ascending colon mass. Biopsy confirmed adenocarcinoma. Baseline CEA was normal, staging CT CAP was negative for distant metastasis.  -/p R hemicolectomy by Dr. Zenia Resides on 05/21/21, final path showed 2.6 cm pT1 adenocarcinoma, lymph nodes and margins were negative. Adjuvant chemotherapy was not indicated  -Molecular testing showed loss of expression in MLH1 and PMS2, MLH1 hypermethylation was present, suggesting somatic mutation. This is MSI-H tumor which has a better prognosis  -Ms. Berhow is clinically doing well on surveillance. Exam is benign. Labs reviewed. Will f/up the pending CMP, iron/ferritin, and CEA from today. Overall no clinical concern for recurrence -She is scheduled for surveillance colonoscopy with Dr. Collene Mares on 11/13, will request the report -She is being referred for surveillance CT, will call her with results.  -I will see her back in 1 year, or sooner if needed. We reviewed s/sx of recurrence including unintentional weight loss, change in bowel habits, blood in stool, or abdominal pain/bloating.    IDA -secondary to #1 -She took oral iron  and received IV iron at PCP's office  -Hgb 11.7 today, iron panel is pending   Syncopal  event -she presented 06/07/21 for initial new patient consult, but was found syncopal and transported to ED. She was admitted for observation.  -work up including Echo, EKG, carotid doppler, head CT, and chest xray were unremarkable -she was given IVF and BP meds were adjusted -no recurrent episodes   Co-morbidities - HTN, HL, CAD, GERD -per PCP   Health maintenance  -she is up to date on mammogram -Has resumed aqua aerobics 3 x per week -I encouraged her to continue healthy active lifestyle, avoid smoking, limit alcohol, and stay up to date on vaccines and other age-appropriate health maintenance -she remains independent and in good health  PLAN: -labs reviewed, f/up pending levels -Surveillance colonoscopy with Dr. Collene Mares 11/13 as scheduled -She is being referred for surveillance CT A/P in the next couple weeks, I will call her with results -Follow-up in 1 year, or sooner if needed   Orders Placed This Encounter  Procedures   CT CHEST ABDOMEN PELVIS W CONTRAST    Standing Status:   Future    Standing Expiration Date:   06/30/2023    Order Specific Question:   Preferred imaging location?    Answer:   [facility]    Order Specific Question:   Is Oral Contrast requested for this exam?    Answer:   Yes, Per Radiology protocol   All questions were answered. The patient knows to call the clinic with any problems, questions or concerns. No barriers to learning were detected.      Cindy Feeling, NP 06/29/22

## 2022-06-29 ENCOUNTER — Ambulatory Visit: Payer: Medicare Other | Admitting: Nurse Practitioner

## 2022-06-29 ENCOUNTER — Encounter: Payer: Self-pay | Admitting: Nurse Practitioner

## 2022-06-29 ENCOUNTER — Other Ambulatory Visit: Payer: Medicare Other

## 2022-06-29 ENCOUNTER — Inpatient Hospital Stay (HOSPITAL_BASED_OUTPATIENT_CLINIC_OR_DEPARTMENT_OTHER): Payer: Medicare Other | Admitting: Nurse Practitioner

## 2022-06-29 ENCOUNTER — Inpatient Hospital Stay: Payer: Medicare Other | Attending: Nurse Practitioner

## 2022-06-29 ENCOUNTER — Telehealth: Payer: Self-pay

## 2022-06-29 ENCOUNTER — Other Ambulatory Visit: Payer: Self-pay

## 2022-06-29 VITALS — BP 148/81 | HR 73 | Temp 97.5°F | Resp 72 | Wt 147.3 lb

## 2022-06-29 DIAGNOSIS — I10 Essential (primary) hypertension: Secondary | ICD-10-CM | POA: Diagnosis not present

## 2022-06-29 DIAGNOSIS — Z79899 Other long term (current) drug therapy: Secondary | ICD-10-CM | POA: Insufficient documentation

## 2022-06-29 DIAGNOSIS — Z85038 Personal history of other malignant neoplasm of large intestine: Secondary | ICD-10-CM | POA: Insufficient documentation

## 2022-06-29 DIAGNOSIS — K219 Gastro-esophageal reflux disease without esophagitis: Secondary | ICD-10-CM | POA: Diagnosis not present

## 2022-06-29 DIAGNOSIS — C182 Malignant neoplasm of ascending colon: Secondary | ICD-10-CM

## 2022-06-29 DIAGNOSIS — E785 Hyperlipidemia, unspecified: Secondary | ICD-10-CM | POA: Diagnosis not present

## 2022-06-29 DIAGNOSIS — I2510 Atherosclerotic heart disease of native coronary artery without angina pectoris: Secondary | ICD-10-CM | POA: Insufficient documentation

## 2022-06-29 DIAGNOSIS — Z9049 Acquired absence of other specified parts of digestive tract: Secondary | ICD-10-CM | POA: Diagnosis not present

## 2022-06-29 DIAGNOSIS — D509 Iron deficiency anemia, unspecified: Secondary | ICD-10-CM | POA: Insufficient documentation

## 2022-06-29 DIAGNOSIS — D500 Iron deficiency anemia secondary to blood loss (chronic): Secondary | ICD-10-CM

## 2022-06-29 LAB — CBC WITH DIFFERENTIAL (CANCER CENTER ONLY)
Abs Immature Granulocytes: 0.01 10*3/uL (ref 0.00–0.07)
Basophils Absolute: 0 10*3/uL (ref 0.0–0.1)
Basophils Relative: 0 %
Eosinophils Absolute: 0.1 10*3/uL (ref 0.0–0.5)
Eosinophils Relative: 3 %
HCT: 34.9 % — ABNORMAL LOW (ref 36.0–46.0)
Hemoglobin: 11.7 g/dL — ABNORMAL LOW (ref 12.0–15.0)
Immature Granulocytes: 0 %
Lymphocytes Relative: 29 %
Lymphs Abs: 1.1 10*3/uL (ref 0.7–4.0)
MCH: 31.6 pg (ref 26.0–34.0)
MCHC: 33.5 g/dL (ref 30.0–36.0)
MCV: 94.3 fL (ref 80.0–100.0)
Monocytes Absolute: 0.4 10*3/uL (ref 0.1–1.0)
Monocytes Relative: 9 %
Neutro Abs: 2.3 10*3/uL (ref 1.7–7.7)
Neutrophils Relative %: 59 %
Platelet Count: 266 10*3/uL (ref 150–400)
RBC: 3.7 MIL/uL — ABNORMAL LOW (ref 3.87–5.11)
RDW: 13.6 % (ref 11.5–15.5)
WBC Count: 3.9 10*3/uL — ABNORMAL LOW (ref 4.0–10.5)
nRBC: 0 % (ref 0.0–0.2)

## 2022-06-29 LAB — CMP (CANCER CENTER ONLY)
ALT: 14 U/L (ref 0–44)
AST: 19 U/L (ref 15–41)
Albumin: 4.5 g/dL (ref 3.5–5.0)
Alkaline Phosphatase: 63 U/L (ref 38–126)
Anion gap: 8 (ref 5–15)
BUN: 27 mg/dL — ABNORMAL HIGH (ref 8–23)
CO2: 22 mmol/L (ref 22–32)
Calcium: 10.1 mg/dL (ref 8.9–10.3)
Chloride: 103 mmol/L (ref 98–111)
Creatinine: 1 mg/dL (ref 0.44–1.00)
GFR, Estimated: 55 mL/min — ABNORMAL LOW (ref >60)
Glucose, Bld: 83 mg/dL (ref 70–99)
Potassium: 4.4 mmol/L (ref 3.5–5.1)
Sodium: 133 mmol/L — ABNORMAL LOW (ref 135–145)
Total Bilirubin: 0.7 mg/dL (ref 0.3–1.2)
Total Protein: 7.1 g/dL (ref 6.5–8.1)

## 2022-06-29 LAB — IRON AND IRON BINDING CAPACITY (CC-WL,HP ONLY)
Iron: 102 ug/dL (ref 28–170)
Saturation Ratios: 31 % (ref 10.4–31.8)
TIBC: 326 ug/dL (ref 250–450)
UIBC: 224 ug/dL (ref 148–442)

## 2022-06-29 LAB — CEA (IN HOUSE-CHCC): CEA (CHCC-In House): 3.27 ng/mL (ref 0.00–5.00)

## 2022-06-29 LAB — FERRITIN: Ferritin: 84 ng/mL (ref 11–307)

## 2022-06-29 NOTE — Addendum Note (Signed)
Addended by: Alla Feeling on: 06/29/2022 11:24 AM   Modules accepted: Orders

## 2022-06-29 NOTE — Telephone Encounter (Signed)
Opened in [place]

## 2022-07-05 DIAGNOSIS — N1831 Chronic kidney disease, stage 3a: Secondary | ICD-10-CM | POA: Diagnosis not present

## 2022-07-05 DIAGNOSIS — I700 Atherosclerosis of aorta: Secondary | ICD-10-CM | POA: Diagnosis not present

## 2022-07-05 DIAGNOSIS — E782 Mixed hyperlipidemia: Secondary | ICD-10-CM | POA: Diagnosis not present

## 2022-07-05 DIAGNOSIS — I10 Essential (primary) hypertension: Secondary | ICD-10-CM | POA: Diagnosis not present

## 2022-07-07 DIAGNOSIS — M5412 Radiculopathy, cervical region: Secondary | ICD-10-CM | POA: Diagnosis not present

## 2022-07-07 DIAGNOSIS — G603 Idiopathic progressive neuropathy: Secondary | ICD-10-CM | POA: Diagnosis not present

## 2022-07-07 DIAGNOSIS — M5417 Radiculopathy, lumbosacral region: Secondary | ICD-10-CM | POA: Diagnosis not present

## 2022-07-11 DIAGNOSIS — K5730 Diverticulosis of large intestine without perforation or abscess without bleeding: Secondary | ICD-10-CM | POA: Diagnosis not present

## 2022-07-11 DIAGNOSIS — Z85038 Personal history of other malignant neoplasm of large intestine: Secondary | ICD-10-CM | POA: Diagnosis not present

## 2022-07-11 DIAGNOSIS — Z1211 Encounter for screening for malignant neoplasm of colon: Secondary | ICD-10-CM | POA: Diagnosis not present

## 2022-07-12 DIAGNOSIS — Z23 Encounter for immunization: Secondary | ICD-10-CM | POA: Diagnosis not present

## 2022-07-12 DIAGNOSIS — N1831 Chronic kidney disease, stage 3a: Secondary | ICD-10-CM | POA: Diagnosis not present

## 2022-07-12 DIAGNOSIS — E782 Mixed hyperlipidemia: Secondary | ICD-10-CM | POA: Diagnosis not present

## 2022-07-12 DIAGNOSIS — I700 Atherosclerosis of aorta: Secondary | ICD-10-CM | POA: Diagnosis not present

## 2022-07-12 DIAGNOSIS — D500 Iron deficiency anemia secondary to blood loss (chronic): Secondary | ICD-10-CM | POA: Diagnosis not present

## 2022-07-12 DIAGNOSIS — I10 Essential (primary) hypertension: Secondary | ICD-10-CM | POA: Diagnosis not present

## 2022-07-26 ENCOUNTER — Ambulatory Visit (HOSPITAL_COMMUNITY)
Admission: RE | Admit: 2022-07-26 | Discharge: 2022-07-26 | Disposition: A | Discharge status: Home / Self Care | Payer: Medicare Other | Source: Ambulatory Visit | Attending: Nurse Practitioner | Admitting: Nurse Practitioner

## 2022-07-26 DIAGNOSIS — C189 Malignant neoplasm of colon, unspecified: Secondary | ICD-10-CM | POA: Diagnosis not present

## 2022-07-26 DIAGNOSIS — C182 Malignant neoplasm of ascending colon: Secondary | ICD-10-CM | POA: Diagnosis not present

## 2022-07-26 DIAGNOSIS — S2232XA Fracture of one rib, left side, initial encounter for closed fracture: Secondary | ICD-10-CM | POA: Diagnosis not present

## 2022-07-26 DIAGNOSIS — I700 Atherosclerosis of aorta: Secondary | ICD-10-CM | POA: Diagnosis not present

## 2022-07-26 DIAGNOSIS — I2510 Atherosclerotic heart disease of native coronary artery without angina pectoris: Secondary | ICD-10-CM | POA: Diagnosis not present

## 2022-07-26 DIAGNOSIS — K5730 Diverticulosis of large intestine without perforation or abscess without bleeding: Secondary | ICD-10-CM | POA: Diagnosis not present

## 2022-07-26 MED ORDER — SODIUM CHLORIDE (PF) 0.9 % IJ SOLN
INTRAMUSCULAR | Status: AC
  Filled 2022-07-26: qty 50

## 2022-07-26 MED ORDER — IOHEXOL 9 MG/ML PO SOLN
ORAL | Status: AC
  Filled 2022-07-26: qty 1000

## 2022-07-26 MED ORDER — IOHEXOL 300 MG/ML  SOLN
100.0000 mL | Freq: Once | INTRAMUSCULAR | Status: AC | PRN
  Administered 2022-07-26: 100 mL via INTRAVENOUS

## 2022-07-26 MED ORDER — IOHEXOL 9 MG/ML PO SOLN
1000.0000 mL | ORAL | Status: AC
  Administered 2022-07-26: 1000 mL via ORAL

## 2022-07-27 ENCOUNTER — Other Ambulatory Visit: Payer: Self-pay | Admitting: Nurse Practitioner

## 2022-07-27 DIAGNOSIS — Z1231 Encounter for screening mammogram for malignant neoplasm of breast: Secondary | ICD-10-CM

## 2022-07-28 ENCOUNTER — Encounter: Payer: Self-pay | Admitting: Nurse Practitioner

## 2022-07-28 ENCOUNTER — Inpatient Hospital Stay (HOSPITAL_BASED_OUTPATIENT_CLINIC_OR_DEPARTMENT_OTHER): Payer: Medicare Other | Admitting: Nurse Practitioner

## 2022-07-28 ENCOUNTER — Telehealth: Payer: Self-pay

## 2022-07-28 DIAGNOSIS — N1831 Chronic kidney disease, stage 3a: Secondary | ICD-10-CM | POA: Diagnosis not present

## 2022-07-28 DIAGNOSIS — K219 Gastro-esophageal reflux disease without esophagitis: Secondary | ICD-10-CM | POA: Diagnosis not present

## 2022-07-28 DIAGNOSIS — C182 Malignant neoplasm of ascending colon: Secondary | ICD-10-CM

## 2022-07-28 DIAGNOSIS — D500 Iron deficiency anemia secondary to blood loss (chronic): Secondary | ICD-10-CM | POA: Diagnosis not present

## 2022-07-28 DIAGNOSIS — E782 Mixed hyperlipidemia: Secondary | ICD-10-CM | POA: Diagnosis not present

## 2022-07-28 DIAGNOSIS — I10 Essential (primary) hypertension: Secondary | ICD-10-CM | POA: Diagnosis not present

## 2022-07-28 NOTE — Progress Notes (Signed)
[place]   Telephone:([phone]) [phone] Fax:(336) [phone]   Clinic Follow up Note   Patient Care Team: Merrilee Seashore, MD as PCP - General (Internal Medicine) Merrilee Seashore, MD (Internal Medicine) 07/28/2022  I connected with Cindy Patterson on 07/28/22 at  9:30 AM EST by video enabled telemedicine visit and verified that I am speaking with the correct person using two identifiers.   I discussed the limitations, risks, security and privacy concerns of performing an evaluation and management service by telemedicine and the availability of in-person appointments. I also discussed with the patient that there may be a patient responsible charge related to this service. The patient expressed understanding and agreed to proceed.   Other persons participating in the visit and their role in the encounter: None   Patient's location: Home Provider's location: [place] office  CHIEF COMPLAINT: Review CT results   SUMMARY OF ONCOLOGIC HISTORY: Oncology History  Primary adenocarcinoma of ascending colon ([place])  05/19/2021 Procedure   GI work-up by Dr. Collene Mares  EGD impression - Normal appearing, widely patent esophagus and GEJ. - Normal appearing stomach. - Normal examined duodenum. - No specimens collected.  Colonoscopy impression  -an ulcerated, partially obstructing, circunferential mass was found in the proximal ascending colon-this was biopsied; I was unable to pass the scope through the mas sinto the cecum. No additional abnormalities were found on retroflexion.   05/19/2021 Initial Biopsy   FINAL MICROSCOPIC DIAGNOSIS:   A. COLON, PROXIMAL RIGHT, ASCENDING, BIOPSY:  - Adenocarcinoma.   MMR - abnormal IHC EXPRESSION RESULTS  TEST           RESULT  MLH1:          LOSS OF NUCLEAR EXPRESSION  MSH2:          Preserved nuclear expression  MSH6:          Preserved nuclear expression  PMS2:          LOSS OF NUCLEAR EXPRESSION   Microsatellite instability high  MLH1  hypermethylation present   05/19/2021 Imaging   Staging CT CAP IMPRESSION: 1. 2.5 cm apple-core type ascending colon lesion consistent with known colon cancer. No findings for local tumor invasion of the pericolonic fat or adjacent ileocolic adenopathy. 2. No findings for metastatic disease involving the chest, abdomen or pelvis. 3. Advanced atherosclerotic calcifications involving the thoracic and abdominal aorta and branch vessels including the coronary arteries. 4. Severe degenerative changes involving both shoulders with joint effusions and large bursal fluid collections. 5. Stable complex left thyroid lobe mass, previously addressed. 6. Aortic atherosclerosis.   05/21/2021 Cancer Staging   Staging form: Colon and Rectum, AJCC 8th Edition - Pathologic stage from 05/21/2021: Stage I (pT1, pN0, cM0) - Signed by Alla Feeling, NP on 06/07/2021 Stage prefix: Initial diagnosis Total positive nodes: 0 Histologic grading system: 4 grade system Histologic grade (G): G2 Laterality: Right Carcinoembryonic antigen (CEA) (ng/mL): 2.6 Microsatellite instability (MSI): Unstable high   05/21/2021 Definitive Surgery   FINAL MICROSCOPIC DIAGNOSIS:   A. COLON, RIGHT WITH TERMIAL ILEUM, RESECTION:  - Invasive moderately differentiated adenocarcinoma, 2.5 cm, involving  ascending colon  - Carcinoma invades into submucosa, see comment  - Resection margins are negative for carcinoma  - Nineteen lymph nodes, negative for carcinoma (0/19)  Pathologic Stage Classification (pTNM, AJCC 8th Edition): pT1, pN0    06/07/2021 Initial Diagnosis   Primary adenocarcinoma of ascending colon ([facility])     CURRENT THERAPY: Surveillance  INTERVAL HISTORY: [name] presents for video visit  to review surveillance CT. She is doing well without any changes.  Energy and appetite/weight are normal, bowels moving well, no pain or bloody stools.  She had a colonoscopy 11/13 by Dr. Collene Mares which was "fine."  All other  systems were reviewed with the patient and are negative.  MEDICAL HISTORY:  Past Medical History:  Diagnosis Date   Acid reflux    Back pain    Coronary artery disease    Diverticulosis    Essential hypertension    GERD (gastroesophageal reflux disease)    Hyperlipidemia    Hypertension    Osteoarthritis    Umbilical hernia     SURGICAL HISTORY: Past Surgical History:  Procedure Laterality Date   ABDOMINAL HYSTERECTOMY     total vaginal   ABDOMINOPLASTY     APPENDECTOMY     BIOPSY  05/19/2021   Procedure: BIOPSY;  Surgeon: [name], MD;  Location: [facility];  Service: Endoscopy;;   COLONOSCOPY WITH PROPOFOL N/A 05/19/2021   Procedure: COLONOSCOPY WITH PROPOFOL;  Surgeon: [name], MD;  Location: [facility];  Service: Endoscopy;  Laterality: N/A;   DECOMPRESSIVE LUMBAR LAMINECTOMY LEVEL 1     DECOMPRESSIVE LUMBAR LAMINECTOMY LEVEL 4     ESOPHAGOGASTRODUODENOSCOPY (EGD) WITH PROPOFOL N/A 05/19/2021   Procedure: ESOPHAGOGASTRODUODENOSCOPY (EGD) WITH PROPOFOL;  Surgeon: [name], MD;  Location: [facility];  Service: Endoscopy;  Laterality: N/A;   HERNIA REPAIR     umb hernia repair   JOINT REPLACEMENT     LAPAROSCOPIC RIGHT HEMI COLECTOMY N/A 05/21/2021   Procedure: LAPAROSCOPIC ASSISTED RIGHT HEMI COLECTOMY;  Surgeon: Dwan Bolt, MD;  Location: [facility];  Service: General;  Laterality: N/A;   REPLACEMENT TOTAL KNEE BILATERAL     RHINOPLASTY     TONSILLECTOMY     TONSILLECTOMY AND ADENOIDECTOMY      I have reviewed the social history and family history with the patient and they are unchanged from previous note.  ALLERGIES:  is allergic to codeine and sulfa antibiotics.  MEDICATIONS:  Current Outpatient Medications  Medication Sig Dispense Refill   acetaminophen (TYLENOL) 500 MG tablet Take 1,000 mg by mouth every 6 (six) hours as needed for mild pain or moderate pain.     amLODipine (NORVASC) 5 MG tablet Take 1 tablet (5 mg total) by mouth daily. 30  tablet 0   Ascorbic Acid (VITAMIN C) 1000 MG tablet Take 1,000 mg by mouth daily.     atorvastatin (LIPITOR) 10 MG tablet Take 10 mg by mouth daily.     Cholecalciferol 125 MCG (5000 UT) capsule Take 5,000 Units by mouth daily.     pantoprazole (PROTONIX) 40 MG tablet Take 40 mg by mouth daily.     pyridOXINE (VITAMIN B-6) 100 MG tablet Take 100 mg by mouth daily.     tiZANidine (ZANAFLEX) 2 MG tablet Take 2 mg by mouth 3 (three) times daily as needed for muscle spasms.     traMADol (ULTRAM) 50 MG tablet Take 1 tablet (50 mg total) by mouth every 6 (six) hours as needed. (Patient taking differently: Take 50 mg by mouth every 6 (six) hours as needed for moderate pain.) 20 tablet 0   zinc gluconate 50 MG tablet Take 50 mg by mouth daily.     No current facility-administered medications for this visit.    PHYSICAL EXAMINATION:  There were no vitals filed for this visit. There were no vitals filed for this visit.  Exam limited to observation on video visit.  Patient  appears well in no acute distress.  Skin on face is normal without rash.  No cough or dyspnea, thing nonlabored.  Speech is clear and intact.   LABORATORY DATA:  I have reviewed the data as listed    Latest Ref Rng & Units 06/29/2022   10:17 AM 06/08/2021    6:04 AM 06/07/2021    1:35 PM  CBC  WBC 4.0 - 10.5 K/uL 3.9  5.9  10.3   Hemoglobin 12.0 - 15.0 g/dL 11.7  7.8  7.9   Hematocrit 36.0 - 46.0 % 34.9  24.7  25.5   Platelets 150 - 400 K/uL 266  326  326         Latest Ref Rng & Units 06/29/2022   10:17 AM 06/08/2021    6:04 AM 06/07/2021    1:35 PM  CMP  Glucose 70 - 99 mg/dL 83  92  101   BUN 8 - 23 mg/dL _0 Creatinine 0.44 - 1.00 mg/dL 1.00  0.79  1.04   Sodium 135 - 145 mmol/L 133  136  135   Potassium 3.5 - 5.1 mmol/L 4.4  3.5  3.9   Chloride 98 - 111 mmol/L 103  108  107   CO2 22 - 32 mmol/L _1 Calcium 8.9 - 10.3 mg/dL 10.1  9.0  8.8   Total Protein 6.5 - 8.1 g/dL 7.1  5.9  6.4    Total Bilirubin 0.3 - 1.2 mg/dL 0.7  0.8  0.8   Alkaline Phos 38 - 126 U/L 63  53  53   AST 15 - 41 U/L _2 ALT 0 - 44 U/L 14  8  38       RADIOGRAPHIC STUDIES: I have personally reviewed the radiological images as listed and agreed with the findings in the report. No results found.   ASSESSMENT & PLAN:  87 yo female    Adenocarcinoma of ascending colon, G2, pT1N0M0 stage I, MMR abnormal, MSI-High -She presented with change in bowel habits and hematochezia, work up showed ascending colon mass. Biopsy confirmed adenocarcinoma. Baseline CEA was normal, staging CT CAP was negative for distant metastasis.  -S/p R hemicolectomy by Dr. Zenia Resides on 05/21/21, final path showed 2.6 cm pT1 adenocarcinoma, lymph nodes and margins were negative. Adjuvant chemotherapy was not indicated  -Molecular testing showed loss of expression in MLH1 and PMS2, MLH1 hypermethylation was present, suggesting somatic mutation. This is MSI-H tumor which has a better prognosis  -Per pt surveillance colonoscopy 07/11/22 by Dr. Collene Mares was negative, 3 year recall. Will request the report -Ms. Ferg appears to be doing well.  I reviewed her surveillance scan which shows no evidence of recurrent or metastatic disease. -Continue surveillance with annual follow-up, or sooner if needed.  Reviewed signs and symptoms of recurrence   IDA -secondary to #1 -She took oral iron and received IV iron at PCP's office  -Labs 06/29/2022 showed Hgb 11.7, ferritin 84, with normal iron and TIBC.  No need for additional IV iron at this time   3. Co-morbidities - HTN, HL, CAD, GERD -per PCP   4. Health maintenance  -she is up to date on mammogram -Has resumed aqua aerobics 3 x per week -I encouraged her to continue healthy active lifestyle, avoid smoking, limit alcohol, and stay up to date on vaccines and other age-appropriate health maintenance -she remains independent and in good health  Plan: -CT reviewed, no evidence of  recurrence -Continue colon cancer surveillance -Follow-up 06/2023, or sooner if needed -Will request colonoscopy from Dr. Collene Mares   I discussed the assessment and treatment plan with the patient. The patient was provided an opportunity to ask questions and all were answered. The patient agreed with the plan and demonstrated an understanding of the instructions.   The patient was advised to call back or seek an in-person evaluation if the symptoms worsen or if the condition fails to improve as anticipated. I spent 3 minutes counseling the patient face to face/video. The total time spent in the appointment was 5 minutes and more than 50% was on counseling and review of test results.     Alla Feeling, NP 07/28/22

## 2022-07-28 NOTE — Telephone Encounter (Signed)
Called patient, told her we needed her to go by Dr. Lorie Apley office to sign a release of information so we can get the results of her last colonoscopy. Patient stated she could go by on Monday, I will let Cira Rue NP know of the delay.

## 2022-09-15 ENCOUNTER — Encounter: Payer: Self-pay | Admitting: Hematology

## 2022-09-22 ENCOUNTER — Ambulatory Visit
Admission: RE | Admit: 2022-09-22 | Discharge: 2022-09-22 | Disposition: A | Discharge status: Home / Self Care | Payer: Medicare Other | Source: Ambulatory Visit | Attending: Nurse Practitioner | Admitting: Nurse Practitioner

## 2022-09-22 ENCOUNTER — Encounter: Payer: Self-pay | Admitting: Hematology

## 2022-09-22 DIAGNOSIS — Z1231 Encounter for screening mammogram for malignant neoplasm of breast: Secondary | ICD-10-CM

## 2023-06-15 ENCOUNTER — Other Ambulatory Visit: Payer: Self-pay

## 2023-06-15 ENCOUNTER — Emergency Department (HOSPITAL_COMMUNITY)
Admission: EM | Admit: 2023-06-15 | Discharge: 2023-06-15 | Discharge status: Against Medical Advice | Payer: Medicare Other | Attending: Emergency Medicine | Admitting: Emergency Medicine

## 2023-06-15 ENCOUNTER — Encounter (HOSPITAL_COMMUNITY): Payer: Self-pay

## 2023-06-15 DIAGNOSIS — K625 Hemorrhage of anus and rectum: Secondary | ICD-10-CM | POA: Diagnosis present

## 2023-06-15 DIAGNOSIS — Z5321 Procedure and treatment not carried out due to patient leaving prior to being seen by health care provider: Secondary | ICD-10-CM | POA: Diagnosis not present

## 2023-06-15 LAB — COMPREHENSIVE METABOLIC PANEL
ALT: 18 U/L (ref 0–44)
AST: 22 U/L (ref 15–41)
Albumin: 4.3 g/dL (ref 3.5–5.0)
Alkaline Phosphatase: 67 U/L (ref 38–126)
Anion gap: 13 (ref 5–15)
BUN: 26 mg/dL — ABNORMAL HIGH (ref 8–23)
CO2: 20 mmol/L — ABNORMAL LOW (ref 22–32)
Calcium: 10.2 mg/dL (ref 8.9–10.3)
Chloride: 102 mmol/L (ref 98–111)
Creatinine, Ser: 1.02 mg/dL — ABNORMAL HIGH (ref 0.44–1.00)
GFR, Estimated: 54 mL/min — ABNORMAL LOW (ref >60)
Glucose, Bld: 107 mg/dL — ABNORMAL HIGH (ref 70–99)
Potassium: 4.2 mmol/L (ref 3.5–5.1)
Sodium: 135 mmol/L (ref 135–145)
Total Bilirubin: 1.3 mg/dL — ABNORMAL HIGH (ref 0.3–1.2)
Total Protein: 7.1 g/dL (ref 6.5–8.1)

## 2023-06-15 LAB — CBC
HCT: 36.4 % (ref 36.0–46.0)
Hemoglobin: 12.1 g/dL (ref 12.0–15.0)
MCH: 31 pg (ref 26.0–34.0)
MCHC: 33.2 g/dL (ref 30.0–36.0)
MCV: 93.3 fL (ref 80.0–100.0)
Platelets: 365 10*3/uL (ref 150–400)
RBC: 3.9 MIL/uL (ref 3.87–5.11)
RDW: 14.3 % (ref 11.5–15.5)
WBC: 11.6 10*3/uL — ABNORMAL HIGH (ref 4.0–10.5)
nRBC: 0 % (ref 0.0–0.2)

## 2023-06-15 NOTE — ED Triage Notes (Signed)
Patient sent by PCP. Patient reports bright red stool since last night after she has a "hard" stool. Reports mild abdominal pain, denies nausea, vomiting, and fever.  Patient denies taking blood thinners. VSS NAD.

## 2023-06-19 NOTE — Plan of Care (Signed)
[facility] Tonsillectomy/Adenoidectomy, Postoperative PEDS care plan entered in error.

## 2023-07-03 NOTE — Progress Notes (Unsigned)
Patient Care Team: Georgianne Fick, MD as PCP - General (Internal Medicine) Georgianne Fick, MD (Internal Medicine)   CHIEF COMPLAINT: Follow up right colon cancer  Oncology History  Primary adenocarcinoma of ascending colon ([facility])  05/19/2021 Procedure   GI work-up by Dr. Loreta Ave  EGD impression - Normal appearing, widely patent esophagus and GEJ. - Normal appearing stomach. - Normal examined duodenum. - No specimens collected.  Colonoscopy impression  -an ulcerated, partially obstructing, circunferential mass was found in the proximal ascending colon-this was biopsied; I was unable to pass the scope through the mas sinto the cecum. No additional abnormalities were found on retroflexion.   05/19/2021 Initial Biopsy   FINAL MICROSCOPIC DIAGNOSIS:   A. COLON, PROXIMAL RIGHT, ASCENDING, BIOPSY:  - Adenocarcinoma.   MMR - abnormal IHC EXPRESSION RESULTS  TEST           RESULT  MLH1:          LOSS OF NUCLEAR EXPRESSION  MSH2:          Preserved nuclear expression  MSH6:          Preserved nuclear expression  PMS2:          LOSS OF NUCLEAR EXPRESSION   Microsatellite instability high  MLH1 hypermethylation present   05/19/2021 Imaging   Staging CT CAP IMPRESSION: 1. 2.5 cm apple-core type ascending colon lesion consistent with known colon cancer. No findings for local tumor invasion of the pericolonic fat or adjacent ileocolic adenopathy. 2. No findings for metastatic disease involving the chest, abdomen or pelvis. 3. Advanced atherosclerotic calcifications involving the thoracic and abdominal aorta and branch vessels including the coronary arteries. 4. Severe degenerative changes involving both shoulders with joint effusions and large bursal fluid collections. 5. Stable complex left thyroid lobe mass, previously addressed. 6. Aortic atherosclerosis.   05/21/2021 Cancer Staging   Staging form: Colon and Rectum, AJCC 8th Edition - Pathologic stage from 05/21/2021: Stage  I (pT1, pN0, cM0) - Signed by Pollyann Samples, NP on 06/07/2021 Stage prefix: Initial diagnosis Total positive nodes: 0 Histologic grading system: 4 grade system Histologic grade (G): G2 Laterality: Right Carcinoembryonic antigen (CEA) (ng/mL): 2.6 Microsatellite instability (MSI): Unstable high   05/21/2021 Definitive Surgery   FINAL MICROSCOPIC DIAGNOSIS:   A. COLON, RIGHT WITH TERMIAL ILEUM, RESECTION:  - Invasive moderately differentiated adenocarcinoma, 2.5 cm, involving  ascending colon  - Carcinoma invades into submucosa, see comment  - Resection margins are negative for carcinoma  - Nineteen lymph nodes, negative for carcinoma (0/19)  Pathologic Stage Classification (pTNM, AJCC 8th Edition): pT1, pN0    06/07/2021 Initial Diagnosis   Primary adenocarcinoma of ascending colon ([facility])      CURRENT THERAPY: Surveillance  INTERVAL HISTORY Cindy Patterson returns for follow up as scheduled. Last seen by me 07/28/22.   ROS   Past Medical History:  Diagnosis Date   Acid reflux    Back pain    Coronary artery disease    Diverticulosis    Essential hypertension    GERD (gastroesophageal reflux disease)    Hyperlipidemia    Hypertension    Osteoarthritis    Umbilical hernia      Past Surgical History:  Procedure Laterality Date   ABDOMINAL HYSTERECTOMY     total vaginal   ABDOMINOPLASTY     APPENDECTOMY     BIOPSY  05/19/2021   Procedure: BIOPSY;  Surgeon: Charna Elizabeth, MD;  Location: [facility];  Service: Endoscopy;;   COLONOSCOPY WITH PROPOFOL N/A 05/19/2021  Procedure: COLONOSCOPY WITH PROPOFOL;  Surgeon: Charna Elizabeth, MD;  Location: [facility];  Service: Endoscopy;  Laterality: N/A;   DECOMPRESSIVE LUMBAR LAMINECTOMY LEVEL 1     DECOMPRESSIVE LUMBAR LAMINECTOMY LEVEL 4     ESOPHAGOGASTRODUODENOSCOPY (EGD) WITH PROPOFOL N/A 05/19/2021   Procedure: ESOPHAGOGASTRODUODENOSCOPY (EGD) WITH PROPOFOL;  Surgeon: Charna Elizabeth, MD;  Location: [facility];  Service:  Endoscopy;  Laterality: N/A;   HERNIA REPAIR     umb hernia repair   JOINT REPLACEMENT     LAPAROSCOPIC RIGHT HEMI COLECTOMY N/A 05/21/2021   Procedure: LAPAROSCOPIC ASSISTED RIGHT HEMI COLECTOMY;  Surgeon: Fritzi Mandes, MD;  Location: [facility];  Service: General;  Laterality: N/A;   REPLACEMENT TOTAL KNEE BILATERAL     RHINOPLASTY     TONSILLECTOMY     TONSILLECTOMY AND ADENOIDECTOMY       Outpatient Encounter Medications as of 07/05/2023  Medication Sig   acetaminophen (TYLENOL) 500 MG tablet Take 1,000 mg by mouth every 6 (six) hours as needed for mild pain or moderate pain.   amLODipine (NORVASC) 5 MG tablet Take 1 tablet (5 mg total) by mouth daily.   Ascorbic Acid (VITAMIN C) 1000 MG tablet Take 1,000 mg by mouth daily.   atorvastatin (LIPITOR) 10 MG tablet Take 10 mg by mouth daily.   Cholecalciferol 125 MCG (5000 UT) capsule Take 5,000 Units by mouth daily.   pantoprazole (PROTONIX) 40 MG tablet Take 40 mg by mouth daily.   pyridOXINE (VITAMIN B-6) 100 MG tablet Take 100 mg by mouth daily.   tiZANidine (ZANAFLEX) 2 MG tablet Take 2 mg by mouth 3 (three) times daily as needed for muscle spasms.   traMADol (ULTRAM) 50 MG tablet Take 1 tablet (50 mg total) by mouth every 6 (six) hours as needed. (Patient taking differently: Take 50 mg by mouth every 6 (six) hours as needed for moderate pain.)   zinc gluconate 50 MG tablet Take 50 mg by mouth daily.   No facility-administered encounter medications on file as of 07/05/2023.     There were no vitals filed for this visit. There is no height or weight on file to calculate BMI.   PHYSICAL EXAM GENERAL:alert, no distress and comfortable SKIN: no rash  EYES: sclera clear NECK: without mass LYMPH:  no palpable cervical or supraclavicular lymphadenopathy  LUNGS: clear with normal breathing effort HEART: regular rate & rhythm, no lower extremity edema ABDOMEN: abdomen soft, non-tender and normal bowel sounds NEURO: alert & oriented x  3 with fluent speech, no focal motor/sensory deficits Breast exam:  PAC without erythema    CBC    Component Value Date/Time   WBC 11.6 (H) 06/15/2023 1456   RBC 3.90 06/15/2023 1456   HGB 12.1 06/15/2023 1456   HGB 11.7 (L) 06/29/2022 1017   HCT 36.4 06/15/2023 1456   PLT 365 06/15/2023 1456   PLT 266 06/29/2022 1017   MCV 93.3 06/15/2023 1456   MCH 31.0 06/15/2023 1456   MCHC 33.2 06/15/2023 1456   RDW 14.3 06/15/2023 1456   LYMPHSABS 1.1 06/29/2022 1017   MONOABS 0.4 06/29/2022 1017   EOSABS 0.1 06/29/2022 1017   BASOSABS 0.0 06/29/2022 1017     CMP     Component Value Date/Time   NA 135 06/15/2023 1456   K 4.2 06/15/2023 1456   CL 102 06/15/2023 1456   CO2 20 (L) 06/15/2023 1456   GLUCOSE 107 (H) 06/15/2023 1456   BUN 26 (H) 06/15/2023 1456   CREATININE 1.02 (H) 06/15/2023  1456   CREATININE 1.00 06/29/2022 1017   CALCIUM 10.2 06/15/2023 1456   PROT 7.1 06/15/2023 1456   ALBUMIN 4.3 06/15/2023 1456   AST 22 06/15/2023 1456   AST 19 06/29/2022 1017   ALT 18 06/15/2023 1456   ALT 14 06/29/2022 1017   ALKPHOS 67 06/15/2023 1456   BILITOT 1.3 (H) 06/15/2023 1456   BILITOT 0.7 06/29/2022 1017   GFRNONAA 54 (L) 06/15/2023 1456   GFRNONAA 55 (L) 06/29/2022 1017   GFRAA 73 (L) 02/24/2014 1702     ASSESSMENT & PLAN:87 yo female    Adenocarcinoma of ascending colon, G2, pT1N0M0 stage I, MMR abnormal, MSI-High -She presented with change in bowel habits and hematochezia, work up showed ascending colon mass. Biopsy confirmed adenocarcinoma. Baseline CEA was normal, staging CT CAP was negative for distant metastasis.  -S/p R hemicolectomy by Dr. Freida Busman on 05/21/21, final path showed 2.6 cm pT1 adenocarcinoma, lymph nodes and margins were negative. Adjuvant chemotherapy was not indicated  -Molecular testing showed loss of expression in MLH1 and PMS2, MLH1 hypermethylation was present, suggesting somatic mutation. This is MSI-H tumor which has a better prognosis  -Per pt  surveillance colonoscopy 07/11/22 by Dr. Loreta Ave was negative, 3 year recall.  -06/2022 surveillance scan NED   IDA -secondary to #1 -She took oral iron and received IV iron at PCP's office  -Labs 06/29/2022 showed Hgb 11.7, ferritin 84, with normal iron and TIBC.  No need for additional IV iron at this time   3. Co-morbidities - HTN, HL, CAD, GERD -per PCP   4. Health maintenance  -she is up to date on mammogram -Has resumed aqua aerobics 3 x per week -I encouraged her to continue healthy active lifestyle, avoid smoking, limit alcohol, and stay up to date on vaccines and other age-appropriate health maintenance -she remains independent and in good health     PLAN:  No orders of the defined types were placed in this encounter.     All questions were answered. The patient knows to call the clinic with any problems, questions or concerns. No barriers to learning were detected. I spent *** counseling the patient face to face. The total time spent in the appointment was *** and more than 50% was on counseling, review of test results, and coordination of care.   Santiago Glad, NP-C @DATE @

## 2023-07-05 ENCOUNTER — Other Ambulatory Visit: Payer: Self-pay | Admitting: *Deleted

## 2023-07-05 ENCOUNTER — Encounter: Payer: Self-pay | Admitting: Nurse Practitioner

## 2023-07-05 ENCOUNTER — Inpatient Hospital Stay: Payer: Medicare Other | Attending: Nurse Practitioner

## 2023-07-05 ENCOUNTER — Inpatient Hospital Stay (HOSPITAL_BASED_OUTPATIENT_CLINIC_OR_DEPARTMENT_OTHER): Payer: Medicare Other | Admitting: Nurse Practitioner

## 2023-07-05 ENCOUNTER — Other Ambulatory Visit: Payer: Self-pay | Admitting: Nurse Practitioner

## 2023-07-05 VITALS — BP 146/71 | HR 82 | Temp 98.0°F | Resp 18 | Ht 61.0 in | Wt 153.5 lb

## 2023-07-05 DIAGNOSIS — C182 Malignant neoplasm of ascending colon: Secondary | ICD-10-CM

## 2023-07-05 DIAGNOSIS — D500 Iron deficiency anemia secondary to blood loss (chronic): Secondary | ICD-10-CM

## 2023-07-05 DIAGNOSIS — E785 Hyperlipidemia, unspecified: Secondary | ICD-10-CM | POA: Diagnosis not present

## 2023-07-05 DIAGNOSIS — Z85038 Personal history of other malignant neoplasm of large intestine: Secondary | ICD-10-CM | POA: Insufficient documentation

## 2023-07-05 DIAGNOSIS — K219 Gastro-esophageal reflux disease without esophagitis: Secondary | ICD-10-CM | POA: Insufficient documentation

## 2023-07-05 DIAGNOSIS — D509 Iron deficiency anemia, unspecified: Secondary | ICD-10-CM | POA: Diagnosis not present

## 2023-07-05 DIAGNOSIS — I2510 Atherosclerotic heart disease of native coronary artery without angina pectoris: Secondary | ICD-10-CM | POA: Insufficient documentation

## 2023-07-05 DIAGNOSIS — I10 Essential (primary) hypertension: Secondary | ICD-10-CM | POA: Diagnosis not present

## 2023-07-05 LAB — CMP (CANCER CENTER ONLY)
ALT: 12 U/L (ref 0–44)
AST: 19 U/L (ref 15–41)
Albumin: 4.4 g/dL (ref 3.5–5.0)
Alkaline Phosphatase: 70 U/L (ref 38–126)
Anion gap: 7 (ref 5–15)
BUN: 34 mg/dL — ABNORMAL HIGH (ref 8–23)
CO2: 26 mmol/L (ref 22–32)
Calcium: 10 mg/dL (ref 8.9–10.3)
Chloride: 101 mmol/L (ref 98–111)
Creatinine: 1.16 mg/dL — ABNORMAL HIGH (ref 0.44–1.00)
GFR, Estimated: 46 mL/min — ABNORMAL LOW (ref >60)
Glucose, Bld: 94 mg/dL (ref 70–99)
Potassium: 4.4 mmol/L (ref 3.5–5.1)
Sodium: 134 mmol/L — ABNORMAL LOW (ref 135–145)
Total Bilirubin: 0.7 mg/dL (ref <1.2)
Total Protein: 6.9 g/dL (ref 6.5–8.1)

## 2023-07-05 LAB — IRON AND IRON BINDING CAPACITY (CC-WL,HP ONLY)
Iron: 71 ug/dL (ref 28–170)
Saturation Ratios: 21 % (ref 10.4–31.8)
TIBC: 332 ug/dL (ref 250–450)
UIBC: 261 ug/dL (ref 148–442)

## 2023-07-05 LAB — CBC WITH DIFFERENTIAL (CANCER CENTER ONLY)
Abs Immature Granulocytes: 0.01 10*3/uL (ref 0.00–0.07)
Basophils Absolute: 0 10*3/uL (ref 0.0–0.1)
Basophils Relative: 0 %
Eosinophils Absolute: 0.2 10*3/uL (ref 0.0–0.5)
Eosinophils Relative: 4 %
HCT: 34.3 % — ABNORMAL LOW (ref 36.0–46.0)
Hemoglobin: 11.3 g/dL — ABNORMAL LOW (ref 12.0–15.0)
Immature Granulocytes: 0 %
Lymphocytes Relative: 24 %
Lymphs Abs: 1.2 10*3/uL (ref 0.7–4.0)
MCH: 30.8 pg (ref 26.0–34.0)
MCHC: 32.9 g/dL (ref 30.0–36.0)
MCV: 93.5 fL (ref 80.0–100.0)
Monocytes Absolute: 0.5 10*3/uL (ref 0.1–1.0)
Monocytes Relative: 10 %
Neutro Abs: 3.2 10*3/uL (ref 1.7–7.7)
Neutrophils Relative %: 62 %
Platelet Count: 312 10*3/uL (ref 150–400)
RBC: 3.67 MIL/uL — ABNORMAL LOW (ref 3.87–5.11)
RDW: 14.4 % (ref 11.5–15.5)
WBC Count: 5.2 10*3/uL (ref 4.0–10.5)
nRBC: 0 % (ref 0.0–0.2)

## 2023-07-05 LAB — FERRITIN: Ferritin: 61 ng/mL (ref 11–307)

## 2023-07-05 LAB — CEA (ACCESS): CEA (CHCC): 3.62 ng/mL (ref 0.00–5.00)

## 2023-08-09 ENCOUNTER — Other Ambulatory Visit: Payer: Self-pay | Admitting: Internal Medicine

## 2023-08-09 DIAGNOSIS — Z1231 Encounter for screening mammogram for malignant neoplasm of breast: Secondary | ICD-10-CM

## 2023-08-17 LAB — MOLECULAR PATHOLOGY

## 2023-09-25 ENCOUNTER — Ambulatory Visit
Admission: RE | Admit: 2023-09-25 | Discharge: 2023-09-25 | Disposition: A | Discharge status: Home / Self Care | Payer: Medicare Other | Source: Ambulatory Visit | Attending: Internal Medicine | Admitting: Internal Medicine

## 2023-09-25 DIAGNOSIS — Z1231 Encounter for screening mammogram for malignant neoplasm of breast: Secondary | ICD-10-CM

## 2024-07-03 ENCOUNTER — Other Ambulatory Visit: Payer: Self-pay

## 2024-07-03 DIAGNOSIS — C182 Malignant neoplasm of ascending colon: Secondary | ICD-10-CM

## 2024-07-03 DIAGNOSIS — D500 Iron deficiency anemia secondary to blood loss (chronic): Secondary | ICD-10-CM

## 2024-07-04 ENCOUNTER — Inpatient Hospital Stay: Attending: Hematology

## 2024-07-04 ENCOUNTER — Other Ambulatory Visit: Payer: Medicare Other

## 2024-07-04 ENCOUNTER — Ambulatory Visit: Payer: Medicare Other | Admitting: Hematology

## 2024-07-04 ENCOUNTER — Inpatient Hospital Stay: Admitting: Hematology

## 2024-07-04 VITALS — BP 132/70 | HR 78 | Temp 98.1°F | Resp 18 | Wt 147.6 lb

## 2024-07-04 DIAGNOSIS — C182 Malignant neoplasm of ascending colon: Secondary | ICD-10-CM | POA: Diagnosis not present

## 2024-07-04 DIAGNOSIS — Z79899 Other long term (current) drug therapy: Secondary | ICD-10-CM | POA: Diagnosis not present

## 2024-07-04 DIAGNOSIS — D631 Anemia in chronic kidney disease: Secondary | ICD-10-CM | POA: Insufficient documentation

## 2024-07-04 DIAGNOSIS — G8190 Hemiplegia, unspecified affecting unspecified side: Secondary | ICD-10-CM | POA: Diagnosis not present

## 2024-07-04 DIAGNOSIS — Z85038 Personal history of other malignant neoplasm of large intestine: Secondary | ICD-10-CM | POA: Diagnosis present

## 2024-07-04 DIAGNOSIS — E785 Hyperlipidemia, unspecified: Secondary | ICD-10-CM | POA: Insufficient documentation

## 2024-07-04 DIAGNOSIS — I129 Hypertensive chronic kidney disease with stage 1 through stage 4 chronic kidney disease, or unspecified chronic kidney disease: Secondary | ICD-10-CM | POA: Insufficient documentation

## 2024-07-04 DIAGNOSIS — N189 Chronic kidney disease, unspecified: Secondary | ICD-10-CM | POA: Diagnosis not present

## 2024-07-04 DIAGNOSIS — I2510 Atherosclerotic heart disease of native coronary artery without angina pectoris: Secondary | ICD-10-CM | POA: Insufficient documentation

## 2024-07-04 DIAGNOSIS — D500 Iron deficiency anemia secondary to blood loss (chronic): Secondary | ICD-10-CM

## 2024-07-04 LAB — CMP (CANCER CENTER ONLY)
ALT: 15 U/L (ref 0–44)
AST: 22 U/L (ref 15–41)
Albumin: 4 g/dL (ref 3.5–5.0)
Alkaline Phosphatase: 62 U/L (ref 38–126)
Anion gap: 8 (ref 5–15)
BUN: 27 mg/dL — ABNORMAL HIGH (ref 8–23)
CO2: 27 mmol/L (ref 22–32)
Calcium: 9.8 mg/dL (ref 8.9–10.3)
Chloride: 92 mmol/L — ABNORMAL LOW (ref 98–111)
Creatinine: 1.18 mg/dL — ABNORMAL HIGH (ref 0.44–1.00)
GFR, Estimated: 45 mL/min — ABNORMAL LOW (ref >60)
Glucose, Bld: 96 mg/dL (ref 70–99)
Potassium: 4 mmol/L (ref 3.5–5.1)
Sodium: 127 mmol/L — ABNORMAL LOW (ref 135–145)
Total Bilirubin: 0.5 mg/dL (ref 0.0–1.2)
Total Protein: 6.9 g/dL (ref 6.5–8.1)

## 2024-07-04 LAB — CBC WITH DIFFERENTIAL (CANCER CENTER ONLY)
Abs Immature Granulocytes: 0.11 K/uL — ABNORMAL HIGH (ref 0.00–0.07)
Basophils Absolute: 0 K/uL (ref 0.0–0.1)
Basophils Relative: 1 %
Eosinophils Absolute: 0.2 K/uL (ref 0.0–0.5)
Eosinophils Relative: 4 %
HCT: 34.2 % — ABNORMAL LOW (ref 36.0–46.0)
Hemoglobin: 11.7 g/dL — ABNORMAL LOW (ref 12.0–15.0)
Immature Granulocytes: 2 %
Lymphocytes Relative: 19 %
Lymphs Abs: 1.2 K/uL (ref 0.7–4.0)
MCH: 30.2 pg (ref 26.0–34.0)
MCHC: 34.2 g/dL (ref 30.0–36.0)
MCV: 88.1 fL (ref 80.0–100.0)
Monocytes Absolute: 0.6 K/uL (ref 0.1–1.0)
Monocytes Relative: 10 %
Neutro Abs: 4 K/uL (ref 1.7–7.7)
Neutrophils Relative %: 64 %
Platelet Count: 407 K/uL — ABNORMAL HIGH (ref 150–400)
RBC: 3.88 MIL/uL (ref 3.87–5.11)
RDW: 12.6 % (ref 11.5–15.5)
WBC Count: 6.1 K/uL (ref 4.0–10.5)
nRBC: 0 % (ref 0.0–0.2)

## 2024-07-04 LAB — FERRITIN: Ferritin: 209 ng/mL (ref 11–307)

## 2024-07-04 LAB — IRON AND IRON BINDING CAPACITY (CC-WL,HP ONLY)
Iron: 95 ug/dL (ref 28–170)
Saturation Ratios: 35 % — ABNORMAL HIGH (ref 10.4–31.8)
TIBC: 272 ug/dL (ref 250–450)
UIBC: 177 ug/dL (ref 148–442)

## 2024-07-04 LAB — CEA (ACCESS): CEA (CHCC): 3.16 ng/mL (ref 0.00–5.00)

## 2024-07-04 NOTE — Progress Notes (Signed)
 [facility]   [phone] [phone]   Clinic Follow up Note   Patient Care Team: Verdia Lombard, MD as PCP - General (Internal Medicine) Verdia Lombard, MD (Internal Medicine)  Date of Service:  07/04/2024  CHIEF COMPLAINT: f/u of colon cancer  CURRENT THERAPY:  Cancer surveillance  Oncology History   Primary adenocarcinoma of ascending colon (HCC) G2, pT1N0M0 stage I, MMR abnormal, MSI-High -She presented with change in bowel habits and hematochezia, work up showed ascending colon mass. Biopsy confirmed adenocarcinoma. Baseline CEA was normal, staging CT CAP was negative for distant metastasis.  -S/p R hemicolectomy by Dr. Dasie on 05/21/21, final path showed 2.6 cm pT1 adenocarcinoma, lymph nodes and margins were negative. Adjuvant chemotherapy was not indicated  -Molecular testing showed loss of expression in MLH1 and PMS2, MLH1 hypermethylation was present, suggesting somatic mutation. This is MSI-H tumor which has a better prognosis  -Surveillance colonoscopy 07/11/22 by Dr. Kristie revealed scattered diverticula, internal hemorrhoids, patent anastomosis and healthy appearing mucosa; 3 year recall.  -continue cancer surveillance  Assessment & Plan History of stage I ascending colon cancer Stage I ascending colon cancer diagnosed in 2022, considered very early stage and likely cured. No current symptoms or concerns related to colon cancer. No need for regular scans as long as she remains asymptomatic. - Proceed with scheduled colonoscopy on November 24th, 2025, with Dr. Kristie. - No further oncology follow-up needed after this visit.  Chronic kidney disease Slightly elevated creatinine, likely due to aging. No significant concerns at this time. - Ensure adequate hydration.  Anemia Very mild anemia noted on blood tests, not a concern at this time.  Plan - Is clinically doing well, no concern for cancer recurrence - Lab reviewed -  Lab and follow-up in 1 year for his last visit.   SUMMARY OF ONCOLOGIC HISTORY: Oncology History  Primary adenocarcinoma of ascending colon (HCC)  05/19/2021 Procedure   GI work-up by Dr. Kristie  EGD impression - Normal appearing, widely patent esophagus and GEJ. - Normal appearing stomach. - Normal examined duodenum. - No specimens collected.  Colonoscopy impression  -an ulcerated, partially obstructing, circunferential mass was found in the proximal ascending colon-this was biopsied; I was unable to pass the scope through the mas sinto the cecum. No additional abnormalities were found on retroflexion.   05/19/2021 Initial Biopsy   FINAL MICROSCOPIC DIAGNOSIS:   A. COLON, PROXIMAL RIGHT, ASCENDING, BIOPSY:  - Adenocarcinoma.   MMR - abnormal IHC EXPRESSION RESULTS  TEST           RESULT  MLH1:          LOSS OF NUCLEAR EXPRESSION  MSH2:          Preserved nuclear expression  MSH6:          Preserved nuclear expression  PMS2:          LOSS OF NUCLEAR EXPRESSION   Microsatellite instability high  MLH1 hypermethylation present   05/19/2021 Imaging   Staging CT CAP IMPRESSION: 1. 2.5 cm apple-core type ascending colon lesion consistent with known colon cancer. No findings for local tumor invasion of the pericolonic fat or adjacent ileocolic adenopathy. 2. No findings for metastatic disease involving the chest, abdomen or pelvis. 3. Advanced atherosclerotic calcifications involving the thoracic and abdominal aorta and branch vessels including the coronary arteries. 4. Severe degenerative changes involving both shoulders with joint effusions and large bursal fluid collections. 5. Stable complex left thyroid  lobe mass, previously addressed. 6. Aortic atherosclerosis.  05/21/2021 Cancer Staging   Staging form: Colon and Rectum, AJCC 8th Edition - Pathologic stage from 05/21/2021: Stage I (pT1, pN0, cM0) - Signed by Ann Mayme POUR, NP on 06/07/2021 Stage prefix: Initial  diagnosis Total positive nodes: 0 Histologic grading system: 4 grade system Histologic grade (G): G2 Laterality: Right Carcinoembryonic antigen (CEA) (ng/mL): 2.6 Microsatellite instability (MSI): Unstable high   05/21/2021 Definitive Surgery   FINAL MICROSCOPIC DIAGNOSIS:   A. COLON, RIGHT WITH TERMIAL ILEUM, RESECTION:  - Invasive moderately differentiated adenocarcinoma, 2.5 cm, involving  ascending colon  - Carcinoma invades into submucosa, see comment  - Resection margins are negative for carcinoma  - Nineteen lymph nodes, negative for carcinoma (0/19)  Pathologic Stage Classification (pTNM, AJCC 8th Edition): pT1, pN0    06/07/2021 Initial Diagnosis   Primary adenocarcinoma of ascending colon (HCC)      Discussed the use of AI scribe software for clinical note transcription with the patient, who gave verbal consent to proceed.  History of Present Illness Cindy Patterson is an 89 year old female with a history of colon cancer who presents for follow-up.  She was diagnosed with stage one colon cancer in 2022 and is here for a follow-up visit. No new symptoms have been noted over the past year. She is scheduled for another colonoscopy on July 22, 2024, with Dr. Kristie, who has been performing her annual colonoscopies.  Recent blood tests show mild anemia and chronic kidney disease. She is not currently taking Lasix  as her pharmacy stopped filling the prescription.  No current stomach issues, bloating, or changes in bowel movements are reported.     All other systems were reviewed with the patient and are negative.  MEDICAL HISTORY:  Past Medical History:  Diagnosis Date   Acid reflux    Back pain    Coronary artery disease    Diverticulosis    Essential hypertension    GERD (gastroesophageal reflux disease)    Hyperlipidemia    Hypertension    Osteoarthritis    Umbilical hernia     SURGICAL HISTORY: Past Surgical History:  Procedure Laterality Date    ABDOMINAL HYSTERECTOMY     total vaginal   ABDOMINOPLASTY     APPENDECTOMY     BIOPSY  05/19/2021   Procedure: BIOPSY;  Surgeon: Kristie Lamprey, MD;  Location: [facility];  Service: Endoscopy;;   COLONOSCOPY WITH PROPOFOL  N/A 05/19/2021   Procedure: COLONOSCOPY WITH PROPOFOL ;  Surgeon: Kristie Lamprey, MD;  Location: [facility];  Service: Endoscopy;  Laterality: N/A;   DECOMPRESSIVE LUMBAR LAMINECTOMY LEVEL 1     DECOMPRESSIVE LUMBAR LAMINECTOMY LEVEL 4     ESOPHAGOGASTRODUODENOSCOPY (EGD) WITH PROPOFOL  N/A 05/19/2021   Procedure: ESOPHAGOGASTRODUODENOSCOPY (EGD) WITH PROPOFOL ;  Surgeon: Kristie Lamprey, MD;  Location: [facility];  Service: Endoscopy;  Laterality: N/A;   HERNIA REPAIR     umb hernia repair   JOINT REPLACEMENT     LAPAROSCOPIC RIGHT HEMI COLECTOMY N/A 05/21/2021   Procedure: LAPAROSCOPIC ASSISTED RIGHT HEMI COLECTOMY;  Surgeon: Dasie Leonor CROME, MD;  Location: [facility];  Service: General;  Laterality: N/A;   REPLACEMENT TOTAL KNEE BILATERAL     RHINOPLASTY     TONSILLECTOMY     TONSILLECTOMY AND ADENOIDECTOMY      I have reviewed the social history and family history with the patient and they are unchanged from previous note.  ALLERGIES:  is allergic to codeine and sulfa antibiotics.  MEDICATIONS:  Current Outpatient Medications  Medication Sig Dispense Refill  acetaminophen  (TYLENOL ) 500 MG tablet Take 1,000 mg by mouth every 6 (six) hours as needed for mild pain or moderate pain.     amLODipine  (NORVASC ) 5 MG tablet Take 1 tablet (5 mg total) by mouth daily. 30 tablet 0   amLODipine  (NORVASC ) 5 MG tablet TAKE 1 TABLET BY MOUTH EVERY DAY FOR 30 DAYS     Ascorbic Acid  (VITAMIN C) 1000 MG tablet Take 1,000 mg by mouth daily.     atorvastatin  (LIPITOR) 10 MG tablet Take 10 mg by mouth daily.     Cholecalciferol 125 MCG (5000 UT) capsule Take 5,000 Units by mouth daily.     pantoprazole  (PROTONIX ) 40 MG tablet Take 40 mg by mouth daily.     tiZANidine  (ZANAFLEX ) 2 MG tablet  Take 2 mg by mouth 3 (three) times daily as needed for muscle spasms.     traMADol  (ULTRAM ) 50 MG tablet Take 1 tablet (50 mg total) by mouth every 6 (six) hours as needed. (Patient taking differently: Take 50 mg by mouth every 6 (six) hours as needed for moderate pain (pain score 4-6).) 20 tablet 0   meloxicam (MOBIC) 15 MG tablet Take 15 mg by mouth as needed for pain. (Patient not taking: Reported on 07/04/2024)     pyridOXINE  (VITAMIN B-6) 100 MG tablet Take 100 mg by mouth daily. (Patient not taking: Reported on 07/04/2024)     No current facility-administered medications for this visit.    PHYSICAL EXAMINATION: ECOG PERFORMANCE STATUS: 1 - Symptomatic but completely ambulatory  Vitals:   07/04/24 0956  BP: 132/70  Pulse: 78  Resp: 18  Temp: 98.1 F (36.7 C)  SpO2: 97%   Wt Readings from Last 3 Encounters:  07/04/24 147 lb 9.6 oz (67 kg)  07/05/23 153 lb 8 oz (69.6 kg)  06/15/23 148 lb (67.1 kg)    GENERAL:alert, no distress and comfortable SKIN: skin color, texture, turgor are normal, no rashes or significant lesions EYES: normal, Conjunctiva are pink and non-injected, sclera clear NECK: supple, thyroid  normal size, non-tender, without nodularity LYMPH:  no palpable lymphadenopathy in the cervical, axillary  LUNGS: clear to auscultation and percussion with normal breathing effort HEART: regular rate & rhythm and no murmurs and no lower extremity edema ABDOMEN:abdomen soft, non-tender and normal bowel sounds Musculoskeletal:no cyanosis of digits and no clubbing  NEURO: alert & oriented x 3 with fluent speech, no focal motor/sensory deficits  Physical Exam    LABORATORY DATA:  I have reviewed the data as listed    Latest Ref Rng & Units 07/04/2024    9:38 AM 07/05/2023   10:10 AM 06/15/2023    2:56 PM  CBC  WBC 4.0 - 10.5 K/uL 6.1  5.2  11.6   Hemoglobin 12.0 - 15.0 g/dL 88.2  88.6  87.8   Hematocrit 36.0 - 46.0 % 34.2  34.3  36.4   Platelets 150 - 400 K/uL 407  312   365         Latest Ref Rng & Units 07/04/2024    9:38 AM 07/05/2023   10:10 AM 06/15/2023    2:56 PM  CMP  Glucose 70 - 99 mg/dL 96  94  892   BUN 8 - 23 mg/dL 27  34  26   Creatinine 0.44 - 1.00 mg/dL 8.81  8.83  8.97   Sodium 135 - 145 mmol/L 127  134  135   Potassium 3.5 - 5.1 mmol/L 4.0  4.4  4.2   Chloride 98 -  111 mmol/L 92  101  102   CO2 22 - 32 mmol/L 27  26  20    Calcium  8.9 - 10.3 mg/dL 9.8  89.9  89.7   Total Protein 6.5 - 8.1 g/dL 6.9  6.9  7.1   Total Bilirubin 0.0 - 1.2 mg/dL 0.5  0.7  1.3   Alkaline Phos 38 - 126 U/L 62  70  67   AST 15 - 41 U/L 22  19  22    ALT 0 - 44 U/L 15  12  18        RADIOGRAPHIC STUDIES: I have personally reviewed the radiological images as listed and agreed with the findings in the report. No results found.    No orders of the defined types were placed in this encounter.  All questions were answered. The patient knows to call the clinic with any problems, questions or concerns. No barriers to learning was detected. The total time spent in the appointment was 15 minutes, including review of chart and various tests results, discussions about plan of care and coordination of care plan     Onita Mattock, MD 07/04/2024

## 2024-07-04 NOTE — Assessment & Plan Note (Signed)
 G2, pT1N0M0 stage I, MMR abnormal, MSI-High -She presented with change in bowel habits and hematochezia, work up showed ascending colon mass. Biopsy confirmed adenocarcinoma. Baseline CEA was normal, staging CT CAP was negative for distant metastasis.  -S/p R hemicolectomy by Dr. Dasie on 05/21/21, final path showed 2.6 cm pT1 adenocarcinoma, lymph nodes and margins were negative. Adjuvant chemotherapy was not indicated  -Molecular testing showed loss of expression in MLH1 and PMS2, MLH1 hypermethylation was present, suggesting somatic mutation. This is MSI-H tumor which has a better prognosis  -Surveillance colonoscopy 07/11/22 by Dr. Kristie revealed scattered diverticula, internal hemorrhoids, patent anastomosis and healthy appearing mucosa; 3 year recall.  -continue cancer surveillance

## 2025-07-01 ENCOUNTER — Inpatient Hospital Stay

## 2025-07-01 ENCOUNTER — Inpatient Hospital Stay: Admitting: Nurse Practitioner
